# Patient Record
Sex: Female | Born: 1985 | Race: Black or African American | Hispanic: No | Marital: Single | Smoking: Current some day smoker
Health system: Southern US, Community
[De-identification: ages and names within clinical notes are randomized; demographics above are authoritative.]

## PROBLEM LIST (undated history)

## (undated) DIAGNOSIS — Z8719 Personal history of other diseases of the digestive system: Secondary | ICD-10-CM

## (undated) DIAGNOSIS — E669 Obesity, unspecified: Secondary | ICD-10-CM

## (undated) DIAGNOSIS — Z8759 Personal history of other complications of pregnancy, childbirth and the puerperium: Secondary | ICD-10-CM

## (undated) DIAGNOSIS — J45909 Unspecified asthma, uncomplicated: Secondary | ICD-10-CM

---
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
## 2004-02-13 ENCOUNTER — Inpatient Hospital Stay (HOSPITAL_COMMUNITY): Admission: AD | Admit: 2004-02-13 | Discharge: 2004-02-14 | Payer: Self-pay | Admitting: Obstetrics and Gynecology

## 2004-04-06 ENCOUNTER — Ambulatory Visit (HOSPITAL_COMMUNITY): Admission: RE | Admit: 2004-04-06 | Discharge: 2004-04-06 | Payer: Self-pay | Admitting: *Deleted

## 2004-04-13 ENCOUNTER — Ambulatory Visit (HOSPITAL_COMMUNITY): Admission: RE | Admit: 2004-04-13 | Discharge: 2004-04-13 | Payer: Self-pay | Admitting: Obstetrics & Gynecology

## 2004-04-25 ENCOUNTER — Inpatient Hospital Stay (HOSPITAL_COMMUNITY): Admission: AD | Admit: 2004-04-25 | Discharge: 2004-04-25 | Payer: Self-pay | Admitting: *Deleted

## 2004-04-27 ENCOUNTER — Encounter: Admission: RE | Admit: 2004-04-27 | Discharge: 2004-04-27 | Payer: Self-pay | Admitting: Family Medicine

## 2004-04-28 ENCOUNTER — Ambulatory Visit (HOSPITAL_COMMUNITY): Admission: RE | Admit: 2004-04-28 | Discharge: 2004-04-28 | Payer: Self-pay | Admitting: Obstetrics and Gynecology

## 2004-05-18 ENCOUNTER — Encounter: Admission: RE | Admit: 2004-05-18 | Discharge: 2004-05-18 | Payer: Self-pay | Admitting: Family Medicine

## 2004-06-08 ENCOUNTER — Inpatient Hospital Stay (HOSPITAL_COMMUNITY): Admission: AD | Admit: 2004-06-08 | Discharge: 2004-06-08 | Payer: Self-pay | Admitting: Obstetrics & Gynecology

## 2004-06-08 ENCOUNTER — Encounter: Admission: RE | Admit: 2004-06-08 | Discharge: 2004-06-08 | Payer: Self-pay | Admitting: Family Medicine

## 2004-06-09 ENCOUNTER — Inpatient Hospital Stay (HOSPITAL_COMMUNITY): Admission: RE | Admit: 2004-06-09 | Discharge: 2004-06-09 | Payer: Self-pay | Admitting: Obstetrics & Gynecology

## 2004-06-15 ENCOUNTER — Encounter: Admission: RE | Admit: 2004-06-15 | Discharge: 2004-06-15 | Payer: Self-pay | Admitting: Family Medicine

## 2004-06-18 ENCOUNTER — Inpatient Hospital Stay (HOSPITAL_COMMUNITY): Admission: AD | Admit: 2004-06-18 | Discharge: 2004-06-21 | Payer: Self-pay | Admitting: Obstetrics & Gynecology

## 2004-07-06 ENCOUNTER — Encounter: Admission: RE | Admit: 2004-07-06 | Discharge: 2004-07-06 | Payer: Self-pay | Admitting: Family Medicine

## 2004-07-23 ENCOUNTER — Ambulatory Visit: Payer: Self-pay | Admitting: Family Medicine

## 2004-07-23 ENCOUNTER — Inpatient Hospital Stay (HOSPITAL_COMMUNITY): Admission: AD | Admit: 2004-07-23 | Discharge: 2004-07-27 | Payer: Self-pay | Admitting: Family Medicine

## 2004-07-24 ENCOUNTER — Encounter (INDEPENDENT_AMBULATORY_CARE_PROVIDER_SITE_OTHER): Payer: Self-pay | Admitting: *Deleted

## 2004-08-01 ENCOUNTER — Inpatient Hospital Stay (HOSPITAL_COMMUNITY): Admission: AD | Admit: 2004-08-01 | Discharge: 2004-08-01 | Payer: Self-pay | Admitting: *Deleted

## 2005-06-17 ENCOUNTER — Emergency Department (HOSPITAL_COMMUNITY): Admission: EM | Admit: 2005-06-17 | Discharge: 2005-06-17 | Payer: Self-pay | Admitting: Emergency Medicine

## 2005-10-10 ENCOUNTER — Emergency Department (HOSPITAL_COMMUNITY): Admission: EM | Admit: 2005-10-10 | Discharge: 2005-10-10 | Payer: Self-pay | Admitting: Emergency Medicine

## 2007-02-11 ENCOUNTER — Emergency Department (HOSPITAL_COMMUNITY): Admission: EM | Admit: 2007-02-11 | Discharge: 2007-02-11 | Payer: Self-pay | Admitting: Emergency Medicine

## 2007-03-04 ENCOUNTER — Emergency Department (HOSPITAL_COMMUNITY): Admission: EM | Admit: 2007-03-04 | Discharge: 2007-03-04 | Payer: Self-pay | Admitting: Emergency Medicine

## 2007-06-02 ENCOUNTER — Inpatient Hospital Stay (HOSPITAL_COMMUNITY): Admission: AD | Admit: 2007-06-02 | Discharge: 2007-06-02 | Payer: Self-pay | Admitting: Family Medicine

## 2008-09-28 ENCOUNTER — Inpatient Hospital Stay (HOSPITAL_COMMUNITY): Admission: AD | Admit: 2008-09-28 | Discharge: 2008-09-28 | Payer: Self-pay | Admitting: Obstetrics & Gynecology

## 2008-11-16 ENCOUNTER — Ambulatory Visit (HOSPITAL_COMMUNITY): Admission: RE | Admit: 2008-11-16 | Discharge: 2008-11-16 | Payer: Self-pay | Admitting: Family Medicine

## 2008-11-18 ENCOUNTER — Ambulatory Visit (HOSPITAL_COMMUNITY): Admission: RE | Admit: 2008-11-18 | Discharge: 2008-11-18 | Payer: Self-pay | Admitting: Family Medicine

## 2008-11-18 ENCOUNTER — Ambulatory Visit: Payer: Self-pay | Admitting: Family Medicine

## 2008-11-25 ENCOUNTER — Ambulatory Visit (HOSPITAL_COMMUNITY): Admission: RE | Admit: 2008-11-25 | Discharge: 2008-11-25 | Payer: Self-pay | Admitting: Physician Assistant

## 2008-11-25 ENCOUNTER — Ambulatory Visit: Payer: Self-pay | Admitting: Obstetrics & Gynecology

## 2008-12-02 ENCOUNTER — Ambulatory Visit: Payer: Self-pay | Admitting: Obstetrics & Gynecology

## 2008-12-23 ENCOUNTER — Ambulatory Visit (HOSPITAL_COMMUNITY): Admission: RE | Admit: 2008-12-23 | Discharge: 2008-12-23 | Payer: Self-pay | Admitting: Family Medicine

## 2008-12-23 ENCOUNTER — Ambulatory Visit: Payer: Self-pay | Admitting: Family Medicine

## 2008-12-31 ENCOUNTER — Ambulatory Visit (HOSPITAL_COMMUNITY): Admission: RE | Admit: 2008-12-31 | Discharge: 2008-12-31 | Payer: Self-pay | Admitting: Family Medicine

## 2009-01-06 ENCOUNTER — Ambulatory Visit: Payer: Self-pay | Admitting: Family Medicine

## 2009-01-20 ENCOUNTER — Ambulatory Visit (HOSPITAL_COMMUNITY): Admission: RE | Admit: 2009-01-20 | Discharge: 2009-01-20 | Payer: Self-pay | Admitting: Family Medicine

## 2009-01-20 ENCOUNTER — Ambulatory Visit: Payer: Self-pay | Admitting: Family Medicine

## 2009-01-24 ENCOUNTER — Inpatient Hospital Stay (HOSPITAL_COMMUNITY): Admission: AD | Admit: 2009-01-24 | Discharge: 2009-01-24 | Payer: Self-pay | Admitting: Obstetrics & Gynecology

## 2009-02-03 ENCOUNTER — Ambulatory Visit: Payer: Self-pay | Admitting: Family Medicine

## 2009-02-09 ENCOUNTER — Inpatient Hospital Stay (HOSPITAL_COMMUNITY): Admission: AD | Admit: 2009-02-09 | Discharge: 2009-02-28 | Payer: Self-pay | Admitting: Obstetrics & Gynecology

## 2009-02-09 ENCOUNTER — Ambulatory Visit: Payer: Self-pay | Admitting: Family Medicine

## 2009-02-10 ENCOUNTER — Encounter: Payer: Self-pay | Admitting: Family Medicine

## 2009-02-24 ENCOUNTER — Encounter: Payer: Self-pay | Admitting: *Deleted

## 2009-02-27 ENCOUNTER — Encounter: Payer: Self-pay | Admitting: Obstetrics & Gynecology

## 2009-05-13 ENCOUNTER — Emergency Department (HOSPITAL_COMMUNITY): Admission: EM | Admit: 2009-05-13 | Discharge: 2009-05-13 | Payer: Self-pay | Admitting: Emergency Medicine

## 2009-09-14 ENCOUNTER — Inpatient Hospital Stay (HOSPITAL_COMMUNITY): Admission: AD | Admit: 2009-09-14 | Discharge: 2009-09-14 | Payer: Self-pay | Admitting: Obstetrics and Gynecology

## 2009-10-17 IMAGING — US US PELVIS COMPLETE MODIFY
1 series · 14 of 25 positions shown · non-contrast
Comparison: None

CLINICAL DATA: Pelvic pain and abnormal uterine bleeding.

TRANSABDOMINAL AND TRANSVAGINAL ULTRASOUND OF PELVIS
TECHNIQUE: Both transabdominal and transvaginal ultrasound
examinations of the pelvis were performed including evaluation of
the uterus, ovaries, adnexal regions, and pelvic cul-de-sac.

[Series 1: us transvaginal non-ob · 0.27mm/px · 27 acquisitions, 14 frames shown]
[im 1/27]
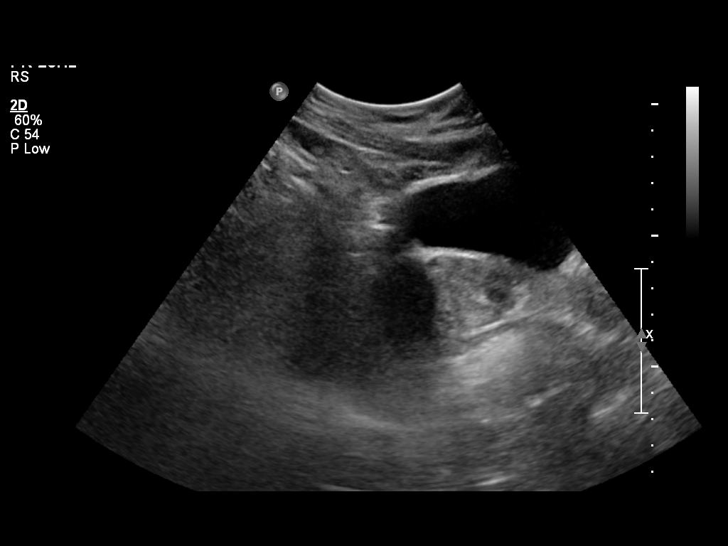
[im 3/27]
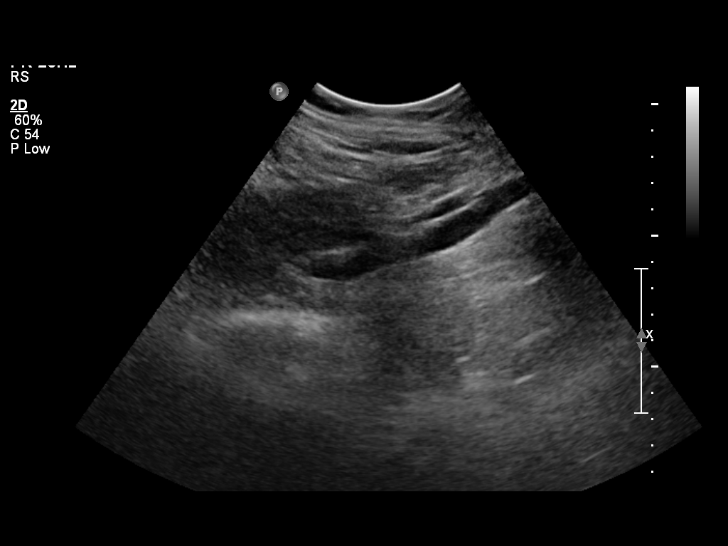
[im 5/27]
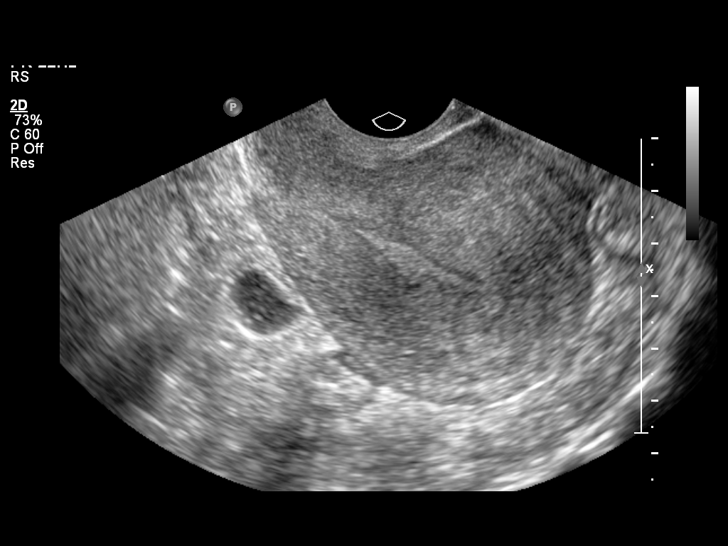
[im 7/27]
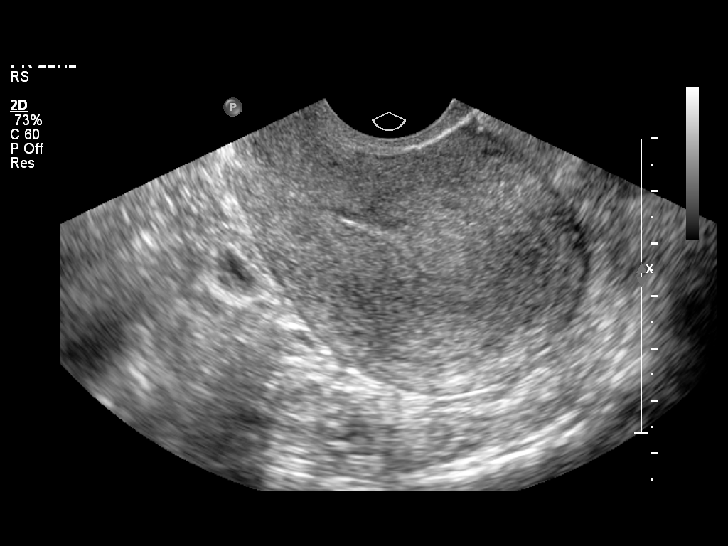
[im 9/27]
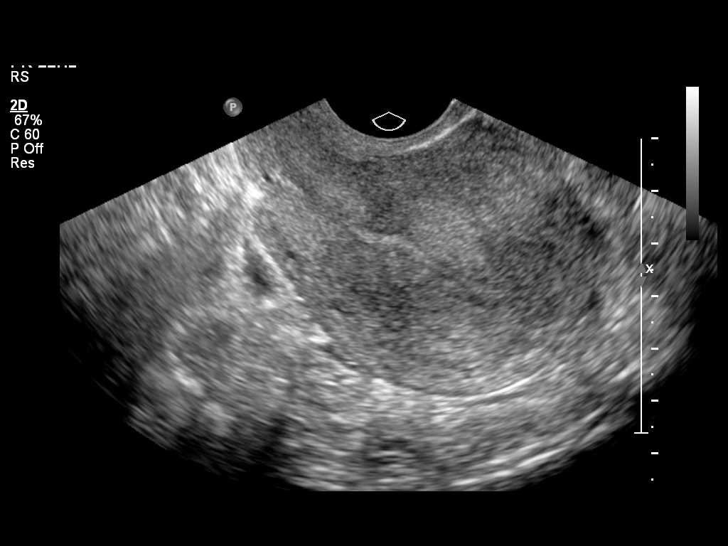
[im 10/27]
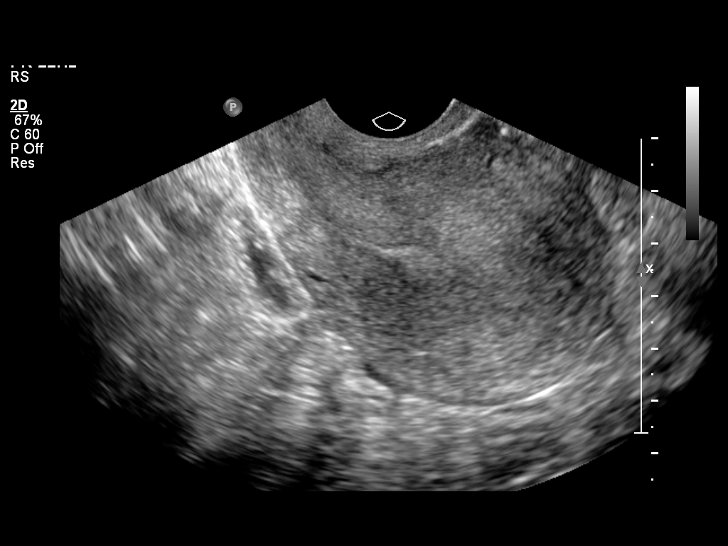
[im 12/27]
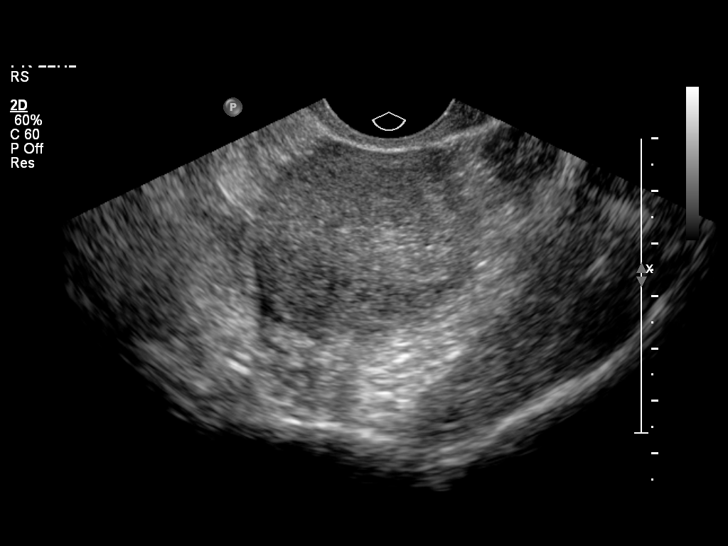
[im 15/27]
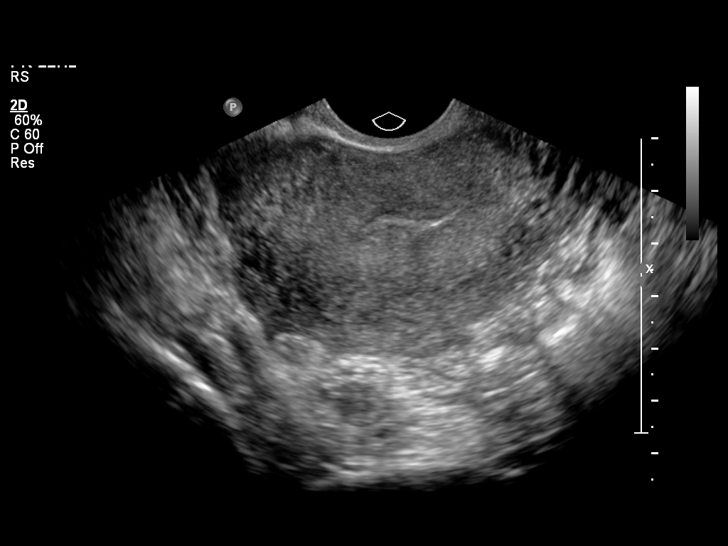
[im 17/27]
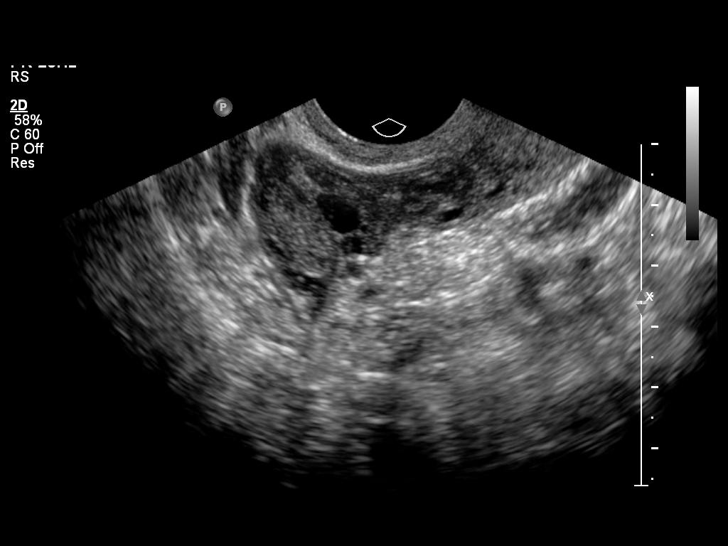
[im 18/27]
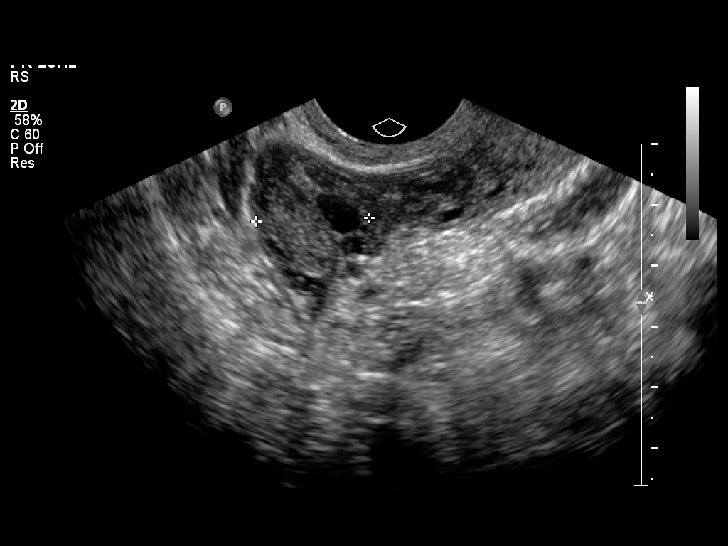
[im 20/27]
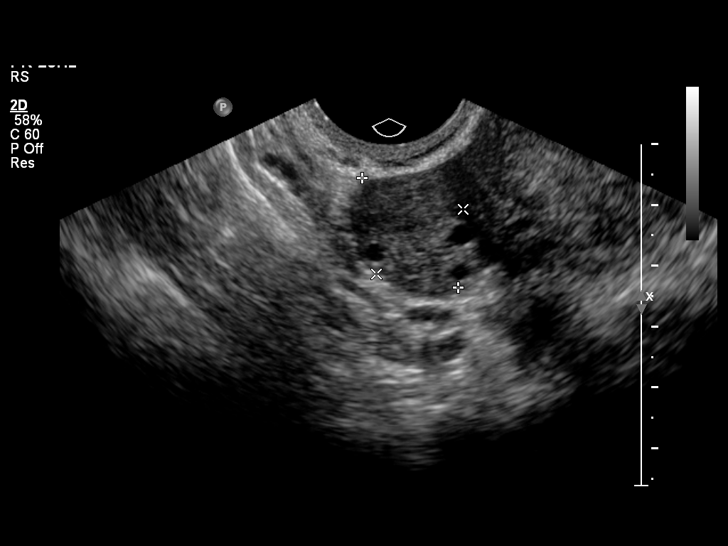
[im 22/27]
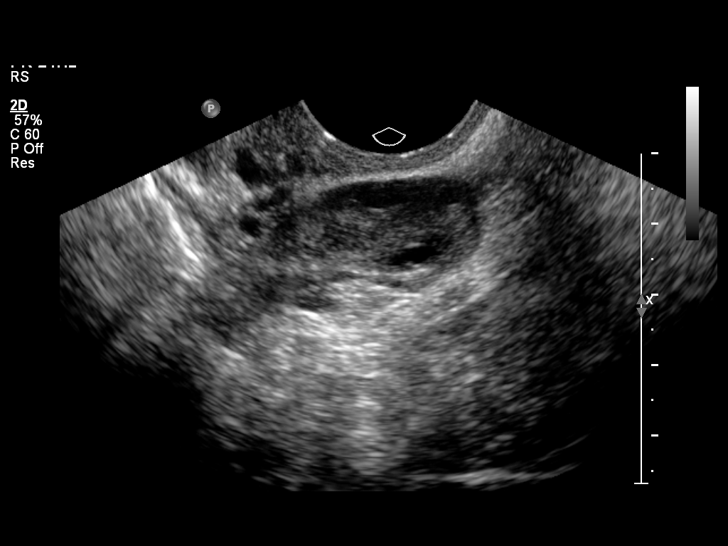
[im 24/27]
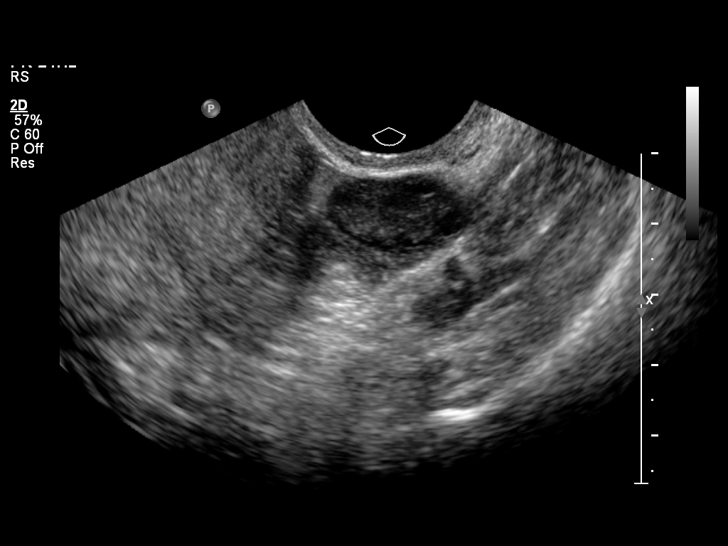
[im 27/27]
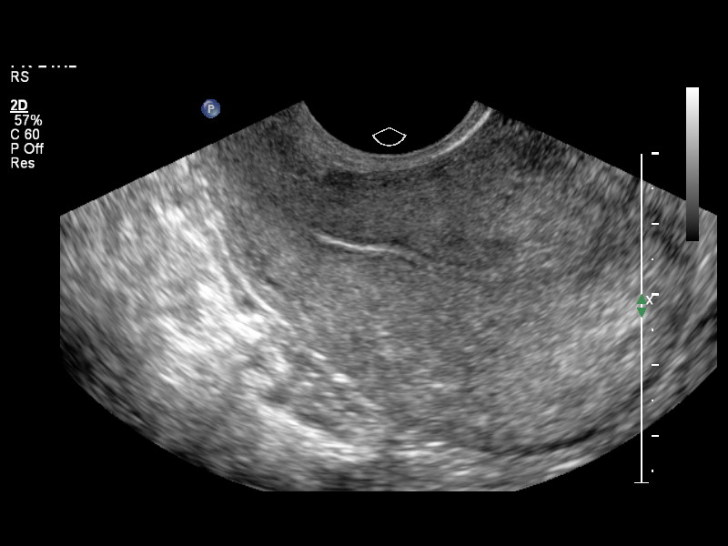

[14 of 25 positions shown; findings below may reference images not displayed]

FINDINGS: Uterus measures 7.3 x 5.2 x 6.2 cm. Uterus is retroverted.  No
fibroids or other uterine masses identified.

Endometrium measures 2 mm in thickness.  Within normal limits in
appearance.

Right Ovary measures 2.4 x 1.8 x 1.9 cm. Normal appearance.

Left Ovary measures 3.2 x 1.6 x 2.0 cm.  Normal appearance.

Other Findings:  No adnexal mass or free fluid identified.
IMPRESSION: Normal study.  No evidence of pelvic mass or other significant
abnormality.

## 2009-12-28 ENCOUNTER — Emergency Department (HOSPITAL_COMMUNITY): Admission: EM | Admit: 2009-12-28 | Discharge: 2009-12-28 | Payer: Self-pay | Admitting: Emergency Medicine

## 2010-05-02 ENCOUNTER — Other Ambulatory Visit: Admission: RE | Admit: 2010-05-02 | Discharge: 2010-05-02 | Payer: Self-pay | Admitting: Radiology

## 2010-09-27 ENCOUNTER — Inpatient Hospital Stay (HOSPITAL_COMMUNITY): Admission: AD | Admit: 2010-09-27 | Discharge: 2010-09-27 | Payer: Self-pay | Admitting: Obstetrics and Gynecology

## 2010-09-27 ENCOUNTER — Ambulatory Visit: Payer: Self-pay | Admitting: Obstetrics and Gynecology

## 2010-11-07 ENCOUNTER — Emergency Department (HOSPITAL_COMMUNITY)
Admission: EM | Admit: 2010-11-07 | Discharge: 2010-11-07 | Payer: Self-pay | Source: Home / Self Care | Admitting: Emergency Medicine

## 2011-01-07 ENCOUNTER — Encounter: Payer: Self-pay | Admitting: Family Medicine

## 2011-02-28 LAB — URINE MICROSCOPIC-ADD ON

## 2011-02-28 LAB — URINALYSIS, ROUTINE W REFLEX MICROSCOPIC
Ketones, ur: NEGATIVE mg/dL
Nitrite: NEGATIVE
Protein, ur: NEGATIVE mg/dL
Specific Gravity, Urine: 1.02 (ref 1.005–1.030)
Urobilinogen, UA: 1 mg/dL (ref 0.0–1.0)
pH: 6.5 (ref 5.0–8.0)

## 2011-02-28 LAB — URINE CULTURE

## 2011-02-28 LAB — GC/CHLAMYDIA PROBE AMP, GENITAL: GC Probe Amp, Genital: NEGATIVE

## 2011-02-28 LAB — WET PREP, GENITAL: Trich, Wet Prep: NONE SEEN

## 2011-03-04 LAB — URINALYSIS, ROUTINE W REFLEX MICROSCOPIC
Glucose, UA: NEGATIVE mg/dL
Hgb urine dipstick: NEGATIVE
Protein, ur: NEGATIVE mg/dL
pH: 7 (ref 5.0–8.0)

## 2011-03-04 LAB — URINE MICROSCOPIC-ADD ON

## 2011-03-05 ENCOUNTER — Emergency Department (HOSPITAL_COMMUNITY)
Admission: EM | Admit: 2011-03-05 | Discharge: 2011-03-06 | Disposition: A | Discharge status: Home / Self Care | Payer: Medicaid Other | Attending: Emergency Medicine | Admitting: Emergency Medicine

## 2011-03-05 DIAGNOSIS — N6009 Solitary cyst of unspecified breast: Secondary | ICD-10-CM | POA: Insufficient documentation

## 2011-03-05 DIAGNOSIS — N644 Mastodynia: Secondary | ICD-10-CM | POA: Insufficient documentation

## 2011-03-05 LAB — GRAM STAIN

## 2011-03-07 ENCOUNTER — Emergency Department (HOSPITAL_COMMUNITY)
Admission: EM | Admit: 2011-03-07 | Discharge: 2011-03-08 | Disposition: A | Discharge status: Home / Self Care | Payer: Medicaid Other | Attending: Emergency Medicine | Admitting: Emergency Medicine

## 2011-03-07 DIAGNOSIS — N61 Mastitis without abscess: Secondary | ICD-10-CM | POA: Insufficient documentation

## 2011-03-07 DIAGNOSIS — N644 Mastodynia: Secondary | ICD-10-CM | POA: Insufficient documentation

## 2011-03-17 ENCOUNTER — Emergency Department (HOSPITAL_COMMUNITY)
Admission: EM | Admit: 2011-03-17 | Discharge: 2011-03-17 | Disposition: A | Discharge status: Home / Self Care | Payer: Medicaid Other | Attending: Emergency Medicine | Admitting: Emergency Medicine

## 2011-03-17 DIAGNOSIS — J029 Acute pharyngitis, unspecified: Secondary | ICD-10-CM | POA: Insufficient documentation

## 2011-03-17 DIAGNOSIS — O99891 Other specified diseases and conditions complicating pregnancy: Secondary | ICD-10-CM | POA: Insufficient documentation

## 2011-03-17 LAB — RAPID STREP SCREEN (MED CTR MEBANE ONLY): Streptococcus, Group A Screen (Direct): NEGATIVE

## 2011-03-18 LAB — STREP A DNA PROBE: Group A Strep Probe: NEGATIVE

## 2011-03-23 LAB — URINALYSIS, ROUTINE W REFLEX MICROSCOPIC
Glucose, UA: NEGATIVE mg/dL
Ketones, ur: NEGATIVE mg/dL
Leukocytes, UA: NEGATIVE
Protein, ur: NEGATIVE mg/dL
pH: 6.5 (ref 5.0–8.0)

## 2011-03-23 LAB — GC/CHLAMYDIA PROBE AMP, GENITAL
Chlamydia, DNA Probe: NEGATIVE
GC Probe Amp, Genital: NEGATIVE

## 2011-03-23 LAB — CBC
HCT: 44.4 % (ref 36.0–46.0)
Hemoglobin: 14.8 g/dL (ref 12.0–15.0)
Platelets: 295 10*3/uL (ref 150–400)
RDW: 14.6 % (ref 11.5–15.5)
WBC: 8.9 10*3/uL (ref 4.0–10.5)

## 2011-03-23 LAB — WET PREP, GENITAL
Trich, Wet Prep: NONE SEEN
Yeast Wet Prep HPF POC: NONE SEEN

## 2011-03-23 LAB — URINE MICROSCOPIC-ADD ON

## 2011-03-25 ENCOUNTER — Inpatient Hospital Stay (HOSPITAL_COMMUNITY)
Admission: AD | Admit: 2011-03-25 | Discharge: 2011-03-28 | DRG: 781 | Disposition: A | Discharge status: Home / Self Care | Payer: Medicaid Other | Source: Ambulatory Visit | Attending: Obstetrics and Gynecology | Admitting: Obstetrics and Gynecology

## 2011-03-25 DIAGNOSIS — O99340 Other mental disorders complicating pregnancy, unspecified trimester: Secondary | ICD-10-CM | POA: Diagnosis present

## 2011-03-25 DIAGNOSIS — O34219 Maternal care for unspecified type scar from previous cesarean delivery: Secondary | ICD-10-CM | POA: Diagnosis present

## 2011-03-25 DIAGNOSIS — O368390 Maternal care for abnormalities of the fetal heart rate or rhythm, unspecified trimester, not applicable or unspecified: Secondary | ICD-10-CM | POA: Diagnosis present

## 2011-03-25 DIAGNOSIS — O4690 Antepartum hemorrhage, unspecified, unspecified trimester: Principal | ICD-10-CM | POA: Diagnosis present

## 2011-03-25 DIAGNOSIS — O4700 False labor before 37 completed weeks of gestation, unspecified trimester: Secondary | ICD-10-CM | POA: Diagnosis present

## 2011-03-25 DIAGNOSIS — F1410 Cocaine abuse, uncomplicated: Secondary | ICD-10-CM | POA: Diagnosis present

## 2011-03-25 LAB — RAPID URINE DRUG SCREEN, HOSP PERFORMED
Barbiturates: NOT DETECTED
Benzodiazepines: NOT DETECTED

## 2011-03-25 LAB — COMPREHENSIVE METABOLIC PANEL
BUN: 2 mg/dL — ABNORMAL LOW (ref 6–23)
CO2: 20 mEq/L (ref 19–32)
Calcium: 9.2 mg/dL (ref 8.4–10.5)
Chloride: 107 mEq/L (ref 96–112)
Creatinine, Ser: 0.49 mg/dL (ref 0.4–1.2)
GFR calc non Af Amer: >60 mL/min (ref >60)
Glucose, Bld: 91 mg/dL (ref 70–99)
Total Bilirubin: 0.2 mg/dL — ABNORMAL LOW (ref 0.3–1.2)

## 2011-03-25 LAB — WET PREP, GENITAL
Clue Cells Wet Prep HPF POC: NONE SEEN
Trich, Wet Prep: NONE SEEN

## 2011-03-25 LAB — TYPE AND SCREEN
ABO/RH(D): B POS
Antibody Screen: NEGATIVE

## 2011-03-25 LAB — URINALYSIS, ROUTINE W REFLEX MICROSCOPIC
Ketones, ur: NEGATIVE mg/dL
Nitrite: NEGATIVE
Protein, ur: NEGATIVE mg/dL

## 2011-03-25 LAB — CBC
Hemoglobin: 12.3 g/dL (ref 12.0–15.0)
MCH: 32.3 pg (ref 26.0–34.0)
MCHC: 34.2 g/dL (ref 30.0–36.0)
Platelets: 282 10*3/uL (ref 150–400)
RBC: 3.81 MIL/uL — ABNORMAL LOW (ref 3.87–5.11)

## 2011-03-25 LAB — URINE MICROSCOPIC-ADD ON

## 2011-03-26 ENCOUNTER — Inpatient Hospital Stay (HOSPITAL_COMMUNITY): Payer: Medicaid Other

## 2011-03-26 LAB — GC/CHLAMYDIA PROBE AMP, GENITAL
Chlamydia, DNA Probe: NEGATIVE
GC Probe Amp, Genital: NEGATIVE

## 2011-03-27 LAB — POCT PREGNANCY, URINE: Preg Test, Ur: NEGATIVE

## 2011-03-27 LAB — URINALYSIS, ROUTINE W REFLEX MICROSCOPIC
Bilirubin Urine: NEGATIVE
Glucose, UA: NEGATIVE mg/dL
Leukocytes, UA: NEGATIVE
Nitrite: NEGATIVE
Protein, ur: 30 mg/dL — AB
Specific Gravity, Urine: 1.02 (ref 1.005–1.030)
Urobilinogen, UA: 0.2 mg/dL (ref 0.0–1.0)
pH: 6 (ref 5.0–8.0)

## 2011-03-27 LAB — DIFFERENTIAL
Basophils Absolute: 0 10*3/uL (ref 0.0–0.1)
Lymphocytes Relative: 7 % — ABNORMAL LOW (ref 12–46)
Neutro Abs: 11 10*3/uL — ABNORMAL HIGH (ref 1.7–7.7)
Neutrophils Relative %: 90 % — ABNORMAL HIGH (ref 43–77)

## 2011-03-27 LAB — COMPREHENSIVE METABOLIC PANEL
BUN: 7 mg/dL (ref 6–23)
CO2: 18 mEq/L — ABNORMAL LOW (ref 19–32)
Chloride: 109 mEq/L (ref 96–112)
Creatinine, Ser: 0.9 mg/dL (ref 0.4–1.2)
GFR calc non Af Amer: >60 mL/min (ref >60)
Total Bilirubin: 1 mg/dL (ref 0.3–1.2)

## 2011-03-27 LAB — LIPASE, BLOOD: Lipase: 22 U/L (ref 11–59)

## 2011-03-27 LAB — CBC
HCT: 46.2 % — ABNORMAL HIGH (ref 36.0–46.0)
MCHC: 33.5 g/dL (ref 30.0–36.0)
MCV: 95.4 fL (ref 78.0–100.0)
Platelets: 297 10*3/uL (ref 150–400)
RBC: 4.84 MIL/uL (ref 3.87–5.11)
WBC: 12.2 10*3/uL — ABNORMAL HIGH (ref 4.0–10.5)

## 2011-03-27 LAB — URINE MICROSCOPIC-ADD ON

## 2011-03-28 LAB — STREP B DNA PROBE

## 2011-03-29 ENCOUNTER — Inpatient Hospital Stay (HOSPITAL_COMMUNITY)
Admission: AD | Admit: 2011-03-29 | Discharge: 2011-03-31 | DRG: 774 | Disposition: A | Discharge status: Home / Self Care | Payer: Medicaid Other | Source: Ambulatory Visit | Attending: Obstetrics and Gynecology | Admitting: Obstetrics and Gynecology

## 2011-03-29 ENCOUNTER — Other Ambulatory Visit: Payer: Self-pay | Admitting: Obstetrics and Gynecology

## 2011-03-29 DIAGNOSIS — O4590 Premature separation of placenta, unspecified, unspecified trimester: Principal | ICD-10-CM | POA: Diagnosis present

## 2011-03-29 DIAGNOSIS — O34219 Maternal care for unspecified type scar from previous cesarean delivery: Secondary | ICD-10-CM | POA: Diagnosis present

## 2011-03-29 LAB — GLUCOSE, CAPILLARY
Glucose-Capillary: 104 mg/dL — ABNORMAL HIGH (ref 70–99)
Glucose-Capillary: 105 mg/dL — ABNORMAL HIGH (ref 70–99)
Glucose-Capillary: 107 mg/dL — ABNORMAL HIGH (ref 70–99)
Glucose-Capillary: 109 mg/dL — ABNORMAL HIGH (ref 70–99)
Glucose-Capillary: 113 mg/dL — ABNORMAL HIGH (ref 70–99)
Glucose-Capillary: 114 mg/dL — ABNORMAL HIGH (ref 70–99)
Glucose-Capillary: 115 mg/dL — ABNORMAL HIGH (ref 70–99)
Glucose-Capillary: 116 mg/dL — ABNORMAL HIGH (ref 70–99)
Glucose-Capillary: 127 mg/dL — ABNORMAL HIGH (ref 70–99)
Glucose-Capillary: 131 mg/dL — ABNORMAL HIGH (ref 70–99)
Glucose-Capillary: 139 mg/dL — ABNORMAL HIGH (ref 70–99)
Glucose-Capillary: 143 mg/dL — ABNORMAL HIGH (ref 70–99)
Glucose-Capillary: 60 mg/dL — ABNORMAL LOW (ref 70–99)
Glucose-Capillary: 77 mg/dL (ref 70–99)
Glucose-Capillary: 81 mg/dL (ref 70–99)
Glucose-Capillary: 82 mg/dL (ref 70–99)
Glucose-Capillary: 86 mg/dL (ref 70–99)
Glucose-Capillary: 86 mg/dL (ref 70–99)
Glucose-Capillary: 93 mg/dL (ref 70–99)
Glucose-Capillary: 94 mg/dL (ref 70–99)
Glucose-Capillary: 97 mg/dL (ref 70–99)

## 2011-03-29 LAB — DIFFERENTIAL
Basophils Absolute: 0 10*3/uL (ref 0.0–0.1)
Eosinophils Absolute: 0.2 10*3/uL (ref 0.0–0.7)
Lymphocytes Relative: 8 % — ABNORMAL LOW (ref 12–46)
Monocytes Relative: 3 % (ref 3–12)
Neutro Abs: 21.2 10*3/uL — ABNORMAL HIGH (ref 1.7–7.7)
Neutrophils Relative %: 88 % — ABNORMAL HIGH (ref 43–77)

## 2011-03-29 LAB — URINALYSIS, ROUTINE W REFLEX MICROSCOPIC
Glucose, UA: NEGATIVE mg/dL
Protein, ur: NEGATIVE mg/dL
Specific Gravity, Urine: 1.015 (ref 1.005–1.030)
Urobilinogen, UA: 0.2 mg/dL (ref 0.0–1.0)

## 2011-03-29 LAB — URINALYSIS, MICROSCOPIC ONLY
Bilirubin Urine: NEGATIVE
Ketones, ur: NEGATIVE mg/dL
Leukocytes, UA: NEGATIVE
Nitrite: NEGATIVE
Protein, ur: NEGATIVE mg/dL
Urobilinogen, UA: 0.2 mg/dL (ref 0.0–1.0)

## 2011-03-29 LAB — CBC
HCT: 36.5 % (ref 36.0–46.0)
Hemoglobin: 13.1 g/dL (ref 12.0–15.0)
MCH: 32.5 pg (ref 26.0–34.0)
MCHC: 33.9 g/dL (ref 30.0–36.0)
MCHC: 34.1 g/dL (ref 30.0–36.0)
MCHC: 34 g/dL (ref 30.0–36.0)
MCV: 95.5 fL (ref 78.0–100.0)
Platelets: 287 10*3/uL (ref 150–400)
RBC: 3.67 MIL/uL — ABNORMAL LOW (ref 3.87–5.11)
RDW: 14.1 % (ref 11.5–15.5)
RDW: 14.3 % (ref 11.5–15.5)
RDW: 13.6 % (ref 11.5–15.5)

## 2011-03-29 LAB — URINALYSIS, DIPSTICK ONLY
Bilirubin Urine: NEGATIVE
Leukocytes, UA: NEGATIVE
Nitrite: NEGATIVE
Specific Gravity, Urine: 1.025 (ref 1.005–1.030)
Urobilinogen, UA: 0.2 mg/dL (ref 0.0–1.0)
pH: 7 (ref 5.0–8.0)

## 2011-03-29 LAB — URINE MICROSCOPIC-ADD ON

## 2011-03-29 LAB — RAPID URINE DRUG SCREEN, HOSP PERFORMED: Tetrahydrocannabinol: NOT DETECTED

## 2011-03-30 LAB — CBC
Hemoglobin: 11.3 g/dL — ABNORMAL LOW (ref 12.0–15.0)
MCH: 32 pg (ref 26.0–34.0)
RBC: 3.53 MIL/uL — ABNORMAL LOW (ref 3.87–5.11)

## 2011-03-30 LAB — RPR: RPR Ser Ql: NONREACTIVE

## 2011-04-02 LAB — POCT URINALYSIS DIP (DEVICE)
Bilirubin Urine: NEGATIVE
Bilirubin Urine: NEGATIVE
Glucose, UA: NEGATIVE mg/dL
Hgb urine dipstick: NEGATIVE
Hgb urine dipstick: NEGATIVE
Ketones, ur: NEGATIVE mg/dL
Specific Gravity, Urine: 1.02 (ref 1.005–1.030)
Specific Gravity, Urine: 1.015 (ref 1.005–1.030)
pH: 7 (ref 5.0–8.0)

## 2011-04-02 NOTE — Discharge Summary (Signed)
NAMEJARETZY, [name]             ACCOUNT NO.:  [id number]  MEDICAL RECORD NO.:  [id number]           PATIENT TYPE:  I  LOCATION:  9305                          [facility]  PHYSICIAN:  Huel Cote, M.D. DATE OF BIRTH:  [birth year]  DATE OF ADMISSION:  03/29/2011 DATE OF DISCHARGE:  03/31/2011                              DISCHARGE SUMMARY   DISCHARGE DIAGNOSES: 1. Preterm pregnancy at 32-3/7 weeks. 2. Status post vaginal bleeding partial abruption prior to this     admission. 3. Active labor. 4. Vaginal birth after cesarean section.  DISCHARGE MEDICATIONS: 1. Motrin 600 mg. 2. Percocet 1-2 tablets p.o. every 4 h. p.r.n.  DISCHARGE FOLLOWUP:  The patient is to follow up in 6 weeks for her full postpartum exam and a probable Mirena placement.  HOSPITAL COURSE:  The patient is a 25 year old G3, P0-2-0-1 who is admitted at 32-3/7 weeks' gestation in active labor.  The patient had been admitted prior to this was a vaginal bleeding partial abruption which had stabilized and had been discharged home.  She presented to the office today with contractions and some cervical change to 3-cm and shortly thereafter began to have an increase in her contractions and cervix progressed to 5-6 cm.  She was given the option of a vaginal birth after C-section versus a repeat C-section and desired to proceed with a VBAC which she had successfully done prior.  The patient did have a positive drug screen this pregnancy for cocaine which she stated was from a borrowed cigarette.  PAST OBSTETRICAL HISTORY:  Significant for severe preeclampsia and delivery at 32 weeks.  This was a cesarean section of 3-pound, 9- ounce infant in 2005 for a nonreassuring fetal heart rate.  In 2010, she had a 26-week delivery after having rupture of membranes at 12 weeks' gestation.  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  Her prenatal care this pregnancy was significant for the prior early deliveries.  She also had a history of bipolar disorder and depression, multiple pelvic infections including gonorrhea, Chlamydia and Trichomonas.  This pregnancy only positive for Trichomonas.  She had a history of cocaine and marijuana abuse and did test positive for cocaine the week prior to admission for delivery.  She had history of smoking.  PAST MEDICAL HISTORY:  Significant for the bipolar and depression and history of a recurring left breast abscess.  PAST SURGICAL HISTORY:  Low transverse C-section and I and D of a breast abscess.  PAST OBSTETRICAL HISTORY:  She had a C-section at 32 weeks as stated and a successful VBAC of a 26-week or thereafter.  PAST GYNECOLOGIC HISTORY:  History of abnormal Pap smear and low-grade SIL changes.  She also had a history of multiple pelvic infections with Trichomonas only noted this pregnancy.  MEDICATIONS:  None.  ALLERGIES:  None.  SOCIAL HISTORY:  Positive for cocaine and marijuana and a smoker.  FAMILY HISTORY:  Bipolar disorder and she herself has this as well.  PRENATAL LABORATORY DATA:  B positive, antibody  negative, RPR nonreactive, rubella immune, hepatitis B surface antigen negative, HIV negative, GC negative, Chlamydia negative.  Hemoglobin electrophoresis normal.  Initial urine drug screen was negative.  However, she did have a positive cocaine screen later.  First trimester screen normal. Glucola 128.  Shortly after admission, the patient received ampicillin for her group B strep prophylaxis given a preterm delivery.  She progressed quickly to complete dilation and delivered a viable female infant, Apgars were 8 and 8, weight was 4 pounds 9 ounces.  Placenta was expressed intact.  There is no bilateral labial lacerations which she was repaired of the left for hemostasis and the first-degree  perineal laceration repaired with 3-0 Vicryl Rapide.  She was then admitted for routine postpartum care.  Baby went to NICU, but was doing well on room air.  Her repeat drug screen on this admission was negative.  Her discharge hemoglobin was 11.3.  On postpartum day #2, she was doing quite well and the pain was controlled with Motrin and Percocet.  She was felt stable for discharge home and was discharged to home with followup planned in the office in 6 weeks and a Mirena placement.  She had normal lochia and her fundus was firm on discharge and the baby was stable at the NICU.     Huel Cote, M.D.     KR/MEDQ  D:  03/31/2011  T:  04/01/2011  Job:  [id number]  Electronically Signed by Huel Cote M.D. on 04/01/2011 10:47:49 AM

## 2011-04-03 LAB — POCT URINALYSIS DIP (DEVICE)
Hgb urine dipstick: NEGATIVE
Ketones, ur: NEGATIVE mg/dL
Protein, ur: 30 mg/dL — AB
Protein, ur: 30 mg/dL — AB
Specific Gravity, Urine: 1.02 (ref 1.005–1.030)
Specific Gravity, Urine: 1.02 (ref 1.005–1.030)
Urobilinogen, UA: 0.2 mg/dL (ref 0.0–1.0)
pH: 7 (ref 5.0–8.0)

## 2011-04-03 LAB — STREP B DNA PROBE

## 2011-04-04 NOTE — Discharge Summary (Signed)
[name], [name]             ACCOUNT NO.:  [id number]  MEDICAL RECORD NO.:  [id number]           PATIENT TYPE:  I  LOCATION:  9151                          [facility]  PHYSICIAN:  Sherron Monday, MD        DATE OF BIRTH:  08-29-1986  DATE OF ADMISSION:  03/25/2011 DATE OF DISCHARGE:  03/28/2011                              DISCHARGE SUMMARY   ADMITTING DIAGNOSES:  Intrauterine pregnancy at 31 plus weeks with vaginal bleeding and cervical dilatation.  DISCHARGE DIAGNOSES:  Intrauterine pregnancy at 31 plus weeks with vaginal bleeding and cervical dilatation.  HISTORY OF PRESENT ILLNESS:  A 25 year old G3, P 0-2-0-1 at 31-6/7 by 8- week ultrasound with EDC of May 21, 2011, presents with complaint of vaginal bleeding, questionable contractions.  Evaluation in the MAU revealed cervix exam to be 3 cm dilated, some bloody discharge and regular contractions.  On prenatal care, she has had nausea and vomiting of pregnancy and nipple abscess, but please see her prenatal records for complete history.  PAST MEDICAL HISTORY:  Significant for LGSIL bipolar, Chlamydia, condylomata, gonorrhea and Trichomonas.  SURGICAL HISTORY:  Significant for low transverse cesarean section.  PAST OBSTETRIC AND GYNECOLOGIC HISTORY:  G1 in 2005, low transverse cesarean section, 32 weeks, 3 pounds 9 ounces, severe preeclampsia, small for gestational age and nonreassuring fetal heart tracing.  G2 was in 2010.  VBAC at 26 weeks after PPROM at 12 weeks.  This infant died at 3 hours of life.  MEDICATIONS:  Zofran.  ALLERGIES:  No known drug allergies.  SOCIAL HISTORY:  Denies alcohol or drug use.  She does smoke and had a cocaine-laced cigarette at a party.  PRENATAL LABORATORY FINDINGS:  She is B positive, antibody screen negative, rubella immune, hepatitis B surface antigen negative.  First trimester screen within normal limits.  AFP within normal limits. Glucola 128.  On admission, exam was  benign with moderate blood in her vault and the cervix 3 cm dilated, 50% effaced at a -2 station.  She was admitted, put on magnesium for CP prophylaxis.  She got betamethasone.  The decision was made if her labor progressed to proceed with cesarean section.  On hospital day #2, she had an occasional late deceleration with rare contraction, positive UDS after laced cigarette.  She had a social work consult, in which they discussed plan of care given the history of the positive UDS, continued to be monitored but she had again late deceleration.  An ultrasound was performed revealing no obvious abruption and good growth.  The patient continued to be monitored with occasional, likely lates after was thought to be contractions.  The patient decided that she would like to consider a VBAC given her increased vaginal dilatation.  She was given Procardia for some contractions overnight.  Protonix for GERD.  She will be discharged to home with routine discharge instructions and numbers to call with any prescriptions as well as a prescription for Protonix.  She was written to be excused from work.  Overall her sugars been very reassuring in the 140s and reactive with rare contractions.  She will be discharged to  home with routine discharge instructions and numbers to call with any questions or problems.  Follow up Monday in the office.     Sherron Monday, MD     JB/MEDQ  D:  03/28/2011  T:  03/29/2011  Job:  [id number]  Electronically Signed by Sherron Monday MD on 04/04/2011 04:32:13 PM

## 2011-05-01 NOTE — H&P (Signed)
NAMEVERNIE, [name]             ACCOUNT NO.:  [id number]   MEDICAL RECORD NO.:  [id number]          PATIENT TYPE:  INP   LOCATION:                                [facility]   PHYSICIAN:  Norton Blizzard, MD    DATE OF BIRTH:  Nov 19, 1986   DATE OF ADMISSION:  02/09/2009  DATE OF DISCHARGE:                              HISTORY & PHYSICAL   REASON FOR ADMISSION:  Preterm premature rupture of membranes.   HISTORY OF PRESENT ILLNESS:  [name] is a 25 year old  gravida 2, para 0-1-0-1 who is at 58 and 0/7th weeks gestational age  based on an ultrasound performed at 5 weeks corresponding with an  estimated due date of June 01, 2009.  Her prenatal course is complicated  by a rupture of membranes, which occurred at a 11 weeks.  Despite being  consistently found to have an hydramnios on sequential ultrasounds, her  pregnancy continued.  Her most recent ultrasound on the January 20, 2009, revealed estimated fetal weight corresponding at the 30th  percentile and the amniotic fluid index of 2.96, anterior placenta above  the cervical os and fetus in his cephalic presentation.  The visualized  anatomy has all been normal.  The patient has seen Maternal Fetal  Medicine on multiple occasions and it has been counseled on extremely  poor prognosis for this pregnancy.  Additionally of note, the ultrasound  does reveal asymmetric growth, but the abdominal circumference is  significantly smaller than the femur length and head circumference.   PAST MEDICAL HISTORY:  Depression and bipolar disorder.  She has a  previous psychiatric hospitalizations for those issues.  She has not  been on medications for several months prior to her becoming pregnant.   PAST SURGICAL HISTORY:  Cesarean section.   PAST OB HISTORY:  Preterm delivery by cesarean section due to  preeclampsia and fetal distress.   PAST GYN HISTORY:  Multiple sexually transmitted diseases as well as an  abnormal Pap  smear.   MEDICATIONS:  Prenatal vitamins.   ALLERGIES:  NKDA.   SOCIAL HISTORY:  She smokes few cigarettes per day.  She denies alcohol  use.   FAMILY HISTORY:  Diabetes.   REVIEW OF SYSTEMS:  Negative as per history of present illness.   PHYSICAL EXAMINATION:  VITAL SIGNS:  Bblood pressure today is 134/82,  heart rate is 80, and respiratory rate is 18.  GENERAL:  Healthy-appearing in no acute distress.  NECK:  Supple without any adenopathy.  LUNGS:  Clear to auscultation.  CARDIOVASCULAR:  Regular rate.  No murmur.  ABDOMEN:  Soft and gravid with a fundal height of 24 cm appropriate for  gestational age.  EXTREMITIES:  No edema.  She has normal reflexes and no clonus.   ASSESSMENT AND PLAN:  1. Preterm premature rupture of membranes at 11 weeks.  2. Anhydramnios.  3. Asymmetric fetal growth.  4. History of previous cesarean section.  The patient will be admitted at [redacted] weeks gestational age and given  betamethasone, given a 1 week course of IV antibiotics as well as  routine  monitoring.  She will need serial growth scan and group B strep  testing.  It is very likely that she will remain hospitalized until she  delivers.      Odie Sera, DO  Electronically Signed     ______________________________  Norton Blizzard, MD    MC/MEDQ  D:  02/03/2009  T:  02/04/2009  Job:  [id number]

## 2011-05-01 NOTE — Letter (Signed)
December 31, 2008    Shelbie Proctor. Shawnie Pons, M.D.  [address]  [place], [place] [address]   RE:   TAVIANA, WESTERGREN  MR#   [id number]  ACC#        [id number]   MFM CONSULTATION REPORT   Dear Dr. Shawnie Pons:   Thank you for referring your patient, [name], for maternal  fetal medicine consultation.  As you are aware, Ms. Port is a 25-year-  old gravida 2 para 0-1-0-1 at 18 weeks and 2 days gestation based on an  early ultrasound done on November 16, 2008.  As you are also aware, Ms.  Breit has likely experienced preterm premature rupture of the  membranes.  A decreased amount of fluid has been noted around her fetus  since approximately 11 weeks 6 days gestation.  She has had several  follow-up ultrasounds since that time which have shown persistently low  or absent amniotic fluid.  Additionally, Ms. Poulton continues to  experience watery vaginal discharge.  She underwent an ultrasound on  December 23, 2008, which revealed appropriate growth of her fetus and a  normal, but limited, fetal anatomic survey.   Ms. Brugger was feeling well today and had no specific complaints.  She  denied vaginal bleeding, uterine cramping or fevers and described  ongoing symptoms of loss of fluid per vagina.   Ms. Marasco has no significant past medical or past surgical history.  Her only medication is prenatal vitamins.  She has no known drug  allergies.  Ms. Kunda past obstetric history includes one prior  pregnancy which she reports to have been complicated by increased blood  pressures around the time of delivery.  She states she was induced at [redacted]  weeks gestation and then subsequently underwent a cesarean section  because of preeclampsia and a nonreassuring fetal heart rate tracing.  Her prenatal records indicate a hospitalization in 2000 for psychiatric  indications as well as a history of therapy with  __________.  She is  currently not taking this medication, has not followed up with her  psychiatric care providers and reported stable moods today.   Ms. Whitehair reports having abnormal Pap smears in the past but denies  any history of LEEP or cone biopsy.  She has had no GYN surgeries.     Ms. Marquina does have a history of Chlamydia and gonorrhea in the past  and prenatal cultures have been performed.   Ms. Calais smokes approximately 5 cigarettes daily.  She denies the use  of alcohol or street drugs.  She also denies any history of genetic  disease or congenital anomalies in either her or the father of the  baby's family.  She believes she has been tested for sickle cell trait  and that she is negative.  A formal report of this was not available for  review today.   On exam today, Ms. Hassan appeared anxious.  Her blood pressure is  121/75, her pulse was 93 beats per minute and her weight was 202 pounds.  Ms. Gaba underwent an ultrasound today which revealed a singleton  fetus in cephalic presentation.  Anhydramnios was confirmed.  The fetal  kidneys and bladder appeared within normal limits.  The remainder of the  fetal anatomy was poorly seen due to the anhydramnios and maternal body  habitus, but no specific anomalies were identified.   The finding of anhydramnios at this gestational age was discussed with  Ms. Cheese at length.  The most likely etiology of  this is preterm  premature rupture of the membranes giving her ongoing symptoms.  The  overall extremely poor prognosis for this pregnancy was emphasized with  Ms. Kocurek including the ongoing risks of maternal infection,  hemorrhage and labor.  The strong association of lack of amniotic fluid  at this gestational age with pulmonary hypoplasia, a conditioning  compatible with life was also reviewed with Ms. Dobratz.  The fact that  she has had oligohydramnios for a prolonged period of time and the  increased risk that this brings her fetus was reviewed.  The options of  continued expectant management  vs. a D&E vs. pregnancy termination were  reviewed.  The option of pregnancy termination was presented due to the  extremely poor prognosis and methods of D&E vs. Cytotec induction were  described.  The benefits of the Cytotec induction of less surgical risk  as well as the ability to see the fetus and have an autopsy performed  were described, as well as the benefits of D&E in which a potentially  long hospitalization could be avoided.  The risks of bleeding,  hemorrhage and retained tissue with both procedures were reviewed as  well as the need for D&C and a small percentage of patients undergoing  Cytotec induction.   The risks with ongoing pregnancy were also reviewed, including serious  maternal infection, including and a possible need for hysterectomy in a  small number of cases.  The possibility of labor before a viable  gestational age was also discussed.  Should Ms. Urias elect expectant  management, would recommend hospital admission at viability  (approximately 24 weeks' gestation) until delivery.  We would also  recommend  close surveillance for the development of intraamniotic  infection and labor.  Ms. Manton expressed understanding and acceptance  of this information and wished to consider her options further.  She  plans to discuss with you at her follow-up visit in 1 week.  The limit  for termination of pregnancy in [place], of 20 weeks' gestation,  was also discussed with Ms. Tapper.  If she remains undelivered, we  would recommend a repeat ultrasound in approximately 2-3 weeks to  evaluate fetal growth and anatomy.  Precautions were given Ms. Ackley  to closely observe for signs of fever, uterine tenderness, uterine  cramping and vaginal bleeding as well as purulent vaginal discharge.  She reported understand and will contact you should any of these  develop.  Thank you for allowing [place] to participate in the care of Ms.  Dezeeuw.  Please free to contact us  at any time regarding this or any  other patient you may have.  Sincerely,   Sincerely,      Heather L. Rachel Bo, MD  Maternal Fetal Medicine  [facility]     HLM/MEDQ  D:  12/31/2008  T:  12/31/2008  Job:  [id number]

## 2011-05-01 NOTE — Discharge Summary (Signed)
[name], [name]             ACCOUNT NO.:  [id number]   MEDICAL RECORD NO.:  [id number]          PATIENT TYPE:  INP   LOCATION:  9318                          [facility]   PHYSICIAN:  Tanya S. Shawnie Pons, M.D.   DATE OF BIRTH:  September 14, 1986   DATE OF ADMISSION:  02/09/2009  DATE OF DISCHARGE:  02/28/2009                               DISCHARGE SUMMARY   REASON FOR ADMISSION:  Pregnancy at 24 weeks with premature rupture of  membranes at [redacted] weeks gestation.   DISCHARGE DIAGNOSIS:  Pregnancy at 26 weeks and 4 days.  Postpartum day  #1, had spontaneous vaginal delivery of a nonviable female infant that  died approximately 3 hours after birth.   HOSPITAL COURSE:  [name] has been here since February 09, 2009, on  bedrest and monitoring.  She had an uneventful stay, was afebrile the  entire hospitalization.  Yesterday on February 27, 2009, she started  spontaneous labor at approximately 5 a.m.  She had a spontaneous vaginal  delivery in the afternoon without complications.  The baby was at that  time viable female infant which was transferred up to the NICU and died  shortly after admitting up to the NICU.   PHYSICAL EXAMINATION:  HEART:  Today, regular to rhythm and rate.  LUNGS:  Clear to auscultation bilaterally.  ABDOMEN:  Fundus is firm.  Lochia small amount.  Abdomen is soft and  nontender.  EXTREMITIES:  There is no edema in the lower extremities.  DTRs are  trace.   She desires Depo-Provera as her contraceptive and she is going to follow  up at [facility] in 6 weeks.   DISCHARGE MEDICATIONS:  1. Motrin 800 one p.o. q.8 h. p.r.n. cramping.  2. Percocet 5/325 one p.o. q.4 h. p.r.n. pain.  3. Ativan 1 mg p.o. q.6 h. p.r.n. anxiety.      Zerita Boers, N.M.      Shelbie Proctor. Shawnie Pons, M.D.  Electronically Signed    DL/MEDQ  D:  [id number]/[id number]  T:  03/01/2009  Job:  [id number]   cc:   Scheryl Darter, MD

## 2011-05-04 NOTE — Discharge Summary (Signed)
[name], [name]                       ACCOUNT NO.:  [id number]   MEDICAL RECORD NO.:  [id number]                   PATIENT TYPE:  INP   LOCATION:  9154                                 [facility]   PHYSICIAN:  Lesly Dukes, M.D.              DATE OF BIRTH:  [birth year]   DATE OF ADMISSION:  06/18/2004  DATE OF DISCHARGE:  06/21/2004                                 DISCHARGE SUMMARY   ADMISSION DIAGNOSIS:  Threatened preterm labor.   DISCHARGE DIAGNOSIS:  Cessation of contractions.   DISCHARGE MEDICATIONS:  1. Terbutaline 2.5 mg p.o. q.4h.  2. Prenatal vitamins one tablet p.o. daily.   ADMISSION HISTORY:  [name] is an 25 year old G1 that presented to the  MAU at 40 and 6 weeks.  The patient complained of abdominal pain.  Physical  exam was within normal limits except for digital cervix exam revealed a  loose cervix at 1 cm, 50% dilated, -2 station, questionable breech.  The  patient was admitted to the antenatal unit for monitoring.   HOSPITAL COURSE:  The patient received continuous monitoring while on the  antenatal unit.  The patient was started on terbutaline 2.5 p.o. q.4h. to  control contractions.  The patient was also on Unasyn for preterm labor.  The patient had previously received two betamethasone shots on June 23 and  June 09, 2004 in the high risk clinic.  The patient's cultures for GBS, GC,  and chlamydia were all negative.  The patient was screened for proteinuria.  A 24-hour collection showed 45 protein.  The patient's white blood count was  23 on admission.  By discharge on June 21, 2004 the patient's WBC had  decreased to 15.9.  The patient also received an ultrasound on June 19, 2004  which revealed a single fetus in vertex position, fetal heart rate of 142  beats per minute, positive fetal movement, positive fetal breathing  identified, amniotic fluid of 11.4 cm.  Cervix was measured at 3.2  transabdominally.  Estimated gestational age of [redacted]  weeks 4 days.   CONDITION ON DISCHARGE:  Stable and without contractions.   Preterm labor precautions were given to the patient.  She voiced  understanding.  The patient is being discharged on terbutaline 2.5 mg by  mouth q.4h. and also prenatal vitamins one tablet by mouth once daily.  The  patient will follow up in high risk clinic on tomorrow for previously-  scheduled appointment.  The patient will return if condition changes.     Bonnita Hollow, M.D.               Lesly Dukes, M.D.   VRE/MEDQ  D:  06/21/2004  T:  06/21/2004  Job:  [id number]   cc:   Jon Gills, M.D.  Fax: [phone]   Lesly Dukes, M.D.

## 2011-05-04 NOTE — Op Note (Signed)
[name], [name]                       ACCOUNT NO.:  [id number]   MEDICAL RECORD NO.:  [id number]                   PATIENT TYPE:  INP   LOCATION:  9157                                 FACILITY:  [facility]   PHYSICIAN:  Tanya S. Shawnie Pons, M.D.                DATE OF BIRTH:  04-30-1986   DATE OF PROCEDURE:  07/24/2004  DATE OF DISCHARGE:                                 OPERATIVE REPORT   PREOPERATIVE DIAGNOSES:  Intrauterine pregnancy at 32-6/7 weeks, severe pre-  eclampsia based on blood pressure, nonreassuring fetal heart rate.   POSTOPERATIVE DIAGNOSES:  Intrauterine pregnancy at 32-6/7 weeks, severe pre-  eclampsia based on blood pressure, nonreassuring fetal heart rate.   OPERATION PERFORMED:  Primary low transverse cesarean section.   SURGEON:  Shelbie Proctor. Shawnie Pons, M.D.   ASSISTANT:  Caren Griffins, C.N.M.   ANESTHESIA:  General endotracheal tube.   ANESTHESIOLOGIST:  Raul Del, M.D.   ESTIMATED BLOOD LOSS:  1000 mL.   FINDINGS:  Viable infant.  Apgars 5 and 8, 1636 g, pH 7.03.   INDICATIONS FOR PROCEDURE:  The patient is an 25 year old G1 who presented  at 32-5/7 weeks with lower abdominal cramping.  She was found to be  contracting frequently and her cervix was at least 1 50%, -1.  At the same  time she had multiple elevated blood pressures.  __________ labs revealed a  uric acid of 5.4, creatinine 0.8.  Other __________ labs were within normal  limits.  She does have positive fetal fibronectin.  Ultrasound revealed low  fluid at 8.1 and grade 3 placenta.   DESCRIPTION OF PROCEDURE:  The patient was admitted for observation , 24-  hour urine.  She previously had betamethasone last week.  The patient after  being admitted in the evening went on to develop more painful contractions,  worsening elevated blood pressures and nonreassuring fetal heart rate  tracing.  The patient was given terbutaline for some fetal heart rate which  had been 110s, came back to its normal  baseline but still having __________  variables with every contraction. It was felt that the baby would not  sustain this pattern through labor.  Decision made to proceed to the OR.   The patient was taken to the operating room where attempt was made to do  spinal times two lasting about three minutes.  When a spinal could note be  placed easily, the patient was then put to sleep.  She was prepped and  draped.  The decision was made to use general anesthesia.  She was then  prepped and draped in the usual sterile fashion.  As anesthesia was being  induced, 10 mL of 0.25% Marcaine were used to inject the incision.  Once the  endotracheal tube was down, a knife was used to make a Pfannenstiel incision  in the lower abdomen.  This was carried down to fascia which was divided in  midline.  This incision extended bluntly.  The fascia was dissected off the  rectus muscle superiorly until the division between the recess could be  found and the peritoneal cavity entered sharply.  This incision was extended  by the surgeon and assistant pulling on either side of the incision.  The  bladder blade was placed inside the incision and a knife used to make a low  transverse incision on the uterus.  The lower uterine segment was very thin  and the amniotic fluid was clear.  Once the uterine cavity was entered in  the midline, this was extended laterally, bluntly.  The infant was in a  vertex position and was delivered easily.  After the uterine cavity was  entered the cord came out.  The infant was delivered easily and the cord was  wrapped around both shoulders.  The cord was clamped times two and cut and  the infant taken to waiting peds.  Cord pH and cord blood were obtained.  The placenta was delivered manually and the uterus cleaned with a dry lap  pad.  The edges of the incision were grasped with ring forceps and the  uterine incision closed with a 0 Vicryl suture in locked running fashion.  A   second imbricating layer was then used for strength of incision.  The  incision was found to be hemostatic except for a few bleeders down on the  bladder flap which were cauterized with the electrocautery.  Attention was  then turned to the fascia which was closed with a 0 Vicryl suture in a  running fashion.  The subcutaneous tissue was irrigated and any bleeders  were cauterized and the skin closed using clips.  All sponge, lap, needle  and instruments were all correct times two.  The patient was then awakened  and taken to recovery room in stable condition.                                               Shelbie Proctor. Shawnie Pons, M.D.    TSP/MEDQ  D:  07/24/2004  T:  07/24/2004  Job:  [id number]

## 2011-05-04 NOTE — Discharge Summary (Signed)
[name], [name]                       ACCOUNT NO.:  [id number]   MEDICAL RECORD NO.:  [id number]                   PATIENT TYPE:  INP   LOCATION:  9307                                 [facility]ACILITY:  [facility]   PHYSICIAN:  [name], M.D.                DATE OF BIRTH:  October 19, 1986   DATE OF ADMISSION:  07/23/2004  DATE OF DISCHARGE:                                 DISCHARGE SUMMARY   PRIMARY CARE PHYSICIAN:  [facility].   ADMITTING DIAGNOSES:  1. Preterm labor.  2. Preeclampsia.  3. Low transverse cesarean section secondary to preeclampsia and     nonreassuring fetal heart rate.   PROCEDURES:  Low transverse C-section on July 24, 2004 by Dr. Shawnie Johnston  secondary to severe PIH and nonreassuring fetal heart rate.   LABORATORY RESULTS:  Admission PIH panel:  White count 14.4, hemoglobin  11.5, hematocrit 33.9, platelet count of 248.  Sodium 134, potassium 3.7,  chloride 103, bicarb 21, glucose 86, BUN 3, creatinine 0.8, T bili 0.4, alk  phos of 181, AST of 27, ALT of 14, total protein 6.3, albumin 2.7, uric acid  5.4, LDH of 222.  Negative fetal fibronectin.  UA was negative for protein.  Wet prep was positive for a few clue cells as well as a few white blood  cells.   Magnesium levels after starting a magnesium drip secondary to preeclampsia  were 4.8, then 5.6, then 6.1.   Repeat PIH panel on August 9 showed white count 10.5, hemoglobin 11.0,  hematocrit of 33.4, platelet count of 221.  Sodium 130, potassium 3.5,  chloride of 104, bicarb 24, glucose 115, BUN of 4, creatinine of 0.9 and  stable, T bili of 0.2, alk phos of 167, AST of 30, ALT of 13, total protein  5.4, uric acid of 5.4 and stable, LDH of 274 and stable.   ADMISSION HISTORY AND PHYSICAL:  The patient is an 25 year old G1 P0 at 13  and five-sevenths weeks by a 17-week 2-day ultrasound with estimated date of  confinement September 12, 2004 who presented to the MAU on July 23, 2004  with uterine  contractions all day long every 12 minutes, denying any rupture  of membranes, no vaginal bleeding, normal fetal activity.  Prenatal course:  Source of care was [facility] and then later at the high risk clinic.  Pregnancy complications:  The patient has had multiple STDs including  Trichomonas, HPV, GC, and chlamydia throughout the pregnancy.  Medications:  The patient was on prenatal vitamins.  Allergies:  No known drug allergies.  Obstetrical history:  G1.  Gynecological history:  History of STDs.  Medical  history:  None.  Surgical history:  None.  Social history:  Admits to  alcohol abuse, marijuana and cocaine use; however, the last use was in  January 2005.  Family history positive for hypertension and diabetes.  Admission physical exam:  The patient was  afebrile on admission.  However,  blood pressure on admission was 141/92; repeat values of 148/89 and a repeat  value of 150/89 concerning for preeclampsia.  The rest of the physical exam  is as follows:  General:  No apparent distress, alert and oriented x3.  Heart:  Regular rate and rhythm; no murmurs, rubs, or gallops.  Lungs:  Clear to auscultation bilaterally.  Abdomen:  Soft, gravid, nontender.  Extremities:  No edema.  Neurologic:  Deep tendon reflexes 2+, no clonus.  Digital cervix exam was loose 1, 50% effaced, -1 station.  Fetal heart rate  was baseline in the 140s with moderate variability and a reactive strip, no  decelerations.  Toco shows irregular contractions every 1.5 to 3 minutes.  Labs on admission:  UA was grossly negative.  Wet prep was negative.  See  above laboratory results as far as the [facility] panel.  Assessment and plan:  The  patient was deemed to be in threatened preterm labor as well as evolving  preeclampsia.  She was admitted for a PIH panel, a 24-hour urine, and blood  pressure monitoring.   HOSPITAL COURSE:  The patient was admitted for observation.  However, she  continued to have extensive  cramping.  No tocolytics were used, given the  elevated blood pressures, at this point in the 150-160 range over 85-110.  Labor was not stopped.  Cervical change was noted.  The cervix on next  examination on the morning of July 24, 2004 was 2 cm dilated, approximately  2 cm long, and -1 station with bulging bag.  The decision was made at this  point not to tocolyze, given her blood pressures.  However, on the next  check at 5:30 a.m. that morning the baby's heart rate dropped to 110-115  with repetitive variable decelerations.  These moderate to severe variables  continued even in the face of terbutaline.  At that point the patient was  taken back for a primary low transverse C-section given nonreassuring fetal  heart rate as well as severe pregnancy-induced hypertension.  A viable female  infant was born with Apgars at one minute of 5 and at five minutes an Apgar  of 8.  The baby was taken to the NICU, given low birth weight as well as  preterm status.  The patient continued to do well overnight.  She was placed  on magnesium for possible preeclampsia.  Magnesium levels are as previously  dictated in the laboratory section.  She was therapeutic on the magnesium,  diuresed well, was asymptomatic, no headaches, no epigastric pain, no  blurred vision.  Repeat PIH panel was obtained as dictated in the laboratory  section which showed no evidence of any evolving preeclampsia.  A 24-hour  urine was also collected for total protein.  However, given the fact that  the patient was asymptomatic and the repeat PIH panel showed no worsening of  laboratory values, the patient's magnesium was stopped.  In the next 2 days  postoperative, blood pressures off magnesium were in the 110s and 70s.  The  following day they were in the 120s to 130s over 80s to 90s.  On the morning  of discharge the highest blood pressure she had had was 138/90.  At that point decision was made to begin the patient on  hydrochlorothiazide 25 mg  one p.o. daily for likely PIH.  The patient remained asymptomatic - no  headaches, no blurred vision, no epigastric tenderness.  The patient's  wishes are  to breast and bottle feed the infant.  She does request a  circumcision.  She also requests the Depo shot prior to discharge.   DISCHARGE INSTRUCTIONS:  The patient is instructed to follow up at the  [facility] in 6 weeks for a postoperative check.  We would  appreciate if [facility] would continue her Depo birth  control.  We would also appreciate it if the [facility] would  follow up her blood pressures and the success of hydrochlorothiazide in  controlling her blood pressures.  The patient is likely PIH and may not  require the hydrochlorothiazide in the future.   DISCHARGE MEDICATIONS:  1. Prenatal vitamin one p.o. daily.  2. Ibuprofen 600 mg one p.o. q.8h. p.r.n. pain.  3. The patient was given an injection of Depo-Provera prior to discharge for     birth control.  4. The patient was given Percocet 5/325 mg one to two p.o. q.4-6h. p.r.n.     for pain.  5. The patient was also given Colace 100 mg one p.o. b.i.d. until she began     having bowel movements.  6. The patient was also given hydrochlorothiazide 25 mg one p.o. daily for     elevated blood pressure, likely PIH.   ADDENDUM:  The patient is instructed to return to maternity admissions on  Monday, July 31, 2004 for staple removal.     Broadus John T. Pamalee Leyden, MD                  Shelbie Proctor Shawnie Johnston, M.D.    Alvia Grove  D:  07/27/2004  T:  07/27/2004  Job:  [id number]   cc:   [facility]

## 2011-09-17 LAB — ABO/RH: ABO/RH(D): B POS

## 2011-09-17 LAB — GC/CHLAMYDIA PROBE AMP, GENITAL: GC Probe Amp, Genital: NEGATIVE

## 2011-09-17 LAB — URINALYSIS, ROUTINE W REFLEX MICROSCOPIC
Hgb urine dipstick: NEGATIVE
Nitrite: NEGATIVE
Specific Gravity, Urine: 1.02
Urobilinogen, UA: 0.2

## 2011-09-17 LAB — CBC
HCT: 44
MCV: 97.4
RBC: 4.51
WBC: 10.2

## 2011-09-17 LAB — HCG, QUANTITATIVE, PREGNANCY: hCG, Beta Chain, Quant, S: 3722 — ABNORMAL HIGH

## 2011-09-17 LAB — WET PREP, GENITAL: Trich, Wet Prep: NONE SEEN

## 2011-09-21 LAB — POCT URINALYSIS DIP (DEVICE)
Bilirubin Urine: NEGATIVE
Bilirubin Urine: NEGATIVE
Glucose, UA: NEGATIVE mg/dL
Hgb urine dipstick: NEGATIVE
Hgb urine dipstick: NEGATIVE
Hgb urine dipstick: NEGATIVE
Ketones, ur: NEGATIVE mg/dL
Ketones, ur: NEGATIVE mg/dL
Protein, ur: 100 mg/dL — AB
Protein, ur: NEGATIVE mg/dL
Specific Gravity, Urine: 1.01 (ref 1.005–1.030)
Specific Gravity, Urine: 1.02 (ref 1.005–1.030)
Specific Gravity, Urine: <=1.005 (ref 1.005–1.030)
Urobilinogen, UA: 0.2 mg/dL (ref 0.0–1.0)
pH: 6 (ref 5.0–8.0)
pH: 7 (ref 5.0–8.0)

## 2011-10-03 LAB — URINALYSIS, ROUTINE W REFLEX MICROSCOPIC
Glucose, UA: NEGATIVE
Hgb urine dipstick: NEGATIVE
Ketones, ur: NEGATIVE
Protein, ur: NEGATIVE
pH: 6

## 2011-10-03 LAB — POCT PREGNANCY, URINE: Preg Test, Ur: NEGATIVE

## 2012-08-06 ENCOUNTER — Emergency Department (HOSPITAL_COMMUNITY)
Admission: EM | Admit: 2012-08-06 | Discharge: 2012-08-06 | Disposition: A | Discharge status: Home / Self Care | Payer: Medicaid Other | Attending: Emergency Medicine | Admitting: Emergency Medicine

## 2012-08-06 ENCOUNTER — Encounter (HOSPITAL_COMMUNITY): Payer: Self-pay | Admitting: Emergency Medicine

## 2012-08-06 DIAGNOSIS — F17200 Nicotine dependence, unspecified, uncomplicated: Secondary | ICD-10-CM | POA: Insufficient documentation

## 2012-08-06 DIAGNOSIS — N644 Mastodynia: Secondary | ICD-10-CM | POA: Insufficient documentation

## 2012-08-06 MED ORDER — HYDROCODONE-ACETAMINOPHEN 5-325 MG PO TABS: 2.0000 | ORAL_TABLET | ORAL | Status: AC | PRN

## 2012-08-06 MED ORDER — CEPHALEXIN 250 MG PO CAPS
500.0000 mg | ORAL_CAPSULE | Freq: Once | ORAL | Status: AC
  Administered 2012-08-06: 500 mg via ORAL
  Filled 2012-08-06: qty 2

## 2012-08-06 MED ORDER — CEPHALEXIN 500 MG PO CAPS: 500.0000 mg | ORAL_CAPSULE | Freq: Four times a day (QID) | ORAL | Status: AC

## 2012-08-06 MED ORDER — HYDROCODONE-ACETAMINOPHEN 5-325 MG PO TABS
2.0000 | ORAL_TABLET | Freq: Once | ORAL | Status: AC
  Administered 2012-08-06: 2 via ORAL
  Filled 2012-08-06: qty 2

## 2012-08-06 NOTE — ED Notes (Signed)
Pt c/o abscess to left breast; pt sts hx of same

## 2012-08-06 NOTE — ED Provider Notes (Signed)
History   This chart was scribed for Doug Sou, MD by Charolett Bumpers . The patient was seen in room TR07C/TR07C. Patient's care was started at 1943.    CSN: [id number]  Arrival date & time 08/06/12  1632   First MD Initiated Contact with Patient 08/06/12 1943      Chief Complaint  Patient presents with  . Abscess    (Consider location/radiation/quality/duration/timing/severity/associated sxs/prior treatment) HPI [name] is a 26 y.o. female who presents to the Emergency Department complaining of constant, moderate worsening left nipple abscess with associated pain over the past 3 days. Pt reports a h/o similar problems last year while she was pregnant in which she had it drained. Pt states that she previously had a biopsy at the breast center with normal results. Pt denies any other injuries or complaints of pain at this time. Pt denies any pertinent medical and surgical hx. Pt denies alcohol use. Pt reports tobacco use.    History reviewed. No pertinent past medical history.  History reviewed. No pertinent past surgical history.  History reviewed. No pertinent family history.  History  Substance Use Topics  . Smoking status: Current Everyday Smoker  . Smokeless tobacco: Not on file  . Alcohol Use: No    OB History    Grav Para Term Preterm Abortions TAB SAB Ect Mult Living                  Review of Systems  Constitutional: Negative.   HENT: Negative.   Respiratory: Negative.   Cardiovascular: Negative.   Gastrointestinal: Negative.   Musculoskeletal: Negative.   Skin: Negative.        Pain and redness to left nipple.   Neurological: Negative.   Hematological: Negative.   Psychiatric/Behavioral: Negative.   All other systems reviewed and are negative.    Allergies  Review of patient's allergies indicates no known allergies.  Home Medications  No current outpatient prescriptions on file.  BP 138/78  Pulse 92  Temp 98.4 F (36.9 C)  (Oral)  Resp 18  SpO2 100%  Physical Exam  Nursing note and vitals reviewed. Constitutional: She appears well-developed and well-nourished.  HENT:  Head: Normocephalic and atraumatic.  Eyes: Conjunctivae are normal. Pupils are equal, round, and reactive to light.  Neck: Neck supple. No tracheal deviation present. No thyromegaly present.  Cardiovascular: Normal rate.   Pulmonary/Chest: Effort normal and breath sounds normal.       Left breast nipple nipple appears normal, mildly red and tender around areola. No definite fluctuance. No discharge. no axillary nodes  Abdominal: Soft. Bowel sounds are normal. She exhibits no distension. There is no tenderness.  Musculoskeletal: Normal range of motion. She exhibits no edema and no tenderness.  Neurological: She is alert. Coordination normal.  Skin: Skin is warm and dry. No rash noted.  Psychiatric: She has a normal mood and affect.    ED Course  Procedures (including critical care time)  DIAGNOSTIC STUDIES: Oxygen Saturation is 100% on room air, normal by my interpretation.    COORDINATION OF CARE:   1949-Discussed planned course of treatment with the patient including starting the pt on abx, who is agreeable at this time.    Labs Reviewed - No data to display No results found.   No diagnosis found.    MDM  Case discussed with Dr. Chilton Si Plan prescription Keflex, Norco. Breast center to contact patient tomorrow for further diagnostic workup. Breast ultrasound ordered by me and to be checked  on by Dr. Chilton Si Diagnosis left breast pain differential diagnosis includes mastitis versus abscess    I personally performed the services described in this documentation, which was scribed in my presence. The recorded information has been reviewed and considered.      Doug Sou, MD 08/06/12 2009

## 2012-08-08 ENCOUNTER — Ambulatory Visit
Admission: RE | Admit: 2012-08-08 | Discharge: 2012-08-08 | Disposition: A | Discharge status: Home / Self Care | Payer: Medicaid Other | Source: Ambulatory Visit | Attending: Emergency Medicine | Admitting: Emergency Medicine

## 2012-08-08 ENCOUNTER — Other Ambulatory Visit: Payer: Self-pay | Admitting: Emergency Medicine

## 2012-08-08 DIAGNOSIS — N644 Mastodynia: Secondary | ICD-10-CM

## 2012-08-08 DIAGNOSIS — N611 Abscess of the breast and nipple: Secondary | ICD-10-CM

## 2012-11-15 ENCOUNTER — Encounter (HOSPITAL_COMMUNITY): Payer: Self-pay | Admitting: *Deleted

## 2012-11-15 ENCOUNTER — Emergency Department (HOSPITAL_COMMUNITY)
Admission: EM | Admit: 2012-11-15 | Discharge: 2012-11-15 | Disposition: A | Discharge status: Home / Self Care | Payer: Medicaid Other | Attending: Emergency Medicine | Admitting: Emergency Medicine

## 2012-11-15 DIAGNOSIS — R05 Cough: Secondary | ICD-10-CM | POA: Insufficient documentation

## 2012-11-15 DIAGNOSIS — H109 Unspecified conjunctivitis: Secondary | ICD-10-CM | POA: Insufficient documentation

## 2012-11-15 DIAGNOSIS — E669 Obesity, unspecified: Secondary | ICD-10-CM | POA: Insufficient documentation

## 2012-11-15 DIAGNOSIS — J3489 Other specified disorders of nose and nasal sinuses: Secondary | ICD-10-CM | POA: Insufficient documentation

## 2012-11-15 DIAGNOSIS — R059 Cough, unspecified: Secondary | ICD-10-CM | POA: Insufficient documentation

## 2012-11-15 DIAGNOSIS — F17200 Nicotine dependence, unspecified, uncomplicated: Secondary | ICD-10-CM | POA: Insufficient documentation

## 2012-11-15 DIAGNOSIS — J029 Acute pharyngitis, unspecified: Secondary | ICD-10-CM

## 2012-11-15 DIAGNOSIS — R1310 Dysphagia, unspecified: Secondary | ICD-10-CM | POA: Insufficient documentation

## 2012-11-15 DIAGNOSIS — IMO0001 Reserved for inherently not codable concepts without codable children: Secondary | ICD-10-CM | POA: Insufficient documentation

## 2012-11-15 HISTORY — DX: Obesity, unspecified: E66.9

## 2012-11-15 LAB — RAPID STREP SCREEN (MED CTR MEBANE ONLY): Streptococcus, Group A Screen (Direct): NEGATIVE

## 2012-11-15 MED ORDER — POLYMYXIN B-TRIMETHOPRIM 10000-0.1 UNIT/ML-% OP SOLN
2.0000 [drp] | OPHTHALMIC | Status: DC
  Administered 2012-11-15: 2 [drp] via OPHTHALMIC
  Filled 2012-11-15: qty 10

## 2012-11-15 NOTE — ED Provider Notes (Signed)
History   This chart was scribed for Ethelda Chick, MD scribed by Magnus Sinning. The patient was seen in room TR11C/TR11C at 13:48   CSN: [id number]  Arrival date & time 11/15/12  1251    Chief Complaint  Patient presents with  . Sore Throat    (Consider location/radiation/quality/duration/timing/severity/associated sxs/prior treatment) HPI Comments: [name] is a 26 y.o. female who presents to the Emergency Department complaining of constant moderate sore throat with associated trouble swallowing due to sore throat pain. The patient states that she tried an OTC medication for treatment of ST with minimal relief. Patient also has complaints of yellow-orange colored eye discharge upon waking up, along with difficulty opening her eyes. She notes that both of her kids have recently had pink-eye.  Patient is a 26 y.o. female presenting with pharyngitis. The history is provided by the patient. No language interpreter was used.  Sore Throat This is a new problem. The current episode started more than 2 days ago. The problem occurs constantly. The problem has not changed since onset.Pertinent negatives include no shortness of breath. Nothing relieves the symptoms. The treatment provided mild relief.    Past Medical History  Diagnosis Date  . Obesity     History reviewed. No pertinent past surgical history.  History reviewed. No pertinent family history.  History  Substance Use Topics  . Smoking status: Current Every Day Smoker  . Smokeless tobacco: Not on file  . Alcohol Use: No   Review of Systems  HENT: Positive for congestion and trouble swallowing.   Respiratory: Positive for cough. Negative for shortness of breath.   Musculoskeletal: Positive for myalgias.  All other systems reviewed and are negative.    Allergies  Review of patient's allergies indicates no known allergies.  Home Medications   Current Outpatient Rx  Name  Route  Sig  Dispense  Refill  .  CHLORPHEN-PSEUDOEPHED-APAP 2-30-500 MG PO TABS   Oral   Take 1 tablet by mouth every 4 (four) hours as needed. For cold symptoms           BP 152/87  Pulse 90  Temp 98.1 F (36.7 C) (Oral)  Resp 16  SpO2 98%  LMP 10/17/2012  Physical Exam  Nursing note and vitals reviewed. Constitutional: She is oriented to person, place, and time. She appears well-developed and well-nourished. No distress.  HENT:  Head: Normocephalic and atraumatic.       Mild erythema of oropharynx. Palate is symmetric and uvula is midline.   Eyes: Conjunctivae normal and EOM are normal.       Mild bilateral conjunctival injection  EOM are full without pain. No surrounding erythema of her eyelids  Neck: Neck supple. No tracheal deviation present.  Cardiovascular: Normal rate.   Pulmonary/Chest: Effort normal. No respiratory distress.  Abdominal: She exhibits no distension.  Musculoskeletal: Normal range of motion.  Neurological: She is alert and oriented to person, place, and time. No sensory deficit.  Skin: Skin is dry.  Psychiatric: She has a normal mood and affect. Her behavior is normal.    ED Course  Procedures (including critical care time) DIAGNOSTIC STUDIES: Oxygen Saturation is 98% on room air, normal by my interpretation.    COORDINATION OF CARE:   Labs Reviewed  RAPID STREP SCREEN   No results found.   1. Viral pharyngitis   2. Conjunctivitis       MDM  Pt presenting with sore throat and body aches over the past week.  Also  concerned about pinkeye- her children have recently had this as well.  Rapid strep negative.  Eyes with mild bilateral conjunctivitis.  Suspect more likely viral conjunctivitis.  Given polytrim drops, also advised warm compresses three times daily as well.  Discharged with strict return precautions.  Pt agreeable with plan.   I personally performed the services described in this documentation, which was scribed in my presence. The recorded information has  been reviewed and is accurate.          Ethelda Chick, MD 11/15/12 [phone]

## 2012-11-15 NOTE — ED Notes (Signed)
Pt reports congestion and sore throat x 1 week. Airway intact.

## 2012-11-15 NOTE — ED Notes (Signed)
Patient discharged with instructions using tach back method. She verbalized and understanding.

## 2012-11-19 ENCOUNTER — Encounter (HOSPITAL_COMMUNITY): Payer: Self-pay | Admitting: Emergency Medicine

## 2012-11-19 ENCOUNTER — Emergency Department (HOSPITAL_COMMUNITY)
Admission: EM | Admit: 2012-11-19 | Discharge: 2012-11-19 | Disposition: A | Discharge status: Home / Self Care | Payer: Medicaid Other | Attending: Emergency Medicine | Admitting: Emergency Medicine

## 2012-11-19 DIAGNOSIS — H53149 Visual discomfort, unspecified: Secondary | ICD-10-CM | POA: Insufficient documentation

## 2012-11-19 DIAGNOSIS — J029 Acute pharyngitis, unspecified: Secondary | ICD-10-CM

## 2012-11-19 DIAGNOSIS — H109 Unspecified conjunctivitis: Secondary | ICD-10-CM

## 2012-11-19 DIAGNOSIS — E669 Obesity, unspecified: Secondary | ICD-10-CM | POA: Insufficient documentation

## 2012-11-19 DIAGNOSIS — H5789 Other specified disorders of eye and adnexa: Secondary | ICD-10-CM | POA: Insufficient documentation

## 2012-11-19 DIAGNOSIS — J3489 Other specified disorders of nose and nasal sinuses: Secondary | ICD-10-CM | POA: Insufficient documentation

## 2012-11-19 DIAGNOSIS — F17200 Nicotine dependence, unspecified, uncomplicated: Secondary | ICD-10-CM | POA: Insufficient documentation

## 2012-11-19 LAB — RAPID STREP SCREEN (MED CTR MEBANE ONLY): Streptococcus, Group A Screen (Direct): NEGATIVE

## 2012-11-19 MED ORDER — CETIRIZINE-PSEUDOEPHEDRINE ER 5-120 MG PO TB12: 1.0000 | ORAL_TABLET | Freq: Every day | ORAL | Status: DC

## 2012-11-19 MED ORDER — FAMOTIDINE 20 MG PO TABS: 20.0000 mg | ORAL_TABLET | Freq: Two times a day (BID) | ORAL | Status: DC

## 2012-11-19 MED ORDER — GUAIFENESIN ER 600 MG PO TB12: 1200.0000 mg | ORAL_TABLET | Freq: Two times a day (BID) | ORAL | Status: DC

## 2012-11-19 MED ORDER — OMEPRAZOLE 20 MG PO CPDR: 20.0000 mg | DELAYED_RELEASE_CAPSULE | Freq: Every day | ORAL | Status: DC

## 2012-11-19 MED ORDER — AMOXICILLIN 500 MG PO CAPS: 500.0000 mg | ORAL_CAPSULE | Freq: Three times a day (TID) | ORAL | Status: DC

## 2012-11-19 NOTE — ED Provider Notes (Signed)
History     CSN: [id number]  Arrival date & time 11/19/12  0132   First MD Initiated Contact with Patient 11/19/12 0148      Chief Complaint  Patient presents with  . Sore Throat   HPI  History provided by the patient. Patient is a 26 year old female with no significant PMH who presents with complaints of persistent sore throat for the past 2 weeks. Symptoms have been waxing and waning patient states pain seems worse over the past 2 days. Patient also complaining of left eye redness and irritation with drainage. Patient was seen for similar symptoms recently and given antibiotic eyedrops to use. Patient states she has been using these. She denies any other associated symptoms. Symptoms have also been associated with some nasal congestion and sinus pressures. She denies any cough symptoms. Denies any fever, chills or sweats. No episodes of nausea vomiting.   Past Medical History  Diagnosis Date  . Obesity     History reviewed. No pertinent past surgical history.  History reviewed. No pertinent family history.  History  Substance Use Topics  . Smoking status: Current Every Day Smoker    Types: Cigarettes  . Smokeless tobacco: Not on file  . Alcohol Use: No    OB History    Grav Para Term Preterm Abortions TAB SAB Ect Mult Living                  Review of Systems  Constitutional: Negative for fever, chills and diaphoresis.  HENT: Positive for congestion and sore throat.   Eyes: Positive for photophobia, discharge, redness and itching. Negative for pain and visual disturbance.  Respiratory: Negative for cough.   Cardiovascular: Negative for chest pain.  Gastrointestinal: Negative for nausea, vomiting, abdominal pain, diarrhea and constipation.  All other systems reviewed and are negative.    Allergies  Review of patient's allergies indicates no known allergies.  Home Medications  No current outpatient prescriptions on file.  BP 137/97  Pulse 86  Temp 98.7 F  (37.1 C) (Oral)  Resp 16  Ht 5\' 4"  (1.626 m)  Wt 230 lb (104.327 kg)  BMI 39.48 kg/m2  SpO2 99%  LMP 10/19/2012  Physical Exam  Nursing note and vitals reviewed. Constitutional: She is oriented to person, place, and time. She appears well-developed and well-nourished. No distress.  HENT:  Head: Normocephalic.       Is mild diffuse erythema of pharynx. Tonsils normal in size without exudate. Uvula midline.  Eyes: EOM are normal. Pupils are equal, round, and reactive to light.       Left conjunctiva is injected and erythematous with mild edema. There is also some purulent drainage.  Neck: Normal range of motion. Neck supple.       No meningeal signs  Cardiovascular: Normal rate and regular rhythm.   Pulmonary/Chest: Effort normal and breath sounds normal. No respiratory distress. She has no wheezes. She has no rales.  Abdominal: Soft. There is no tenderness. There is no rebound.  Lymphadenopathy:    She has no cervical adenopathy.  Neurological: She is alert and oriented to person, place, and time.  Skin: Skin is warm and dry. No rash noted.  Psychiatric: She has a normal mood and affect. Her behavior is normal.    ED Course  Procedures   Results for orders placed during the hospital encounter of 11/19/12  RAPID STREP SCREEN      Component Value Range   Streptococcus, Group A Screen (Direct) NEGATIVE  NEGATIVE  1. Sore throat   2. Conjunctivitis       MDM  Patient seen and evaluated. Patient well-appearing in no acute distress. Patient does not appear severely ill or toxic.        Phill Mutter Willis, [place] 11/20/12 [phone]

## 2012-11-19 NOTE — ED Notes (Signed)
Patient with sore throat for last two weeks, getting worse since she was seen two days ago.   Left eye is also sensative to light, tearing.

## 2012-11-20 NOTE — ED Provider Notes (Signed)
Medical screening examination/treatment/procedure(s) were performed by non-physician practitioner and as supervising physician I was immediately available for consultation/collaboration.    Vida Roller, MD 11/20/12 2013

## 2013-06-06 ENCOUNTER — Emergency Department (HOSPITAL_COMMUNITY)
Admission: EM | Admit: 2013-06-06 | Discharge: 2013-06-06 | Disposition: A | Discharge status: Home / Self Care | Payer: Medicaid Other | Source: Home / Self Care | Attending: Emergency Medicine | Admitting: Emergency Medicine

## 2013-06-06 ENCOUNTER — Encounter (HOSPITAL_COMMUNITY): Payer: Self-pay | Admitting: Emergency Medicine

## 2013-06-06 DIAGNOSIS — L0291 Cutaneous abscess, unspecified: Secondary | ICD-10-CM

## 2013-06-06 MED ORDER — OXYCODONE-ACETAMINOPHEN 5-325 MG PO TABS: ORAL_TABLET | ORAL | Status: DC

## 2013-06-06 MED ORDER — MUPIROCIN 2 % EX OINT: TOPICAL_OINTMENT | Freq: Three times a day (TID) | CUTANEOUS | Status: DC

## 2013-06-06 MED ORDER — SULFAMETHOXAZOLE-TMP DS 800-160 MG PO TABS: 2.0000 | ORAL_TABLET | Freq: Two times a day (BID) | ORAL | Status: DC

## 2013-06-06 NOTE — ED Provider Notes (Signed)
Chief Complaint:   Chief Complaint  Patient presents with  . Abscess    History of Present Illness:    [name] is a 27 year old female who has had a three-day history of a painful boil in her right groin area. This is draining a small amount of pus. She's had a history of boils before on her breast but none in the groin area. She denies any fever or chills. This is tender to touch.  Review of Systems:  Other than noted above, the patient denies any of the following symptoms: Systemic:  No fever, chills or sweats. Skin:  No rash or itching.  PMFSH:  Past medical history, family history, social history, meds, and allergies were reviewed.  No history of diabetes or prior history of MRSA.   Physical Exam:   Vital signs:  LMP 05/13/2013 Skin:  There was a 1.5 cm, raised, tender abscess in the right groin area. There was no purulent drainage.  Skin exam was otherwise normal.  No rash. Ext:  Distal pulses were full, patient has full ROM of all joints.  Procedure:  Verbal informed consent was obtained.  The patient was informed of the risks and benefits of the procedure and understands and accepts.  Identity of the patient was verified verbally and by wristband.   The abscess area described above was prepped with Betadine and alcohol and anesthetized with 5 mL of 2% Xylocaine with epinephrine.  Using a #11 scalpel blade, a singe straight incision was made into the area of fluctulence, yielding a no prurulent drainage.  Routine cultures were obtained.  Blunt dissection was used to break up loculations and the resulting wound cavity was packed with 1/4 inch Iodoform gauze.  A sterile pressure dressing was applied.  Assessment:  The encounter diagnosis was Abscess.  I wasn't able to get much so this, but didn't pack it and start her on antibiotics. I also suggested mupirocin ointment to the nostrils twice daily for a month, and Clorox baths twice weekly for prevention.  Plan:   1.  The  following meds were prescribed:   New Prescriptions   MUPIROCIN OINTMENT (BACTROBAN) 2 %    Apply topically 3 (three) times daily.   OXYCODONE-ACETAMINOPHEN (PERCOCET) 5-325 MG PER TABLET    1 to 2 tablets every 6 hours as needed for pain.   SULFAMETHOXAZOLE-TRIMETHOPRIM (BACTRIM DS) 800-160 MG PER TABLET    Take 2 tablets by mouth 2 (two) times daily.   2.  The patient was instructed in symptomatic care and handouts were given. 3.  The patient was instructed to leave the dressing in place and return again in 48 hours for packing removal.  Given red flag symptoms such as fever that would indicate earlier return.   Reuben Likes, MD 06/06/13 [phone]

## 2013-06-06 NOTE — ED Notes (Signed)
Pt c/o abscess posterior to right leg/thigh area onset Thursday... Gradually getting bigger, painful... Denies: fevers, drainage... She is alert and oriented w/no signs of acute distress.

## 2013-06-08 ENCOUNTER — Encounter (HOSPITAL_COMMUNITY): Payer: Self-pay | Admitting: Emergency Medicine

## 2013-06-08 ENCOUNTER — Emergency Department (INDEPENDENT_AMBULATORY_CARE_PROVIDER_SITE_OTHER)
Admission: EM | Admit: 2013-06-08 | Discharge: 2013-06-08 | Disposition: A | Discharge status: Home / Self Care | Payer: Medicaid Other | Source: Home / Self Care | Attending: Emergency Medicine | Admitting: Emergency Medicine

## 2013-06-08 DIAGNOSIS — L0291 Cutaneous abscess, unspecified: Secondary | ICD-10-CM

## 2013-06-08 DIAGNOSIS — Z4802 Encounter for removal of sutures: Secondary | ICD-10-CM

## 2013-06-08 MED ORDER — OXYCODONE-ACETAMINOPHEN 5-325 MG PO TABS: ORAL_TABLET | ORAL | Status: DC

## 2013-06-08 NOTE — ED Notes (Signed)
At bedside when packing removed by dr Lorenz Coaster

## 2013-06-08 NOTE — ED Notes (Signed)
Right inner thigh wound is painful.  Patient had wound i/d and packed.  Patient reports no improvement.

## 2013-06-08 NOTE — ED Notes (Signed)
Delay in  Entering triage secondary to department transfer and assisting with procedure

## 2013-06-08 NOTE — ED Provider Notes (Signed)
Chief Complaint:   Chief Complaint  Patient presents with  . Wound Check    History of Present Illness:    [name] is a 27 year old female who returns today for packing removal from an abscess in her right groin. This was incised and drained 4 days ago. Cultures growing out staph. She's been on Septra. She states it still hurts. She denies any fever or chills.  Review of Systems:  Other than noted above, the patient denies any of the following symptoms: Systemic:  No fever, chills or sweats. Skin:  No rash or itching.  PMFSH:  Past medical history, family history, social history, meds, and allergies were reviewed.  No history of diabetes or prior history of abscesses or MRSA.   Physical Exam:   Vital signs:  BP 124/60  Pulse 83  Temp(Src) 98 F (36.7 C) (Oral)  Resp 16  SpO2 100%  LMP 05/13/2013 Skin:  The dressing was removed. She has some packing in place. The area the abscess is still little indurated and tender to palpation. The packing was removed and the wound cavity appears clear. Attempted to irrigate this with a little saline but because of discomfort was unable to do so.  Skin exam was otherwise normal.  No rash. Ext:  Distal pulses were full, patient has full ROM of all joints.  Procedure:  Verbal informed consent was obtained.  The patient was informed of the risks and benefits of the procedure and understands and accepts.  Identity of the patient was verified verbally and by wristband. The packing was removed, antibiotic ointment was applied and a sterile dressing.  Assessment:  The encounter diagnosis was Abscess.  Symptomatically she's not much better, but this looks like it's healing well. She was urged to finish up the antibiotics.  Plan:   1.  The following meds were prescribed:   Discharge Medication List as of 06/08/2013  7:40 PM    START taking these medications   Details  !! oxyCODONE-acetaminophen (PERCOCET) 5-325 MG per tablet 1 to 2 tablets every  6 hours as needed for pain., Print     !! - Potential duplicate medications found. Please discuss with provider.     2.  The patient was instructed in symptomatic care and handouts were given. 3.  The patient was instructed to leave the dressing in place and return again as needed.  Given red flag symptoms such as fever or increasing pain that would indicate earlier return.   Reuben Likes, MD 06/08/13 2025

## 2013-06-09 LAB — CULTURE, ROUTINE-ABSCESS

## 2013-06-10 NOTE — ED Notes (Signed)
Abscess culture R groin: Few Staph. Aureus. Pt. adequately treated with Bactrim DS. Vassie Moselle 06/10/2013

## 2013-06-29 ENCOUNTER — Encounter (HOSPITAL_COMMUNITY): Payer: Self-pay | Admitting: Emergency Medicine

## 2013-06-29 ENCOUNTER — Emergency Department (HOSPITAL_COMMUNITY)
Admission: EM | Admit: 2013-06-29 | Discharge: 2013-06-29 | Disposition: A | Discharge status: Home / Self Care | Payer: Medicaid Other | Attending: Emergency Medicine | Admitting: Emergency Medicine

## 2013-06-29 DIAGNOSIS — E669 Obesity, unspecified: Secondary | ICD-10-CM | POA: Insufficient documentation

## 2013-06-29 DIAGNOSIS — F17200 Nicotine dependence, unspecified, uncomplicated: Secondary | ICD-10-CM | POA: Insufficient documentation

## 2013-06-29 DIAGNOSIS — Z8614 Personal history of Methicillin resistant Staphylococcus aureus infection: Secondary | ICD-10-CM | POA: Insufficient documentation

## 2013-06-29 DIAGNOSIS — N764 Abscess of vulva: Secondary | ICD-10-CM | POA: Insufficient documentation

## 2013-06-29 MED ORDER — SULFAMETHOXAZOLE-TRIMETHOPRIM 800-160 MG PO TABS: 1.0000 | ORAL_TABLET | Freq: Two times a day (BID) | ORAL | Status: DC

## 2013-06-29 MED ORDER — HYDROCODONE-ACETAMINOPHEN 5-325 MG PO TABS: 2.0000 | ORAL_TABLET | ORAL | Status: DC | PRN

## 2013-06-29 MED ORDER — IBUPROFEN 800 MG PO TABS: 800.0000 mg | ORAL_TABLET | Freq: Three times a day (TID) | ORAL | Status: DC

## 2013-06-29 NOTE — ED Provider Notes (Signed)
History    CSN: [id number] Arrival date & time 06/29/13  0138  First MD Initiated Contact with Patient 06/29/13 0201     Chief Complaint  Patient presents with  . Abscess   (Consider location/radiation/quality/duration/timing/severity/associated sxs/prior Treatment) HPI Hx per PT - h/o MRSA.  Had R leg abscess 3 weeks ago.  That is resolved. Now for the last 2 days L groin. No F/C. Area is painful.  Has not been draining. No N/V/D, no diff urinating. Symptoms MOD in severity.   Past Medical History  Diagnosis Date  . Obesity    History reviewed. No pertinent past surgical history. No family history on file. History  Substance Use Topics  . Smoking status: Current Every Day Smoker -- 1.00 packs/day    Types: Cigarettes  . Smokeless tobacco: Not on file  . Alcohol Use: No   OB History   Grav Para Term Preterm Abortions TAB SAB Ect Mult Living                 Review of Systems  Constitutional: Negative for fever and chills.  HENT: Negative for neck pain and neck stiffness.   Eyes: Negative for pain.  Respiratory: Negative for shortness of breath.   Cardiovascular: Negative for chest pain.  Gastrointestinal: Negative for abdominal pain.  Genitourinary: Negative for dysuria.  Musculoskeletal: Negative for back pain.  Skin: Positive for wound. Negative for rash.  Neurological: Negative for headaches.  All other systems reviewed and are negative.    Allergies  Review of patient's allergies indicates no known allergies.  Home Medications  No current outpatient prescriptions on file. BP 134/109  Temp(Src) 98.1 F (36.7 C) (Oral)  Resp 18  SpO2 100%  LMP 06/12/2013 Physical Exam  Constitutional: She is oriented to person, place, and time. She appears well-developed and well-nourished.  HENT:  Head: Normocephalic and atraumatic.  Eyes: EOM are normal. Pupils are equal, round, and reactive to light.  Neck: Neck supple.  Cardiovascular: Regular rhythm and intact  distal pulses.   Pulmonary/Chest: Effort normal. No respiratory distress.  Genitourinary:  Left labial abscess; moderate sized area of tenderness with fluctuance and pointing. No active drainage.  Musculoskeletal: Normal range of motion. She exhibits no edema.  Neurological: She is alert and oriented to person, place, and time.  Skin: Skin is warm and dry.    ED Course  INCISION AND DRAINAGE Date/Time: 06/29/2013 3:31 AM Performed by: Sunnie Nielsen Authorized by: Sunnie Nielsen Consent: Verbal consent obtained. Risks and benefits: risks, benefits and alternatives were discussed Consent given by: patient Patient understanding: patient states understanding of the procedure being performed Patient consent: the patient's understanding of the procedure matches consent given Procedure consent: procedure consent matches procedure scheduled Required items: required blood products, implants, devices, and special equipment available Patient identity confirmed: verbally with patient Time out: Immediately prior to procedure a "time out" was called to verify the correct patient, procedure, equipment, support staff and site/side marked as required. Type: abscess Location: Left labial. Anesthesia: local infiltration Local anesthetic: lidocaine 1% with epinephrine Anesthetic total: 2 ml Risk factor: underlying major vessel Scalpel size: 11 Needle gauge: 22 Incision type: elliptical Complexity: complex Drainage: purulent Drainage amount: moderate Wound treatment: wound left open Patient tolerance: Patient tolerated the procedure well with no immediate complications. Comments: Chaperone present   (including critical care time)  Wound care provided after I&D with bandage placed. Plan discharge home with antibiotics and pain medications. Abscess instructions and precautions verbalized as understood. Plan recheck  48 hours in the emergency department or with OB/GYN.  MDM  Left labial abscess with  I&D   Vital signs and nurses notes reviewed and considered  Sunnie Nielsen, MD 06/29/13 [phone]

## 2013-06-29 NOTE — ED Notes (Signed)
PT. REPORTS ABSCESS AT LEFT GROIN ONSET 2 DAYS AGO , DENIES DRAINAGE . NO FEVER .

## 2013-07-30 ENCOUNTER — Encounter (INDEPENDENT_AMBULATORY_CARE_PROVIDER_SITE_OTHER): Payer: Self-pay | Admitting: General Surgery

## 2013-07-30 ENCOUNTER — Emergency Department (INDEPENDENT_AMBULATORY_CARE_PROVIDER_SITE_OTHER)
Admission: EM | Admit: 2013-07-30 | Discharge: 2013-07-30 | Disposition: A | Discharge status: Home / Self Care | Payer: Medicaid Other | Source: Home / Self Care | Attending: Family Medicine | Admitting: Family Medicine

## 2013-07-30 ENCOUNTER — Encounter (HOSPITAL_COMMUNITY): Payer: Self-pay | Admitting: Emergency Medicine

## 2013-07-30 ENCOUNTER — Encounter (HOSPITAL_COMMUNITY): Payer: Self-pay | Admitting: General Practice

## 2013-07-30 ENCOUNTER — Ambulatory Visit (INDEPENDENT_AMBULATORY_CARE_PROVIDER_SITE_OTHER): Payer: Medicaid Other | Admitting: General Surgery

## 2013-07-30 ENCOUNTER — Inpatient Hospital Stay (HOSPITAL_COMMUNITY)
Admission: AD | Admit: 2013-07-30 | Discharge: 2013-08-01 | DRG: 601 | Disposition: A | Discharge status: Home / Self Care | Payer: Medicaid Other | Source: Ambulatory Visit | Attending: Surgery | Admitting: Surgery

## 2013-07-30 VITALS — BP 116/64 | HR 76 | Temp 98.9°F | Resp 15 | Ht 62.0 in | Wt 204.2 lb

## 2013-07-30 DIAGNOSIS — N61 Mastitis without abscess: Secondary | ICD-10-CM

## 2013-07-30 DIAGNOSIS — F17200 Nicotine dependence, unspecified, uncomplicated: Secondary | ICD-10-CM | POA: Diagnosis present

## 2013-07-30 DIAGNOSIS — N611 Abscess of the breast and nipple: Secondary | ICD-10-CM

## 2013-07-30 LAB — CBC
Hemoglobin: 13.7 g/dL (ref 12.0–15.0)
MCH: 31.7 pg (ref 26.0–34.0)
MCV: 92.8 fL (ref 78.0–100.0)
RBC: 4.32 MIL/uL (ref 3.87–5.11)

## 2013-07-30 LAB — BASIC METABOLIC PANEL
CO2: 23 mEq/L (ref 19–32)
Glucose, Bld: 80 mg/dL (ref 70–99)
Potassium: 3.7 mEq/L (ref 3.5–5.1)
Sodium: 136 mEq/L (ref 135–145)

## 2013-07-30 MED ORDER — DOXYCYCLINE HYCLATE 100 MG PO CAPS: 100.0000 mg | ORAL_CAPSULE | Freq: Two times a day (BID) | ORAL | Status: DC

## 2013-07-30 MED ORDER — CEFTRIAXONE SODIUM 1 G IJ SOLR
INTRAMUSCULAR | Status: AC
  Filled 2013-07-30: qty 10

## 2013-07-30 MED ORDER — LIDOCAINE HCL (PF) 1 % IJ SOLN
INTRAMUSCULAR | Status: AC
  Filled 2013-07-30: qty 5

## 2013-07-30 MED ORDER — HYDROCODONE-ACETAMINOPHEN 5-325 MG PO TABS
ORAL_TABLET | ORAL | Status: AC
  Filled 2013-07-30: qty 1

## 2013-07-30 MED ORDER — ACETAMINOPHEN 325 MG PO TABS
650.0000 mg | ORAL_TABLET | Freq: Four times a day (QID) | ORAL | Status: DC | PRN
  Administered 2013-07-30: 650 mg via ORAL
  Filled 2013-07-30: qty 2

## 2013-07-30 MED ORDER — IBUPROFEN 800 MG PO TABS
ORAL_TABLET | ORAL | Status: AC
  Filled 2013-07-30: qty 1

## 2013-07-30 MED ORDER — SODIUM CHLORIDE 0.9 % IV SOLN
3.0000 g | Freq: Four times a day (QID) | INTRAVENOUS | Status: DC
  Administered 2013-07-30 – 2013-08-01 (×6): 3 g via INTRAVENOUS
  Filled 2013-07-30 (×10): qty 3

## 2013-07-30 MED ORDER — CHLORHEXIDINE GLUCONATE 4 % EX LIQD: 60.0000 mL | Freq: Every day | CUTANEOUS | Status: DC | PRN

## 2013-07-30 MED ORDER — ONDANSETRON HCL 4 MG/2ML IJ SOLN: 4.0000 mg | Freq: Four times a day (QID) | INTRAMUSCULAR | Status: DC | PRN

## 2013-07-30 MED ORDER — SODIUM CHLORIDE 0.9 % IV SOLN
INTRAVENOUS | Status: DC
  Administered 2013-07-30: 23:00:00 via INTRAVENOUS
  Administered 2013-07-31: 1000 mL via INTRAVENOUS

## 2013-07-30 MED ORDER — HYDROCODONE-ACETAMINOPHEN 5-325 MG PO TABS: 2.0000 | ORAL_TABLET | Freq: Three times a day (TID) | ORAL | Status: DC | PRN

## 2013-07-30 MED ORDER — CEFTRIAXONE SODIUM 1 G IJ SOLR
1.0000 g | Freq: Once | INTRAMUSCULAR | Status: AC
  Administered 2013-07-30: 1 g via INTRAMUSCULAR

## 2013-07-30 MED ORDER — HYDROCODONE-ACETAMINOPHEN 5-325 MG PO TABS
1.0000 | ORAL_TABLET | Freq: Once | ORAL | Status: AC
  Administered 2013-07-30: 1 via ORAL

## 2013-07-30 MED ORDER — ACETAMINOPHEN 650 MG RE SUPP: 650.0000 mg | Freq: Four times a day (QID) | RECTAL | Status: DC | PRN

## 2013-07-30 MED ORDER — IBUPROFEN 800 MG PO TABS
800.0000 mg | ORAL_TABLET | Freq: Once | ORAL | Status: AC
  Administered 2013-07-30: 800 mg via ORAL

## 2013-07-30 MED ORDER — MORPHINE SULFATE 2 MG/ML IJ SOLN
2.0000 mg | INTRAMUSCULAR | Status: DC | PRN
  Administered 2013-07-30 – 2013-08-01 (×7): 2 mg via INTRAVENOUS
  Filled 2013-07-30 (×8): qty 1

## 2013-07-30 MED ORDER — IBUPROFEN 600 MG PO TABS: 600.0000 mg | ORAL_TABLET | Freq: Three times a day (TID) | ORAL | Status: DC

## 2013-07-30 NOTE — Progress Notes (Signed)
Patient ID: [name], female   DOB: 1986-06-28, 28 y.o.   MRN: [id number]  Chief Complaint  Patient presents with  . New Evaluation    absc lft br    HPI [name] is a 27 y.o. female.  Referred by Dr Ladon Applebaum HPI This is a 27 year old female who presents with a three-day history of a tender left breast mass. This is becoming become increasingly tender and painful over that time period she has no drainage or any nipple discharge. She sought medical attention with urgent care today and was referred to our office. She has a prior episode in 2013 of the left breast that breast abscess washing was pregnant it was treated conservatively but eventually sounds like it just actually ruptured through the site where was attempted to be drained. She's not had any problems since then. She has a family sure breast cancer and some cousins. She's had otherwise no problems with either breast in the past. She denies fevers.  Past Medical History  Diagnosis Date  . Obesity     History reviewed. No pertinent past surgical history.  History reviewed. No pertinent family history.  Social History History  Substance Use Topics  . Smoking status: Current Every Day Smoker -- 1.00 packs/day    Types: Cigarettes  . Smokeless tobacco: Not on file  . Alcohol Use: No    No Known Allergies  Current Outpatient Prescriptions  Medication Sig Dispense Refill  . chlorhexidine (HIBICLENS) 4 % external liquid Apply 60 mL (4 application total) topically daily as needed.  120 mL  0  . ibuprofen (ADVIL,MOTRIN) 600 MG tablet Take 1 tablet (600 mg total) by mouth 3 (three) times daily. Take with food  30 tablet  0  . doxycycline (VIBRAMYCIN) 100 MG capsule Take 1 capsule (100 mg total) by mouth 2 (two) times daily.  20 capsule  0  . HYDROcodone-acetaminophen (NORCO/VICODIN) 5-325 MG per tablet Take 2 tablets by mouth every 8 (eight) hours as needed for pain.  10 tablet  0   No current facility-administered  medications for this visit.    Review of Systems Review of Systems  Constitutional: Negative for fever, chills and unexpected weight change.  HENT: Negative for hearing loss, congestion, sore throat, trouble swallowing and voice change.   Eyes: Negative for visual disturbance.  Respiratory: Negative for cough and wheezing.   Cardiovascular: Negative for chest pain, palpitations and leg swelling.  Gastrointestinal: Negative for nausea, vomiting, abdominal pain, diarrhea, constipation, blood in stool, abdominal distention and anal bleeding.  Genitourinary: Negative for hematuria, vaginal bleeding and difficulty urinating.  Musculoskeletal: Negative for arthralgias.  Skin: Negative for rash and wound.  Neurological: Negative for seizures, syncope and headaches.  Hematological: Negative for adenopathy. Does not bruise/bleed easily.  Psychiatric/Behavioral: Negative for confusion.    Blood pressure 116/64, pulse 76, temperature 98.9 F (37.2 C), temperature source Temporal, resp. rate 15, height 5\' 2"  (1.575 m), weight 204 lb 3.2 oz (92.625 kg), last menstrual period 07/05/2013.  Physical Exam Physical Exam  Vitals reviewed. Constitutional: She appears well-developed and well-nourished.  Cardiovascular: Normal rate, regular rhythm and normal heart sounds.   Pulmonary/Chest: Effort normal and breath sounds normal. She has no wheezes. She has no rales. Left breast exhibits mass (5 cm subareolar abscess with surrounding erythema, tender).  Lymphadenopathy:    She has no cervical adenopathy.    She has no axillary adenopathy.       Right: No supraclavicular adenopathy present.  Left: No supraclavicular adenopathy present.    Data Reviewed Urgent care notes reviewed  Assessment    Left breast abscess recurrent     Plan    This is a recurrent left breast abscess. I don't think a conservative therapies and he get this to heal at this point although that would be ideal. Be very  difficult to drain in the office as she does not want to do that either. I think she needs to be admitted and have this done under anesthesia. I spoke to my partner at the hospital and we'll plan on putting in the hospital tonight, place her on IV antibiotics, and draining this in the morning.        Joban Colledge 07/30/2013, 4:54 PM

## 2013-07-30 NOTE — ED Provider Notes (Signed)
CSN: [id number]     Arrival date & time 07/30/13  1038 History     First MD Initiated Contact with Patient 07/30/13 1114     Chief Complaint  Patient presents with  . Abscess   (Consider location/radiation/quality/duration/timing/severity/associated sxs/prior Treatment) HPI Comments: 27 year old female with history of obesity, nondiabetic. Also with history of recurrent skin abscess. Here complaining of left breast redness and tenderness for 3 days. Denies fever or chills. Taking ibuprofen with minimal improvement. Denies self drainage. No personal h/o breast cancer. No mother or sisters h/o breast cancer.    Past Medical History  Diagnosis Date  . Obesity    History reviewed. No pertinent past surgical history. No family history on file. History  Substance Use Topics  . Smoking status: Current Every Day Smoker -- 1.00 packs/day    Types: Cigarettes  . Smokeless tobacco: Not on file  . Alcohol Use: No   OB History   Grav Para Term Preterm Abortions TAB SAB Ect Mult Living                 Review of Systems  Constitutional: Negative for fever, chills and appetite change.  Gastrointestinal: Negative for nausea.  Skin: Positive for rash. Negative for wound.       Left breast redness and pain as per HPI.  Neurological: Negative for headaches.  All other systems reviewed and are negative.    Allergies  Review of patient's allergies indicates no known allergies.  Home Medications   Current Outpatient Rx  Name  Route  Sig  Dispense  Refill  . chlorhexidine (HIBICLENS) 4 % external liquid   Topical   Apply 60 mL (4 application total) topically daily as needed.   120 mL   0   . doxycycline (VIBRAMYCIN) 100 MG capsule   Oral   Take 1 capsule (100 mg total) by mouth 2 (two) times daily.   20 capsule   0   . HYDROcodone-acetaminophen (NORCO/VICODIN) 5-325 MG per tablet   Oral   Take 2 tablets by mouth every 8 (eight) hours as needed for pain.   10 tablet   0   .  ibuprofen (ADVIL,MOTRIN) 600 MG tablet   Oral   Take 1 tablet (600 mg total) by mouth 3 (three) times daily. Take with food   30 tablet   0    BP 133/90  Pulse 80  Temp(Src) 98.3 F (36.8 C) (Oral)  Resp 16  SpO2 98%  LMP 07/05/2013 Physical Exam  Nursing note and vitals reviewed. Constitutional: She is oriented to person, place, and time. She appears well-developed and well-nourished. No distress.  HENT:  Head: Normocephalic and atraumatic.  Cardiovascular: Normal heart sounds.   Pulmonary/Chest: Breath sounds normal.  Abdominal: Soft. There is no tenderness.  Neurological: She is alert and oriented to person, place, and time.  Skin: She is not diaphoretic.  Left breast: Large breast looks symmetric compared with right. There is very tender erythema involving areola and extending concentrically about 3cm outside areola. Increased temperature. Nipple appears retracted. There is a subareolar induration of about 5cm. No nipple discharge. There is a dry superficial pustule with skin peeling in the top located at the areola at 9 O'clock. No spontaneous drainage.  No focal fluctuations.  No axillary tender lymphadenopaties.     ED Course   Procedures (including critical care time)  Labs Reviewed - No data to display No results found. 1. Cellulitis of female breast     MDM  Impress cellulitis of the left breast involving Areola and extending concentrically few centimeters beyond. I do not feel a focal fluctuation, there is concerning nipple retraction and possible abscess formation below induration. Will like surgery consult. Patient was treated here with Rocephin and 1 g IM x1 and hydrocodone 325/5 mg oral x1 and ibuprofen 800 mg oral x1. Prescribed doxycycline, ibuprofen and hydrocodone/acetaminophen 325/5 mg #10 tablets. Also recommended Hibiclens body wash. Scheduled [facility] at [facility] this afternoon at 4pm. Patient aware and agreeable. If for any reason  she does not follow up with surgeon she was instructed to return here in 24-48 hours for follow.   Sharin Grave, MD 07/30/13 1229

## 2013-07-30 NOTE — ED Notes (Signed)
Cousin and friend came to p/u pt.

## 2013-07-30 NOTE — ED Notes (Signed)
Pt c/o abscess on side of left breast onset 3 days... Seen on 6/23 for similar sxs Getting bigger and tender to the touch Denies fevers, drainage... Taking ibup w/no relief Alert w/no signs of acute distress.

## 2013-07-30 NOTE — ED Notes (Signed)
Pt called her cousin to p/u

## 2013-07-31 ENCOUNTER — Encounter (HOSPITAL_COMMUNITY): Admission: AD | Disposition: A | Discharge status: Home / Self Care | Payer: Self-pay | Source: Ambulatory Visit

## 2013-07-31 ENCOUNTER — Observation Stay (HOSPITAL_COMMUNITY): Payer: Medicaid Other | Admitting: Anesthesiology

## 2013-07-31 ENCOUNTER — Encounter (HOSPITAL_COMMUNITY): Payer: Self-pay

## 2013-07-31 ENCOUNTER — Encounter (HOSPITAL_COMMUNITY): Payer: Self-pay | Admitting: Anesthesiology

## 2013-07-31 ENCOUNTER — Inpatient Hospital Stay: Admit: 2013-07-31 | Payer: Self-pay | Admitting: General Surgery

## 2013-07-31 HISTORY — PX: BREAST BIOPSY: SHX20

## 2013-07-31 SURGERY — BREAST BIOPSY
Anesthesia: General | Site: Breast | Laterality: Left | Wound class: Dirty or Infected

## 2013-07-31 MED ORDER — OXYCODONE HCL 5 MG PO TABS: 5.0000 mg | ORAL_TABLET | Freq: Once | ORAL | Status: DC | PRN

## 2013-07-31 MED ORDER — HYDROMORPHONE HCL PF 1 MG/ML IJ SOLN
0.2500 mg | INTRAMUSCULAR | Status: DC | PRN
Start: 2013-07-31 — End: 2013-07-31
  Administered 2013-07-31 (×4): 0.5 mg via INTRAVENOUS

## 2013-07-31 MED ORDER — HYDROCODONE-ACETAMINOPHEN 5-325 MG PO TABS
1.0000 | ORAL_TABLET | ORAL | Status: DC | PRN
  Administered 2013-07-31 – 2013-08-01 (×3): 1 via ORAL
  Filled 2013-07-31 (×3): qty 1

## 2013-07-31 MED ORDER — FENTANYL CITRATE 0.05 MG/ML IJ SOLN
INTRAMUSCULAR | Status: DC | PRN
  Administered 2013-07-31 (×2): 50 ug via INTRAVENOUS

## 2013-07-31 MED ORDER — BUPIVACAINE-EPINEPHRINE 0.25% -1:200000 IJ SOLN
INTRAMUSCULAR | Status: DC | PRN
  Administered 2013-07-31: 30 mL

## 2013-07-31 MED ORDER — PROMETHAZINE HCL 25 MG/ML IJ SOLN: 6.2500 mg | INTRAMUSCULAR | Status: DC | PRN

## 2013-07-31 MED ORDER — MIDAZOLAM HCL 5 MG/5ML IJ SOLN
INTRAMUSCULAR | Status: DC | PRN
  Administered 2013-07-31: 2 mg via INTRAVENOUS

## 2013-07-31 MED ORDER — DEXAMETHASONE SODIUM PHOSPHATE 10 MG/ML IJ SOLN
INTRAMUSCULAR | Status: DC | PRN
  Administered 2013-07-31: 10 mg via INTRAVENOUS

## 2013-07-31 MED ORDER — SODIUM CHLORIDE 0.9 % IV SOLN
INTRAVENOUS | Status: AC
  Filled 2013-07-31: qty 3

## 2013-07-31 MED ORDER — HYDROMORPHONE HCL PF 1 MG/ML IJ SOLN
INTRAMUSCULAR | Status: AC
  Filled 2013-07-31: qty 1

## 2013-07-31 MED ORDER — OXYCODONE HCL 5 MG/5ML PO SOLN
5.0000 mg | Freq: Once | ORAL | Status: DC | PRN
  Filled 2013-07-31: qty 5

## 2013-07-31 MED ORDER — PROPOFOL 10 MG/ML IV BOLUS
INTRAVENOUS | Status: DC | PRN
  Administered 2013-07-31: 150 mg via INTRAVENOUS

## 2013-07-31 MED ORDER — ONDANSETRON HCL 4 MG/2ML IJ SOLN
INTRAMUSCULAR | Status: DC | PRN
  Administered 2013-07-31: 4 mg via INTRAVENOUS

## 2013-07-31 MED ORDER — MEPERIDINE HCL 50 MG/ML IJ SOLN: 6.2500 mg | INTRAMUSCULAR | Status: DC | PRN

## 2013-07-31 SURGICAL SUPPLY — 40 items
BLADE HEX COATED 2.75 (ELECTRODE) IMPLANT
BLADE SURG 15 STRL LF DISP TIS (BLADE) ×1 IMPLANT
BLADE SURG 15 STRL SS (BLADE) ×1
CANISTER SUCTION 2500CC (MISCELLANEOUS) IMPLANT
CHLORAPREP W/TINT 26ML (MISCELLANEOUS) ×2 IMPLANT
CLOTH BEACON ORANGE TIMEOUT ST (SAFETY) ×2 IMPLANT
COVER SURGICAL LIGHT HANDLE (MISCELLANEOUS) ×2 IMPLANT
DECANTER SPIKE VIAL GLASS SM (MISCELLANEOUS) IMPLANT
DERMABOND ADVANCED (GAUZE/BANDAGES/DRESSINGS)
DERMABOND ADVANCED .7 DNX12 (GAUZE/BANDAGES/DRESSINGS) IMPLANT
DRAPE LAPAROSCOPIC ABDOMINAL (DRAPES) IMPLANT
DRAPE PED LAPAROTOMY (DRAPES) ×2 IMPLANT
ELECT REM PT RETURN 9FT ADLT (ELECTROSURGICAL) ×2
ELECTRODE REM PT RTRN 9FT ADLT (ELECTROSURGICAL) ×1 IMPLANT
GAUZE PACKING 1 X5 YD ST (GAUZE/BANDAGES/DRESSINGS) ×2 IMPLANT
GAUZE SPONGE 4X4 16PLY XRAY LF (GAUZE/BANDAGES/DRESSINGS) IMPLANT
GLOVE BIO SURGEON STRL SZ7 (GLOVE) ×2 IMPLANT
GLOVE BIOGEL PI IND STRL 7.0 (GLOVE) IMPLANT
GLOVE BIOGEL PI INDICATOR 7.0 (GLOVE)
GLOVE SURG SS PI 7.5 STRL IVOR (GLOVE) ×4 IMPLANT
GOWN PREVENTION PLUS LG XLONG (DISPOSABLE) ×4 IMPLANT
GOWN STRL NON-REIN LRG LVL3 (GOWN DISPOSABLE) IMPLANT
GOWN STRL REIN XL XLG (GOWN DISPOSABLE) ×4 IMPLANT
KIT BASIN OR (CUSTOM PROCEDURE TRAY) ×2 IMPLANT
MARKER SKIN DUAL TIP RULER LAB (MISCELLANEOUS) IMPLANT
NEEDLE HYPO 22GX1.5 SAFETY (NEEDLE) ×2 IMPLANT
NEEDLE HYPO 25X1 1.5 SAFETY (NEEDLE) ×2 IMPLANT
PACK BASIC VI WITH GOWN DISP (CUSTOM PROCEDURE TRAY) ×2 IMPLANT
PENCIL BUTTON HOLSTER BLD 10FT (ELECTRODE) IMPLANT
SPONGE GAUZE 4X4 12PLY (GAUZE/BANDAGES/DRESSINGS) IMPLANT
STRIP CLOSURE SKIN 1/2X4 (GAUZE/BANDAGES/DRESSINGS) IMPLANT
SUT MNCRL AB 4-0 PS2 18 (SUTURE) IMPLANT
SUT VIC AB 2-0 SH 27 (SUTURE)
SUT VIC AB 2-0 SH 27X BRD (SUTURE) IMPLANT
SUT VIC AB 3-0 SH 27 (SUTURE)
SUT VIC AB 3-0 SH 27XBRD (SUTURE) IMPLANT
SYR BULB IRRIGATION 50ML (SYRINGE) IMPLANT
SYR CONTROL 10ML LL (SYRINGE) ×2 IMPLANT
TOWEL OR 17X26 10 PK STRL BLUE (TOWEL DISPOSABLE) ×2 IMPLANT
YANKAUER SUCT BULB TIP 10FT TU (MISCELLANEOUS) IMPLANT

## 2013-07-31 NOTE — Progress Notes (Signed)
INITIAL NUTRITION ASSESSMENT  Pt meets criteria for severe MALNUTRITION in the context of social/environmenatl circumstances as evidenced by <50% estimated energy intake for at least the past month with 14.3% weight loss in the past 2 months per pt report.   DOCUMENTATION CODES Per approved criteria  -Severe  malnutrition in the context of social or environmental circumstances -Obesity Unspecified   INTERVENTION: - Healthy meal plan and snacks discussed. Encouraged pt to eat more than 1 meal/day at home. - Will continue to monitor   NUTRITION DIAGNOSIS: Unintended weight loss related to stress, inadequate oral intake as evidenced by pt report.    Goal: Pt to consume >90% of meals.   Monitor:  Weights, labs, intake  Reason for Assessment: Nutrition risk   27 y.o. female  Admitting Dx: Left breast abscess recurrent   ASSESSMENT: S/p incision and drainage of left breast abscess today. Met with pt who reports eating only 1 meal/day "for a long time" due to having a busy stressful life and work schedule. Reports losing 34 pounds unintentionally in the past 2 months from not eating well. Pt c/o headaches and irritability when she skips meals. Pt eating well on regular diet currently.   Height: Ht Readings from Last 1 Encounters:  07/30/13 5\' 2"  (1.575 m)    Weight: Wt Readings from Last 1 Encounters:  07/30/13 204 lb (92.534 kg)    Ideal Body Weight: 110 lb  % Ideal Body Weight: 185%  Wt Readings from Last 10 Encounters:  07/30/13 204 lb (92.534 kg)  07/30/13 204 lb (92.534 kg)  07/30/13 204 lb 3.2 oz (92.625 kg)  11/19/12 230 lb (104.327 kg)    Usual Body Weight: 238 lb per pt  % Usual Body Weight: 86%  BMI:  Body mass index is 37.3 kg/(m^2). Class II obesity  Estimated Nutritional Needs: Kcal: 1250-1500 Protein: 60-70g Fluid: 1.2-1.5L/day  Skin: Left breast incision  Diet Order: General  EDUCATION NEEDS: -Education needs addressed - discussed ways to  promote healthy balanced diet    Intake/Output Summary (Last 24 hours) at 07/31/13 1446 Last data filed at 07/31/13 1400  Gross per 24 hour  Intake 1651.25 ml  Output    350 ml  Net 1301.25 ml    Last BM: 8/13  Labs:   Recent Labs Lab 07/30/13 2140  NA 136  K 3.7  CL 103  CO2 23  BUN 7  CREATININE 0.94  CALCIUM 8.9  GLUCOSE 80    CBG (last 3)  No results found for this basename: GLUCAP,  in the last 72 hours  Scheduled Meds: . ampicillin-sulbactam (UNASYN) IV  3 g Intravenous Q6H  . HYDROmorphone      . HYDROmorphone        Continuous Infusions: . sodium chloride 75 mL/hr at 07/31/13 1400    Past Medical History  Diagnosis Date  . Obesity     History reviewed. No pertinent past surgical history.   Levon Hedger MS, RD, LDN [phone] Pager [phone] After Hours Pager

## 2013-07-31 NOTE — Progress Notes (Signed)
  Subjective: Feels about the same  Objective: Vital signs in last 24 hours: Temp:  [97.8 F (36.6 C)-98.9 F (37.2 C)] 97.9 F (36.6 C) (08/15 0542) Pulse Rate:  [67-80] 67 (08/15 0542) Resp:  [15-18] 17 (08/15 0542) BP: (104-133)/(64-90) 104/72 mmHg (08/15 0542) SpO2:  [98 %-100 %] 99 % (08/15 0542) Weight:  [204 lb (92.534 kg)-204 lb 3.2 oz (92.625 kg)] 204 lb (92.534 kg) (08/14 2300) Last BM Date: 07/29/13  Intake/Output from previous day: 08/14 0701 - 08/15 0700 In: 720 [I.V.:520; IV Piggyback:200] Out: 350 [Urine:350] Intake/Output this shift:    General appearance: alert, cooperative and no distress Breasts: left breast tender and erythematous, +fluctuance at 10oclock  Lab Results:   Recent Labs  07/30/13 2140  WBC 14.6*  HGB 13.7  HCT 40.1  PLT 321   BMET  Recent Labs  07/30/13 2140  NA 136  K 3.7  CL 103  CO2 23  GLUCOSE 80  BUN 7  CREATININE 0.94  CALCIUM 8.9   PT/INR No results found for this basename: LABPROT, INR,  in the last 72 hours ABG No results found for this basename: PHART, PCO2, PO2, HCO3,  in the last 72 hours  Studies/Results: No results found.  Anti-infectives: Anti-infectives   Start     Dose/Rate Route Frequency Ordered Stop   07/30/13 2200  Ampicillin-Sulbactam (UNASYN) 3 g in sodium chloride 0.9 % 100 mL IVPB     3 g 100 mL/hr over 60 Minutes Intravenous Every 6 hours 07/30/13 2058        Assessment/Plan: s/p * No surgery found * I have seen and evaluated her.  She does have a fluctuant left breast abscess which needs I/D.  I discussed with her the risks of the procedure including infection, bleeding, pain, scarring and poor cosmesis, recurrence and fistula and she expressed understanding and desires to proceed with I/D of left breast abscess.  I also explained that smoking is a huge risk factor for this and for recurrence   LOS: 1 day    Lodema Pilot DAVID 07/31/2013

## 2013-07-31 NOTE — Op Note (Signed)
NAMEASTARIA, NANEZ NO.:  [id number]  MEDICAL RECORD NO.:  [id number]  LOCATION:  1538                         FACILITY:  [facility]  PHYSICIAN:  Lodema Pilot, MD       DATE OF BIRTH:  1986/08/02  DATE OF PROCEDURE:  07/31/2013 DATE OF DISCHARGE:                              OPERATIVE REPORT   PROCEDURE:  Incision and drainage of left breast abscess.  PREOPERATIVE DIAGNOSIS:  Left breast abscess.  POSTOPERATIVE DIAGNOSIS:  Left breast abscess.  SURGEON:  Lodema Pilot, MD  ASSISTANT:  None.  ANESTHESIA:  General LMA anesthesia.  FLUID:  300 mL crystalloid.  ESTIMATED BLOOD LOSS:  Minimal.  DRAINS:  None.  SPECIMENS:  Wound cultures sent to microbiology.  COMPLICATIONS:  None apparent.  FINDINGS:  Left breast abscess in the retrograde areolar position and wound packing.  INDICATIONS FOR PROCEDURE:  Ms. Lenig is a 27 year old female with recurrent left breast abscess.  She is a smoker and has tender erythematous fluctuant area in the left breast concerning for left breast abscess.  OPERATIVE DETAILS:  Ms. Bhat was seen and evaluated in the surgical ward and risks and benefits of procedure were discussed in lay terms. Informed consent was obtained.  Surgical site was marked prior to anesthetic administration with chaperone present.  She was already on therapeutic antibiotics and taken to the operating room, placed on table in supine position and general LMA anesthesia was obtained.  Her left breast and chest were prepped and draped in standard surgical fashion and procedure time-out was performed with all operative team members to confirm proper patient, procedure.  I aspirated the area of concern with 18-gauge needle with return of purulent material to guide my incisions and at the 10 to 11 o'clock position at the edge of the left nipple areolar complex.  I made an circumareolar incision and had the immediate return of purulent material.  I  explored the cavity and enlarged the incision to accommodate for wound packing and adequate drainage.  I broke up any loculations and irrigated the cavity of all purulent material and then obtained hemostasis with Bovie electrocautery.  The care was taken to minimize cautery under the nipple-areolar complex for concerns of possible nipple loss.  The wound was then packed with 1 inch Nu Gauze and hemostasis was adequate.  A sterile dressing was applied.  All sponge, needle, and instrument counts were correct at end of the case.  The patient tolerated procedure well without apparent complications.          ______________________________ Lodema Pilot, MD     BL/MEDQ  D:  07/31/2013  T:  07/31/2013  Job:  [id number]

## 2013-07-31 NOTE — Brief Op Note (Signed)
07/30/2013 - 07/31/2013  9:34 AM  PATIENT:  [name]  27 y.o. female  PRE-OPERATIVE DIAGNOSIS:  left breast abscess  POST-OPERATIVE DIAGNOSIS:  left breast abscess  PROCEDURE:  Procedure(s) with comments: IRRIGATION AND DEBRIDMENT LEFT BREAST ABSCESS (Left) - IRRIGATION AND DEBRIDEMENT LEFT BREAST ABSCESS  SURGEON:  Surgeon(s) and Role:    * Lodema Pilot, DO - Primary  PHYSICIAN ASSISTANT:   ASSISTANTS: none   ANESTHESIA:   general  EBL:  Total I/O In: 450 [I.V.:450] Out: -   BLOOD ADMINISTERED:none  DRAINS: wound pack   LOCAL MEDICATIONS USED:  MARCAINE    and LIDOCAINE   SPECIMEN:  Source of Specimen:  cultures  DISPOSITION OF SPECIMEN:  micro  COUNTS:  YES  TOURNIQUET:  * No tourniquets in log *  DICTATION: .Other Dictation: Dictation Number [id number]  PLAN OF CARE: Admit for overnight observation  PATIENT DISPOSITION:  PACU - hemodynamically stable.   Delay start of Pharmacological VTE agent (>24hrs) due to surgical blood loss or risk of bleeding: no

## 2013-07-31 NOTE — Transfer of Care (Signed)
Immediate Anesthesia Transfer of Care Note  Patient: [name]  Procedure(s) Performed: Procedure(s) with comments: IRRIGATION AND DEBRIDMENT LEFT BREAST ABSCESS (Left) - IRRIGATION AND DEBRIDEMENT LEFT BREAST ABSCESS  Patient Location: PACU  Anesthesia Type:General  Level of Consciousness: awake, alert , oriented and patient cooperative  Airway & Oxygen Therapy: Patient Spontanous Breathing and Patient connected to face mask oxygen  Post-op Assessment: Report given to PACU RN and Post -op Vital signs reviewed and stable  Post vital signs: Reviewed and stable  Complications: No apparent anesthesia complications

## 2013-07-31 NOTE — Anesthesia Preprocedure Evaluation (Addendum)
Anesthesia Evaluation  Patient identified by MRN, date of birth, ID band Patient awake    Reviewed: Allergy & Precautions, H&P , NPO status , Patient's Chart, lab work & pertinent test results  Airway Mallampati: II TM Distance: >3 FB Neck ROM: Full    Dental  (+) Dental Advisory Given and Teeth Intact   Pulmonary Current Smoker,  breath sounds clear to auscultation        Cardiovascular negative cardio ROS  Rhythm:Regular Rate:Normal     Neuro/Psych negative neurological ROS  negative psych ROS   GI/Hepatic negative GI ROS, Neg liver ROS,   Endo/Other  negative endocrine ROS  Renal/GU negative Renal ROS     Musculoskeletal negative musculoskeletal ROS (+)   Abdominal (+) + obese,   Peds  Hematology negative hematology ROS (+)   Anesthesia Other Findings   Reproductive/Obstetrics negative OB ROS                         Anesthesia Physical Anesthesia Plan  ASA: II  Anesthesia Plan: General   Post-op Pain Management:    Induction: Intravenous  Airway Management Planned: LMA  Additional Equipment:   Intra-op Plan:   Post-operative Plan: Extubation in OR  Informed Consent: I have reviewed the patients History and Physical, chart, labs and discussed the procedure including the risks, benefits and alternatives for the proposed anesthesia with the patient or authorized representative who has indicated his/her understanding and acceptance.   Dental advisory given  Plan Discussed with: CRNA  Anesthesia Plan Comments:         Anesthesia Quick Evaluation

## 2013-07-31 NOTE — Care Management Note (Signed)
    Page 1 of 1   07/31/2013     1:41:29 PM   CARE MANAGEMENT NOTE 07/31/2013  Patient:  [name], [name]   Account Number:  [id number]  Date Initiated:  07/31/2013  Documentation initiated by:  Lorenda Ishihara  Subjective/Objective Assessment:   27 yo female admitted with breast abscess, to OR for I&D. PTA lived at home with family.     Action/Plan:   Home when stable   Anticipated DC Date:  08/03/2013   Anticipated DC Plan:  HOME/SELF CARE      DC Planning Services  CM consult      Choice offered to / List presented to:             Status of service:  Completed, signed off Medicare Important Message given?   (If response is "NO", the following Medicare IM given date fields will be blank) Date Medicare IM given:   Date Additional Medicare IM given:    Discharge Disposition:  HOME/SELF CARE  Per UR Regulation:  Reviewed for med. necessity/level of care/duration of stay  If discussed at Long Length of Stay Meetings, dates discussed:    Comments:

## 2013-07-31 NOTE — Anesthesia Postprocedure Evaluation (Signed)
Anesthesia Post Note  Patient: [name]  Procedure(s) Performed: Procedure(s) (LRB): IRRIGATION AND DEBRIDMENT LEFT BREAST ABSCESS (Left)  Anesthesia type: General  Patient location: PACU  Post pain: Pain level controlled  Post assessment: Post-op Vital signs reviewed  Last Vitals: BP 121/76  Pulse 56  Temp(Src) 36.7 C (Oral)  Resp 16  Ht 5\' 2"  (1.575 m)  Wt 204 lb (92.534 kg)  BMI 37.3 kg/m2  SpO2 97%  LMP 07/05/2013  Post vital signs: Reviewed  Level of consciousness: sedated  Complications: No apparent anesthesia complications

## 2013-07-31 NOTE — Preoperative (Signed)
Beta Blockers   Reason not to administer Beta Blockers:Not Applicable 

## 2013-08-01 MED ORDER — HYDROMORPHONE HCL PF 1 MG/ML IJ SOLN
1.0000 mg | Freq: Once | INTRAMUSCULAR | Status: AC
  Administered 2013-08-01: 1 mg via INTRAVENOUS

## 2013-08-01 MED ORDER — HYDROMORPHONE HCL PF 1 MG/ML IJ SOLN
INTRAMUSCULAR | Status: AC
  Filled 2013-08-01: qty 1

## 2013-08-01 MED ORDER — OXYCODONE-ACETAMINOPHEN 5-325 MG PO TABS
1.0000 | ORAL_TABLET | ORAL | Status: DC | PRN
  Administered 2013-08-01: 2 via ORAL
  Filled 2013-08-01: qty 1
  Filled 2013-08-01: qty 2

## 2013-08-01 MED ORDER — OXYCODONE-ACETAMINOPHEN 5-325 MG PO TABS: 1.0000 | ORAL_TABLET | ORAL | Status: DC | PRN

## 2013-08-01 NOTE — Progress Notes (Signed)
An hour after I had given pt 2 percocet she stated that her pain level was better but she was still at a level 7.  Informed MD and he ordered a one time order of one more percocet.  At that time I started to remove packing from pt's would on her breast and she started crying saying that is I did not give her more pain med I was not going to be able to change her dressing.  I notified MD and received order for one dose of Dilaudid IV.  I then was able to change dressing, but pt was still c/o being in extreme pain.  Notified Md on call, no new orders written.

## 2013-08-01 NOTE — Progress Notes (Signed)
08/01/2013 0945 NCM spoke to pt and offered choice for [facility]. Pt agreeable to [facility] for [facility]. [facility] contact info added to dc instructions. Spoke to [employer] rep and soc will be 08/02/2013 for [facility] RN to do dressing changes. Isidoro Donning RN CCM Case Mgmt phone [phone]

## 2013-08-01 NOTE — Discharge Summary (Signed)
Physician Discharge Summary [facility]  Patient ID: [name] MRN: [id number] DOB/AGE: 1986/03/13 27 y.o.  Admit date: 07/30/2013 Discharge date: 08/01/2013  Admission Diagnoses:  Left breast abscess  Discharge Diagnoses:  Active Problems:   * No active hospital problems. *   Discharged Condition: good  Hospital Course: patient admitted with left breast abscess.  To OR 07/31/2013 for incision and debridement with open packing.  Post op course uncomplicated.  Pain well controlled.  Dressing changes initiated.  Anticipate discharge home on POD#1 with HHN.  Consults: None  Significant Diagnostic Studies: none  Treatments: surgery: I&D of left breast abscess  Discharge Exam: Blood pressure 114/73, pulse 56, temperature 97.9 F (36.6 C), temperature source Oral, resp. rate 16, height 5\' 2"  (1.575 m), weight 204 lb (92.534 kg), last menstrual period 07/05/2013, SpO2 96.00%. HEENT - clear Neck - soft Chest - clear bilaterally Cor - RRR Left Breast - dressing with small brownish drainage, moderate tenderness  Disposition: Home with family  Discharge Orders   Future Orders Complete By Expires   Change dressing (specify)  As directed    Comments:     Dressing change: 2 times per day using NS moistened gauze packing wet to dry.   Diet - low sodium heart healthy  As directed    Discharge instructions  As directed    Comments:     [facility] -- DISCHARGE INSTRUCTIONS  REMINDER:   Carry a list of your medications and allergies with you at all times  Call your pharmacy at least 1 week in advance to refill prescriptions  Do not mix any prescribed pain medicine with alcohol  Do not drive any motor vehicles while taking pain medication  Take medications with food unless otherwise directed  Follow-up appointments (date to return to physician): Please call [phone] to confirm your follow up appointment with your surgeon.  Call  your Surgeon if you have:  Temperature greater than 101.0  Persistent nausea and vomiting  Severe uncontrolled pain  Redness, tenderness, or signs of infection (pain, swelling, redness, odor or    green/yellow discharge around the site)  Difficulty breathing, headache or visual disturbances  Hives  Persistent dizziness or light-headedness  Any other questions or concerns you may have after discharge  In an emergency, call 911 or go to an Emergency Department at a nearby hospital.   Diet: Begin with liquids, and if they are tolerated, resume your usual diet.  Avoid spicy, greasy or heavy foods.  If you have nausea or vomiting, go back to liquids.  If you cannot keep liquids down, call your doctor.  Avoid alcohol consumption while on prescription pain medications. Good nutrition promotes healing. Increase fiber and fluids.   ADDITIONAL INSTRUCTIONS: Left breast wound care with wet to dry dressing change twice daily.  Daily shower.  HHN requested to assist.  Velora Heckler, MD, [facility] Office: [phone]   Increase activity slowly  As directed        Medication List         chlorhexidine 4 % external liquid  Commonly known as:  HIBICLENS  Apply 60 mL (4 application total) topically daily as needed.     doxycycline 100 MG capsule  Commonly known as:  VIBRAMYCIN  Take 1 capsule (100 mg total) by mouth 2 (two) times daily.     HYDROcodone-acetaminophen 5-325 MG per tablet  Commonly known as:  NORCO/VICODIN  Take 2 tablets by mouth  every 8 (eight) hours as needed for pain.     ibuprofen 600 MG tablet  Commonly known as:  ADVIL,MOTRIN  Take 1 tablet (600 mg total) by mouth 3 (three) times daily. Take with food     oxyCODONE-acetaminophen 5-325 MG per tablet  Commonly known as:  ROXICET  Take 1 tablet by mouth every 4 (four) hours as needed for pain.         Velora Heckler, MD, [facility] Office:  [phone]   Signed: Velora Heckler 08/01/2013, 8:20 AM

## 2013-08-03 ENCOUNTER — Telehealth (INDEPENDENT_AMBULATORY_CARE_PROVIDER_SITE_OTHER): Payer: Self-pay

## 2013-08-03 ENCOUNTER — Encounter (HOSPITAL_COMMUNITY): Payer: Self-pay | Admitting: General Surgery

## 2013-08-03 LAB — CULTURE, ROUTINE-ABSCESS: Gram Stain: NONE SEEN

## 2013-08-03 MED FILL — Bupivacaine Inj 0.25% w/ Epinephrine 1:200000 (PF): INTRAMUSCULAR | Qty: 30 | Status: AC

## 2013-08-03 NOTE — Telephone Encounter (Signed)
Called patient to give post op appointment.  Patient was unavailable at the time.  Message to be given to patient to contact our office.  RE:  Please make patient aware that she has been scheduled for a post op appt on 08/12/13 @ 9:45 am w/Dr. Biagio Quint.

## 2013-08-05 LAB — ANAEROBIC CULTURE

## 2013-08-12 ENCOUNTER — Encounter (INDEPENDENT_AMBULATORY_CARE_PROVIDER_SITE_OTHER): Payer: Medicaid Other | Admitting: General Surgery

## 2013-08-26 ENCOUNTER — Encounter (INDEPENDENT_AMBULATORY_CARE_PROVIDER_SITE_OTHER): Payer: Self-pay | Admitting: General Surgery

## 2013-10-08 ENCOUNTER — Emergency Department (HOSPITAL_COMMUNITY)
Admission: EM | Admit: 2013-10-08 | Discharge: 2013-10-09 | Disposition: A | Discharge status: Home / Self Care | Payer: Medicaid Other | Attending: Emergency Medicine | Admitting: Emergency Medicine

## 2013-10-08 ENCOUNTER — Encounter (HOSPITAL_COMMUNITY): Payer: Self-pay | Admitting: Emergency Medicine

## 2013-10-08 DIAGNOSIS — E669 Obesity, unspecified: Secondary | ICD-10-CM | POA: Insufficient documentation

## 2013-10-08 DIAGNOSIS — Y939 Activity, unspecified: Secondary | ICD-10-CM | POA: Insufficient documentation

## 2013-10-08 DIAGNOSIS — Y929 Unspecified place or not applicable: Secondary | ICD-10-CM | POA: Insufficient documentation

## 2013-10-08 DIAGNOSIS — F17200 Nicotine dependence, unspecified, uncomplicated: Secondary | ICD-10-CM | POA: Insufficient documentation

## 2013-10-08 DIAGNOSIS — X58XXXA Exposure to other specified factors, initial encounter: Secondary | ICD-10-CM | POA: Insufficient documentation

## 2013-10-08 DIAGNOSIS — T148XXA Other injury of unspecified body region, initial encounter: Secondary | ICD-10-CM

## 2013-10-08 DIAGNOSIS — S239XXA Sprain of unspecified parts of thorax, initial encounter: Secondary | ICD-10-CM | POA: Insufficient documentation

## 2013-10-08 MED ORDER — KETOROLAC TROMETHAMINE 60 MG/2ML IM SOLN
60.0000 mg | Freq: Once | INTRAMUSCULAR | Status: DC
  Filled 2013-10-08: qty 2

## 2013-10-08 MED ORDER — HYDROCODONE-ACETAMINOPHEN 5-325 MG PO TABS
2.0000 | ORAL_TABLET | Freq: Once | ORAL | Status: AC
  Administered 2013-10-08: 2 via ORAL
  Filled 2013-10-08: qty 2

## 2013-10-08 NOTE — ED Notes (Signed)
Pt states intermitant back pain in her right upper back. Pt states that the sports bra actually helps the pain. Pt denies known injury, or cause for pain.

## 2013-10-09 ENCOUNTER — Emergency Department (HOSPITAL_COMMUNITY): Payer: Medicaid Other

## 2013-10-09 MED ORDER — IBUPROFEN 800 MG PO TABS: 800.0000 mg | ORAL_TABLET | Freq: Three times a day (TID) | ORAL | Status: DC

## 2013-10-09 MED ORDER — HYDROCODONE-ACETAMINOPHEN 5-325 MG PO TABS: 1.0000 | ORAL_TABLET | ORAL | Status: DC | PRN

## 2013-10-09 NOTE — ED Provider Notes (Signed)
Medical screening examination/treatment/procedure(s) were performed by non-physician practitioner and as supervising physician I was immediately available for consultation/collaboration.     Olivia Mackie, MD 10/09/13 [phone]

## 2013-10-09 NOTE — ED Notes (Signed)
Pt discharged.Vital signs stable and GCS 15.Discharge instruction given. 

## 2013-10-09 NOTE — ED Provider Notes (Signed)
CSN: [id number]     Arrival date & time 10/08/13  2313 History   First MD Initiated Contact with Patient 10/08/13 2329     Chief Complaint  Patient presents with  . Back Pain   (Consider location/radiation/quality/duration/timing/severity/associated sxs/prior Treatment) HPI History provided by pt.   Pt presents w/ L mid back pain since 10am today.  Started while getting into her car.  Radiates through to her chest and is aggravated by ROM and minimally by deep inspiration.  No associated fever, cough, SOB, LE edema.  Low risk for PE; has a mirena IUD.  Denies trauma.   Past Medical History  Diagnosis Date  . Obesity    Past Surgical History  Procedure Laterality Date  . Breast biopsy Left 07/31/2013    Procedure: IRRIGATION AND DEBRIDMENT LEFT BREAST ABSCESS;  Surgeon: Lodema Pilot, DO;  Location: [facility];  Service: General;  Laterality: Left;  IRRIGATION AND DEBRIDEMENT LEFT BREAST ABSCESS   History reviewed. No pertinent family history. History  Substance Use Topics  . Smoking status: Current Every Day Smoker -- 1.00 packs/day    Types: Cigarettes  . Smokeless tobacco: Not on file  . Alcohol Use: No   OB History   Grav Para Term Preterm Abortions TAB SAB Ect Mult Living                 Review of Systems  All other systems reviewed and are negative.    Allergies  Review of patient's allergies indicates no known allergies.  Home Medications  No current outpatient prescriptions on file. BP 158/101  Pulse 91  Temp(Src) 97.5 F (36.4 C) (Oral)  Resp 20  Wt 202 lb 4 oz (91.74 kg)  BMI 36.98 kg/m2  SpO2 94%  LMP 10/04/2013 Physical Exam  Nursing note and vitals reviewed. Constitutional: She is oriented to person, place, and time. She appears well-developed and well-nourished. No distress.  HENT:  Head: Normocephalic and atraumatic.  Eyes:  Normal appearance  Neck: Normal range of motion.  Cardiovascular: Normal rate, regular rhythm and intact distal pulses.    Pulmonary/Chest: Effort normal and breath sounds normal. No respiratory distress. She exhibits no tenderness.  No pleuritic pain reported  Abdominal: Soft. Bowel sounds are normal. She exhibits no distension. There is no tenderness. There is no guarding.  Musculoskeletal: Normal range of motion.  No spinal tenderness.  Mild tenderness L thoracic paraspinals.  No peripheral edema or calf tenderness  Neurological: She is alert and oriented to person, place, and time.  Skin: Skin is warm and dry. No rash noted.  Psychiatric: She has a normal mood and affect. Her behavior is normal.    ED Course  Procedures (including critical care time) Labs Review Labs Reviewed - No data to display Imaging Review Dg Chest 2 View  10/09/2013   CLINICAL DATA:  Back pain  EXAM: CHEST  2 VIEW  COMPARISON:  None.  FINDINGS: The heart size and mediastinal contours are within normal limits. Both lungs are clear. The visualized skeletal structures are unremarkable.  IMPRESSION: No active cardiopulmonary disease.   Electronically Signed   By: Marlan Palau M.D.   On: 10/09/2013 00:10    EKG Interpretation   None       MDM   1. Muscle strain    27yo F presents w/ non-traumatic L mid-back pain that radiates through to L chest.  Reproducible w/ movement as well as palpation at L thoracic paraspinal muscles on exam.  Low risk for and  no exam findings consistent w/ PE.  CXR pending to r/o pneumothorax and pna, but I suspect that pain is muscular.  IM toradol and 2 vicodin ordered for pain.  12:15 AM   CXR neg.  Results discussed w/ pt.  D/c'd home w/ 800mg  ibuprofen and 8 vicodin.   Recommended heating pad and avoidance of aggravating activities.  She will f/u with PCP in 1 week if no improvement.  Return precautions discussed. 12:52 AM   Otilio Miu, PA-C 10/09/13 [phone]

## 2014-10-10 ENCOUNTER — Encounter (HOSPITAL_COMMUNITY): Payer: Self-pay | Admitting: Emergency Medicine

## 2014-10-10 DIAGNOSIS — Z720 Tobacco use: Secondary | ICD-10-CM | POA: Insufficient documentation

## 2014-10-10 DIAGNOSIS — Z9889 Other specified postprocedural states: Secondary | ICD-10-CM | POA: Insufficient documentation

## 2014-10-10 DIAGNOSIS — N61 Inflammatory disorders of breast: Secondary | ICD-10-CM | POA: Insufficient documentation

## 2014-10-10 DIAGNOSIS — E669 Obesity, unspecified: Secondary | ICD-10-CM | POA: Insufficient documentation

## 2014-10-10 NOTE — ED Notes (Signed)
The pt is c/o an abscess to her  Lt breast around the nipple.  She has been trying to get it to drain by squeezing it

## 2014-10-11 ENCOUNTER — Emergency Department (HOSPITAL_COMMUNITY)
Admission: EM | Admit: 2014-10-11 | Discharge: 2014-10-11 | Disposition: A | Discharge status: Home / Self Care | Payer: Medicaid Other | Attending: Emergency Medicine | Admitting: Emergency Medicine

## 2014-10-11 DIAGNOSIS — L0390 Cellulitis, unspecified: Secondary | ICD-10-CM

## 2014-10-11 DIAGNOSIS — L0291 Cutaneous abscess, unspecified: Secondary | ICD-10-CM

## 2014-10-11 MED ORDER — LIDOCAINE-EPINEPHRINE 1 %-1:100000 IJ SOLN: 10.0000 mL | Freq: Once | INTRAMUSCULAR | Status: DC

## 2014-10-11 MED ORDER — CEPHALEXIN 500 MG PO CAPS: 500.0000 mg | ORAL_CAPSULE | Freq: Two times a day (BID) | ORAL | Status: DC

## 2014-10-11 MED ORDER — HYDROCODONE-ACETAMINOPHEN 5-325 MG PO TABS
2.0000 | ORAL_TABLET | ORAL | Status: AC
  Administered 2014-10-11: 2 via ORAL
  Filled 2014-10-11: qty 2

## 2014-10-11 MED ORDER — DOXYCYCLINE HYCLATE 100 MG PO CAPS: 100.0000 mg | ORAL_CAPSULE | Freq: Two times a day (BID) | ORAL | Status: DC

## 2014-10-11 MED ORDER — TRAMADOL HCL 50 MG PO TABS
50.0000 mg | ORAL_TABLET | Freq: Once | ORAL | Status: AC
  Administered 2014-10-11: 50 mg via ORAL
  Filled 2014-10-11: qty 1

## 2014-10-11 NOTE — Discharge Instructions (Signed)
Abscess Ms. Marca AnconaGilmore, you were seen today for an abscess. This was drained by needle. Take antibiotics as prescribed. It is very important for you to follow-up with surgery for a wound check. Call for an appointment within the next 3 days. If any symptoms worsen come back to the emergency department for repeat evaluation. Thank you. An abscess (boil or furuncle) is an infected area on or under the skin. This area is filled with yellowish-white fluid (pus) and other material (debris). HOME CARE   Only take medicines as told by your doctor.  If you were given antibiotic medicine, take it as directed. Finish the medicine even if you start to feel better.  If gauze is used, follow your doctor's directions for changing the gauze.  To avoid spreading the infection:  Keep your abscess covered with a bandage.  Wash your hands well.  Do not share personal care items, towels, or whirlpools with others.  Avoid skin contact with others.  Keep your skin and clothes clean around the abscess.  Keep all doctor visits as told. GET HELP RIGHT AWAY IF:   You have more pain, puffiness (swelling), or redness in the wound site.  You have more fluid or blood coming from the wound site.  You have muscle aches, chills, or you feel sick.  You have a fever. MAKE SURE YOU:   Understand these instructions.  Will watch your condition.  Will get help right away if you are not doing well or get worse. Document Released: 05/21/2008 Document Revised: 06/03/2012 Document Reviewed: 02/15/2012 [facility] Patient Information 2015 [place], [place]. This information is not intended to replace advice given to you by your health care provider. Make sure you discuss any questions you have with your health care provider.

## 2014-10-11 NOTE — ED Provider Notes (Signed)
CSN: [id number]     Arrival date & time 10/10/14  2305 History   First MD Initiated Contact with Patient 10/11/14 0211     Chief Complaint  Patient presents with  . Abscess     (Consider location/radiation/quality/duration/timing/severity/associated sxs/prior Treatment) HPI [name] is a 28 y.o. female with no skin past medical history coming in with abscess on her left breast. Patient states she has a history of this last year in December. This had to be I and D.  She states the abscess has been causing her pain for the past 3 days. She has tried to squeeze to drain it without any effect. She has not taken anything for pain during the interval. She denies any fevers or systemic symptoms.  She has no chest pain or shortness of breath, patient has no further complaints.  10 Systems reviewed and are negative for acute change except as noted in the HPI.     Past Medical History  Diagnosis Date  . Obesity    Past Surgical History  Procedure Laterality Date  . Breast biopsy Left 07/31/2013    Procedure: IRRIGATION AND DEBRIDMENT LEFT BREAST ABSCESS;  Surgeon: Lodema PilotBrian Layton, DO;  Location: [facility];  Service: General;  Laterality: Left;  IRRIGATION AND DEBRIDEMENT LEFT BREAST ABSCESS   No family history on file. History  Substance Use Topics  . Smoking status: Current Every Day Smoker -- 1.00 packs/day    Types: Cigarettes  . Smokeless tobacco: Not on file  . Alcohol Use: No   OB History   Grav Para Term Preterm Abortions TAB SAB Ect Mult Living                 Review of Systems    Allergies  Review of patient's allergies indicates no known allergies.  Home Medications   Prior to Admission medications   Not on File   BP 107/68  Pulse 63  Temp(Src) 98 F (36.7 C) (Oral)  Resp 18  Ht 5\' 3"  (1.6 m)  Wt 206 lb (93.441 kg)  BMI 36.50 kg/m2  SpO2 100%  LMP 09/26/2014 Physical Exam  Nursing note and vitals reviewed. Constitutional: She is oriented to person,  place, and time. She appears well-developed and well-nourished. No distress.  HENT:  Head: Normocephalic and atraumatic.  Nose: Nose normal.  Mouth/Throat: Oropharynx is clear and moist. No oropharyngeal exudate.  Eyes: Conjunctivae and EOM are normal. Pupils are equal, round, and reactive to light. No scleral icterus.  Neck: Normal range of motion. Neck supple. No JVD present. No tracheal deviation present. No thyromegaly present.  Cardiovascular: Normal rate, regular rhythm and normal heart sounds.  Exam reveals no gallop and no friction rub.   No murmur heard. Pulmonary/Chest: Effort normal and breath sounds normal. No respiratory distress. She has no wheezes. She exhibits no tenderness.  Left breast with 2 x 2 cm area of warmth and erythema. There is a 1 x 1 cm area of fluctuance. It is tender to palpation. It does cross over the area but does not go near the nipple.  Abdominal: Soft. Bowel sounds are normal. She exhibits no distension and no mass. There is no tenderness. There is no rebound and no guarding.  Musculoskeletal: Normal range of motion. She exhibits no edema and no tenderness.  Lymphadenopathy:    She has no cervical adenopathy.  Neurological: She is alert and oriented to person, place, and time. No cranial nerve deficit. She exhibits normal muscle tone.  Skin: Skin  is warm and dry. No rash noted. She is not diaphoretic. No erythema. No pallor.    ED Course  Procedures (including critical care time) Labs Review Labs Reviewed - No data to display  Imaging Review No results found.   EKG Interpretation None      MDM   Final diagnoses:  None    Patient plans emergency department for an abscess on her left breast. Bedside ultrasound reveals an abscess half a centimeter deep. Patient does not want an IND, will perform needle aspiration and place the patient on antibiotics. Patient will follow up with general surgery for wound check. Her vital signs remained within  normal limits and she is safe for discharge.  Apiration of blood/fluid Performed by: Brianna CrumbleNI,[name] Consent obtained. Required items: required blood products, implants, devices, and special equipment available Patient identity confirmed: verbally with patient Time out: Immediately prior to procedure a "time out" was called to verify the correct patient, procedure, equipment, support staff and site/side marked as required. Preparation: Patient was prepped and draped in the usual sterile fashion. Patient tolerance: Patient tolerated the procedure well with no immediate complications.  Local anesthesia: 1%lidocaine with epi Location of aspiration: L breast for abscess, 2cc of pus returned     Brianna CrumbleAdeleke Latoshia Monrroy, MD 10/11/14 [id number]

## 2015-03-18 ENCOUNTER — Emergency Department (HOSPITAL_COMMUNITY)
Admission: EM | Admit: 2015-03-18 | Discharge: 2015-03-18 | Disposition: A | Discharge status: Home / Self Care | Payer: Medicaid Other | Source: Home / Self Care | Attending: Emergency Medicine | Admitting: Emergency Medicine

## 2015-03-18 ENCOUNTER — Encounter (HOSPITAL_COMMUNITY): Payer: Self-pay | Admitting: Emergency Medicine

## 2015-03-18 DIAGNOSIS — M546 Pain in thoracic spine: Secondary | ICD-10-CM

## 2015-03-18 DIAGNOSIS — K047 Periapical abscess without sinus: Secondary | ICD-10-CM | POA: Diagnosis not present

## 2015-03-18 LAB — POCT URINALYSIS DIP (DEVICE)
Bilirubin Urine: NEGATIVE
GLUCOSE, UA: NEGATIVE mg/dL
Ketones, ur: NEGATIVE mg/dL
Nitrite: NEGATIVE
PH: 7 (ref 5.0–8.0)
Protein, ur: NEGATIVE mg/dL
SPECIFIC GRAVITY, URINE: 1.015 (ref 1.005–1.030)
UROBILINOGEN UA: 0.2 mg/dL (ref 0.0–1.0)

## 2015-03-18 LAB — POCT PREGNANCY, URINE: Preg Test, Ur: NEGATIVE

## 2015-03-18 MED ORDER — PENICILLIN V POTASSIUM 500 MG PO TABS: 500.0000 mg | ORAL_TABLET | Freq: Four times a day (QID) | ORAL | Status: AC

## 2015-03-18 MED ORDER — IBUPROFEN 800 MG PO TABS: 800.0000 mg | ORAL_TABLET | Freq: Three times a day (TID) | ORAL | Status: DC

## 2015-03-18 MED ORDER — HYDROCODONE-ACETAMINOPHEN 5-325 MG PO TABS: 1.0000 | ORAL_TABLET | Freq: Once | ORAL | Status: DC

## 2015-03-18 MED ORDER — HYDROCODONE-ACETAMINOPHEN 5-325 MG PO TABS: 1.0000 | ORAL_TABLET | ORAL | Status: DC | PRN

## 2015-03-18 MED ORDER — CYCLOBENZAPRINE HCL 10 MG PO TABS: 10.0000 mg | ORAL_TABLET | Freq: Every evening | ORAL | Status: DC | PRN

## 2015-03-18 MED ORDER — HYDROCODONE-ACETAMINOPHEN 5-325 MG PO TABS
ORAL_TABLET | ORAL | Status: AC
  Filled 2015-03-18: qty 1

## 2015-03-18 NOTE — ED Provider Notes (Signed)
CSN: [id number]     Arrival date & time 03/18/15  1035 History   First MD Initiated Contact with Patient 03/18/15 1147     Chief Complaint  Patient presents with  . Dental Pain  . Back Pain   (Consider location/radiation/quality/duration/timing/severity/associated sxs/prior Treatment) HPI  She is a 29 year old woman here for evaluation of dental pain and back pain.  She states she has had pain in her right lower tooth for the last week. She reports swelling and redness. No fevers.  The back pain is located in her right upper back. She states she does a lot of stuff during the day including cleaning and doesn't remember a specific injury, but when she laid down last night she had pain in her right upper back. It gets better with movement. No radiation. No numbness tingling or weakness in her arm. She has not taken any medication for this.  Past Medical History  Diagnosis Date  . Obesity    Past Surgical History  Procedure Laterality Date  . Breast biopsy Left 07/31/2013    Procedure: IRRIGATION AND DEBRIDMENT LEFT BREAST ABSCESS;  Surgeon: Lodema Pilot, DO;  Location: [facility];  Service: General;  Laterality: Left;  IRRIGATION AND DEBRIDEMENT LEFT BREAST ABSCESS   No family history on file. History  Substance Use Topics  . Smoking status: Current Every Day Smoker -- 1.00 packs/day    Types: Cigarettes  . Smokeless tobacco: Not on file  . Alcohol Use: No   OB History    No data available     Review of Systems  As in history of present illness  Allergies  Review of patient's allergies indicates no known allergies.  Home Medications   Prior to Admission medications   Medication Sig Start Date End Date Taking? Authorizing Provider  cephALEXin (KEFLEX) 500 MG capsule Take 1 capsule (500 mg total) by mouth 2 (two) times daily. 10/11/14   Tomasita Crumble, MD  cyclobenzaprine (FLEXERIL) 10 MG tablet Take 1 tablet (10 mg total) by mouth at bedtime as needed for muscle spasms. 03/18/15    Charm Rings, MD  doxycycline (VIBRAMYCIN) 100 MG capsule Take 1 capsule (100 mg total) by mouth 2 (two) times daily. One po bid x 7 days 10/11/14   Tomasita Crumble, MD  HYDROcodone-acetaminophen (NORCO/VICODIN) 5-325 MG per tablet Take 1 tablet by mouth every 4 (four) hours as needed for moderate pain. 03/18/15   Charm Rings, MD  ibuprofen (ADVIL,MOTRIN) 800 MG tablet Take 1 tablet (800 mg total) by mouth 3 (three) times daily. 03/18/15   Charm Rings, MD  penicillin v potassium (VEETID) 500 MG tablet Take 1 tablet (500 mg total) by mouth 4 (four) times daily. 03/18/15 03/25/15  Charm Rings, MD   BP 128/90 mmHg  Pulse 80  Temp(Src) 98 F (36.7 C) (Oral)  Resp 18 Physical Exam  Constitutional: She is oriented to person, place, and time. She appears well-developed and well-nourished. She appears distressed (looks uncomfortable).  HENT:  Mouth/Throat: Oropharynx is clear and moist.    Pulmonary/Chest: Effort normal.  Musculoskeletal:       Back:  Outlined area as the area of her pain and is tender to palpation.  Neurological: She is alert and oriented to person, place, and time.    ED Course  Procedures (including critical care time) Labs Review Labs Reviewed  POCT URINALYSIS DIP (DEVICE) - Abnormal; Notable for the following:    Hgb urine dipstick SMALL (*)    Leukocytes, UA  SMALL (*)    All other components within normal limits  URINE CULTURE  POCT PREGNANCY, URINE    Imaging Review No results found.   MDM   1. Dental infection   2. Right-sided thoracic back pain    Will treat dental infection with penicillin. Ibuprofen and Norco provided for pain. Recommended ice for the next 3 days, then ice and heat for her back. Flexeril at bedtime for her back. Follow-up with her dentist.   Charm RingsErin J Nadiah Corbit, MD 03/18/15 1236

## 2015-03-18 NOTE — Discharge Instructions (Signed)
You have a dental infection. You will need to follow up with your dentist for this. Take penicillin 1 pill 4 times a day for 10 days. Take ibuprofen 800 mg 3 times a day. Use the hydrocodone every 4-6 hours as needed for severe pain.  The back pain is from a pulled muscle. The ibuprofen and hydrocodone will help with this. Take a Flexeril at bedtime as needed. This is a muscle relaxer. Apply ice as often as you can for the next 3 days. On Monday you can alternate ice with heat.

## 2015-03-18 NOTE — ED Notes (Signed)
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

## 2015-03-18 NOTE — ED Notes (Signed)
Pt reports father is unable to pick her up; cancelled [facility]; notified Dr. Piedad ClimesHonig.

## 2015-03-19 LAB — URINE CULTURE
Colony Count: NO GROWTH
Culture: NO GROWTH

## 2015-05-09 ENCOUNTER — Emergency Department (HOSPITAL_COMMUNITY)
Admission: EM | Admit: 2015-05-09 | Discharge: 2015-05-10 | Disposition: A | Discharge status: Home / Self Care | Payer: Medicaid Other | Attending: Emergency Medicine | Admitting: Emergency Medicine

## 2015-05-09 ENCOUNTER — Encounter (HOSPITAL_COMMUNITY): Payer: Self-pay | Admitting: Emergency Medicine

## 2015-05-09 DIAGNOSIS — Z720 Tobacco use: Secondary | ICD-10-CM | POA: Diagnosis not present

## 2015-05-09 DIAGNOSIS — S5001XA Contusion of right elbow, initial encounter: Secondary | ICD-10-CM | POA: Insufficient documentation

## 2015-05-09 DIAGNOSIS — M5416 Radiculopathy, lumbar region: Secondary | ICD-10-CM

## 2015-05-09 DIAGNOSIS — Y9389 Activity, other specified: Secondary | ICD-10-CM | POA: Insufficient documentation

## 2015-05-09 DIAGNOSIS — Z792 Long term (current) use of antibiotics: Secondary | ICD-10-CM | POA: Diagnosis not present

## 2015-05-09 DIAGNOSIS — Y998 Other external cause status: Secondary | ICD-10-CM | POA: Diagnosis not present

## 2015-05-09 DIAGNOSIS — Z791 Long term (current) use of non-steroidal anti-inflammatories (NSAID): Secondary | ICD-10-CM | POA: Diagnosis not present

## 2015-05-09 DIAGNOSIS — Y929 Unspecified place or not applicable: Secondary | ICD-10-CM | POA: Insufficient documentation

## 2015-05-09 DIAGNOSIS — E669 Obesity, unspecified: Secondary | ICD-10-CM | POA: Insufficient documentation

## 2015-05-09 DIAGNOSIS — S3992XA Unspecified injury of lower back, initial encounter: Secondary | ICD-10-CM | POA: Insufficient documentation

## 2015-05-09 DIAGNOSIS — W228XXA Striking against or struck by other objects, initial encounter: Secondary | ICD-10-CM | POA: Insufficient documentation

## 2015-05-09 MED ORDER — METHOCARBAMOL 500 MG PO TABS
500.0000 mg | ORAL_TABLET | Freq: Once | ORAL | Status: AC
  Administered 2015-05-10: 500 mg via ORAL
  Filled 2015-05-09: qty 1

## 2015-05-09 MED ORDER — HYDROCODONE-ACETAMINOPHEN 5-325 MG PO TABS
1.0000 | ORAL_TABLET | Freq: Once | ORAL | Status: AC
Start: 2015-05-09 — End: 2015-05-10
  Administered 2015-05-10: 1 via ORAL
  Filled 2015-05-09: qty 1

## 2015-05-09 MED ORDER — IBUPROFEN 400 MG PO TABS
800.0000 mg | ORAL_TABLET | Freq: Once | ORAL | Status: AC
  Administered 2015-05-10: 800 mg via ORAL
  Filled 2015-05-09: qty 2

## 2015-05-09 NOTE — ED Provider Notes (Signed)
CSN: [id number]     Arrival date & time 05/09/15  2256 History  This chart was scribed for Wynetta Emery, PA, working with Marisa Severin, MD by Lyndel Safe, ED Scribe. This paitent was seen in room TR05C/TR05C and the patient's care was started at 11:38 PM.   Chief Complaint  Patient presents with  . Back Pain   The history is provided by the patient. No language interpreter was used.   HPI Comments: [name] is a 29 y.o. female who presents to the Emergency Department complaining of 10/10 lower back pain that radiates to her hips with associated difficulty walking due to pain that occurred 4 days ago on Friday. She reports she went to the urgent care where she was given an unknown injection that relieved her pain for 24 hours.  She reports she has been doing a lot of heavy lifting at work and believes this is associated with the lower back pain. She has not taken any OTC medication for her pain pta.  She has appointment with a PCP this Wednesday 5/25.   She denies numbness or weakness of extremities, fevers, dysuria, or incontinence. She also denies a history of CA or IV drug abuse.   She also notes a knot to her right elbow onset earlier today when she bumped her elbow against a hard object.     Past Medical History  Diagnosis Date  . Obesity    Past Surgical History  Procedure Laterality Date  . Breast biopsy Left 07/31/2013    Procedure: IRRIGATION AND DEBRIDMENT LEFT BREAST ABSCESS;  Surgeon: Lodema Pilot, DO;  Location: [facility];  Service: General;  Laterality: Left;  IRRIGATION AND DEBRIDEMENT LEFT BREAST ABSCESS  . Cesarean section     No family history on file. History  Substance Use Topics  . Smoking status: Current Every Day Smoker -- 1.00 packs/day    Types: Cigarettes  . Smokeless tobacco: Not on file  . Alcohol Use: No   OB History    No data available     Review of Systems A complete 10 system review of systems was obtained and is otherwise negative  except at noted in the HPI and PMH.   Allergies  Review of patient's allergies indicates no known allergies.  Home Medications   Prior to Admission medications   Medication Sig Start Date End Date Taking? Authorizing Provider  cephALEXin (KEFLEX) 500 MG capsule Take 1 capsule (500 mg total) by mouth 2 (two) times daily. 10/11/14   Tomasita Crumble, MD  cyclobenzaprine (FLEXERIL) 10 MG tablet Take 1 tablet (10 mg total) by mouth at bedtime as needed for muscle spasms. 03/18/15   Charm Rings, MD  doxycycline (VIBRAMYCIN) 100 MG capsule Take 1 capsule (100 mg total) by mouth 2 (two) times daily. One po bid x 7 days 10/11/14   Tomasita Crumble, MD  HYDROcodone-acetaminophen (NORCO/VICODIN) 5-325 MG per tablet Take 1 tablet by mouth every 4 (four) hours as needed for moderate pain. 03/18/15   Charm Rings, MD  HYDROcodone-acetaminophen (NORCO/VICODIN) 5-325 MG per tablet Take 1-2 tablets by mouth every 6 hours as needed for pain and/or cough. 05/10/15   Kamauri Kathol, PA-C  ibuprofen (ADVIL,MOTRIN) 800 MG tablet Take 1 tablet (800 mg total) by mouth 3 (three) times daily. 03/18/15   Charm Rings, MD  methocarbamol (ROBAXIN) 500 MG tablet Take 1 tablet (500 mg total) by mouth 2 (two) times daily. 05/10/15   Carrera Kiesel, PA-C   BP 128/86  mmHg  Pulse 90  Temp(Src) 98.1 F (36.7 C) (Oral)  Resp 16  Wt 203 lb (92.08 kg)  SpO2 100%  LMP 05/05/2015  Physical Exam  Constitutional: She appears well-developed and well-nourished.  HENT:  Head: Normocephalic and atraumatic.  Eyes: Conjunctivae are normal. Right eye exhibits no discharge. Left eye exhibits no discharge.  Neck: Normal range of motion.  Cardiovascular: Normal rate, regular rhythm and intact distal pulses.   Pulmonary/Chest: Effort normal. No respiratory distress.  Abdominal: Soft. There is no tenderness.  Musculoskeletal: She exhibits tenderness.       Arms: Left UE:  Shoulder with no deformity. FROM to shoulder and elbow.  No TTP of  rotator cuff musculature. 1 x 2 cm contusion to dorsal proximal forearm, no bony tenderness to palpation. Drop arm negative. Neurovascularly intact.    Neurological: She is alert. Coordination normal.  No point tenderness to percussion of lumbar spinal processes.  +right lumbar paraspinal musculature spasm and tenderness to palpation.   Strength is 5 out of 5 to bilateral lower extremities at hip and knee; extensor hallucis longus 5 out of 5. Ankle strength 5 out of 5, no clonus, neurovascularly intact. No saddle anaesthesia. Patellar reflexes are 2+ bilaterally.      Skin: Skin is warm and dry. No rash noted. She is not diaphoretic. No erythema.  Psychiatric: She has a normal mood and affect.  Nursing note and vitals reviewed.   ED Course  Procedures  DIAGNOSTIC STUDIES: Oxygen Saturation is 100% on RA, normal by my interpretation.    COORDINATION OF CARE: 11:41 PM Discussed treatment plan to order and prescribe pain medication and muscle relaxer for pain management. Also advised pt to take OTC NSAIDs for pain relief. Will get diagnostic imaging of her elbow to rule out a fracture. Advised pt to ice elbow. Pt acknowledges and agrees to treatment plan.   Labs Review Labs Reviewed - No data to display  Imaging Review No results found.   EKG Interpretation None      MDM   Final diagnoses:  Lumbar radiculopathy  Elbow contusion, right, initial encounter    Filed Vitals:   05/09/15 2306 05/10/15 0016  BP: 128/86 120/83  Pulse: 90 72  Temp: 98.1 F (36.7 C) 98.6 F (37 C)  TempSrc: Oral Oral  Resp: 16 14  Weight: 203 lb (92.08 kg)   SpO2: 100% 100%    Medications  ibuprofen (ADVIL,MOTRIN) tablet 800 mg (800 mg Oral Given 05/10/15 0000)  methocarbamol (ROBAXIN) tablet 500 mg (500 mg Oral Given 05/10/15 0000)  HYDROcodone-acetaminophen (NORCO/VICODIN) 5-325 MG per tablet 1 tablet (1 tablet Oral Given 05/10/15 0000)    [name] is a pleasant 29 y.o. female p/w  right lumbar radiculopathy, neuro exam nonfocal, also with elbow contusion. Neurovascularly intact. FROM. Pt declines XR.   Evaluation does not show pathology that would require ongoing emergent intervention or inpatient treatment. Pt is hemodynamically stable and mentating appropriately. Discussed findings and plan with patient/guardian, who agrees with care plan. All questions answered. Return precautions discussed and outpatient follow up given.   Discharge Medication List as of 05/10/2015 12:11 AM    START taking these medications   Details  !! HYDROcodone-acetaminophen (NORCO/VICODIN) 5-325 MG per tablet Take 1-2 tablets by mouth every 6 hours as needed for pain and/or cough., Print    methocarbamol (ROBAXIN) 500 MG tablet Take 1 tablet (500 mg total) by mouth 2 (two) times daily., Starting 05/10/2015, Until Discontinued, Print     !! -  Potential duplicate medications found. Please discuss with provider.       I personally performed the services described in this documentation, which was scribed in my presence.  The recorded information has been reviewed and is accurate.    Wynetta Emery, PA-C 05/10/15 1610  Marisa Severin, MD 05/10/15 [phone]

## 2015-05-09 NOTE — ED Notes (Signed)
Pt. reports low back pain radiating to both hips and upper thighs onset last Friday , pt. stated heavy lifting at work , pt. also reported mild right elbow pain .

## 2015-05-10 MED ORDER — HYDROCODONE-ACETAMINOPHEN 5-325 MG PO TABS: ORAL_TABLET | ORAL | Status: DC

## 2015-05-10 MED ORDER — METHOCARBAMOL 500 MG PO TABS: 500.0000 mg | ORAL_TABLET | Freq: Two times a day (BID) | ORAL | Status: DC

## 2015-05-10 NOTE — Discharge Instructions (Signed)
Please take ibuprofen 400mg (this is normally 2 over the counter pills) every 6 hours (take with food to minimze stomach irritation).  ° °Take robaxin and/or Vicodin for breakthrough pain, do not drink alcohol, drive, care for children or perfom other critical tasks while taking robaxin and/or Vicodin . ° °Please follow with your primary care doctor in the next 2 days for a check-up. They must obtain records for further management.  ° °Do not hesitate to return to the Emergency Department for any new, worsening or concerning symptoms.  ° °

## 2015-05-10 NOTE — ED Notes (Signed)
Patient advising she does not want xray of her elbow that she had previously request. PA Pisciotta made aware.

## 2015-09-07 ENCOUNTER — Emergency Department (HOSPITAL_COMMUNITY)
Admission: EM | Admit: 2015-09-07 | Discharge: 2015-09-07 | Disposition: A | Discharge status: Home / Self Care | Payer: Medicaid Other | Attending: Emergency Medicine | Admitting: Emergency Medicine

## 2015-09-07 ENCOUNTER — Encounter (HOSPITAL_COMMUNITY): Payer: Self-pay | Admitting: *Deleted

## 2015-09-07 DIAGNOSIS — S86912A Strain of unspecified muscle(s) and tendon(s) at lower leg level, left leg, initial encounter: Secondary | ICD-10-CM | POA: Diagnosis not present

## 2015-09-07 DIAGNOSIS — Z79899 Other long term (current) drug therapy: Secondary | ICD-10-CM | POA: Insufficient documentation

## 2015-09-07 DIAGNOSIS — Z720 Tobacco use: Secondary | ICD-10-CM | POA: Insufficient documentation

## 2015-09-07 DIAGNOSIS — E669 Obesity, unspecified: Secondary | ICD-10-CM | POA: Diagnosis not present

## 2015-09-07 DIAGNOSIS — Y9289 Other specified places as the place of occurrence of the external cause: Secondary | ICD-10-CM | POA: Insufficient documentation

## 2015-09-07 DIAGNOSIS — S8992XA Unspecified injury of left lower leg, initial encounter: Secondary | ICD-10-CM | POA: Diagnosis present

## 2015-09-07 DIAGNOSIS — X58XXXA Exposure to other specified factors, initial encounter: Secondary | ICD-10-CM | POA: Insufficient documentation

## 2015-09-07 DIAGNOSIS — Z792 Long term (current) use of antibiotics: Secondary | ICD-10-CM | POA: Insufficient documentation

## 2015-09-07 DIAGNOSIS — Y9341 Activity, dancing: Secondary | ICD-10-CM | POA: Insufficient documentation

## 2015-09-07 DIAGNOSIS — Y998 Other external cause status: Secondary | ICD-10-CM | POA: Diagnosis not present

## 2015-09-07 MED ORDER — IBUPROFEN 400 MG PO TABS: 400.0000 mg | ORAL_TABLET | Freq: Four times a day (QID) | ORAL | Status: DC | PRN

## 2015-09-07 MED ORDER — KETOROLAC TROMETHAMINE 30 MG/ML IJ SOLN
30.0000 mg | Freq: Once | INTRAMUSCULAR | Status: AC
  Administered 2015-09-07: 30 mg via INTRAMUSCULAR
  Filled 2015-09-07: qty 1

## 2015-09-07 NOTE — ED Notes (Signed)
Pt stated that she laid down on Saturday night and was unable to sleep or get comfortable, The pain is unbearable and she cant make it go away.  Pt stated that she has had this kind of pain before in her right hip.  Pt cant relieve the pain and came her for help.

## 2015-09-07 NOTE — Discharge Instructions (Signed)
Please use ibuprofen, Tylenol as needed for pain. Please follow-up with your primary care provider in one week if symptoms continue to persist.

## 2015-09-07 NOTE — ED Provider Notes (Signed)
CSN: [id number]     Arrival date & time 09/07/15  1114 History  This chart was scribed for non-physician practitioner, Eyvonne Mechanic, PA-C working with Melene Plan, DO by Gwenyth Ober, ED scribe. This patient was seen in room TR07C/TR07C and the patient's care was started at 11:36 AM   Chief Complaint  Patient presents with  . Leg Pain   The history is provided by the patient. No language interpreter was used.   HPI Comments: [name] is a 29 y.o. female who presents to the Emergency Department complaining of constant, moderate posterior left leg pain that started 5 days ago. She has tried Aspirin, ice and heat with no relief. Her pain becomes worse with walking and sitting. Pt reports onset of symptoms started while she was dancing with a rapid hip flexion and knee extension. She has a history of leg muscle spasms which were treated with IM Toradol. Pt denies numbness.   Past Medical History  Diagnosis Date  . Obesity    Past Surgical History  Procedure Laterality Date  . Breast biopsy Left 07/31/2013    Procedure: IRRIGATION AND DEBRIDMENT LEFT BREAST ABSCESS;  Surgeon: Lodema Pilot, DO;  Location: [facility];  Service: General;  Laterality: Left;  IRRIGATION AND DEBRIDEMENT LEFT BREAST ABSCESS  . Cesarean section     No family history on file. Social History  Substance Use Topics  . Smoking status: Current Every Day Smoker -- 1.00 packs/day    Types: Cigarettes  . Smokeless tobacco: None  . Alcohol Use: No   OB History    No data available     Review of Systems  All other systems reviewed and are negative.     Allergies  Review of patient's allergies indicates no known allergies.  Home Medications   Prior to Admission medications   Medication Sig Start Date End Date Taking? Authorizing Provider  cephALEXin (KEFLEX) 500 MG capsule Take 1 capsule (500 mg total) by mouth 2 (two) times daily. 10/11/14   Tomasita Crumble, MD  cyclobenzaprine (FLEXERIL) 10 MG tablet Take  1 tablet (10 mg total) by mouth at bedtime as needed for muscle spasms. 03/18/15   Charm Rings, MD  doxycycline (VIBRAMYCIN) 100 MG capsule Take 1 capsule (100 mg total) by mouth 2 (two) times daily. One po bid x 7 days 10/11/14   Tomasita Crumble, MD  HYDROcodone-acetaminophen (NORCO/VICODIN) 5-325 MG per tablet Take 1 tablet by mouth every 4 (four) hours as needed for moderate pain. 03/18/15   Charm Rings, MD  HYDROcodone-acetaminophen (NORCO/VICODIN) 5-325 MG per tablet Take 1-2 tablets by mouth every 6 hours as needed for pain and/or cough. 05/10/15   Nicole Pisciotta, PA-C  ibuprofen (ADVIL,MOTRIN) 400 MG tablet Take 1 tablet (400 mg total) by mouth every 6 (six) hours as needed. 09/07/15   Eyvonne Mechanic, PA-C  methocarbamol (ROBAXIN) 500 MG tablet Take 1 tablet (500 mg total) by mouth 2 (two) times daily. 05/10/15   Nicole Pisciotta, PA-C   BP 133/70 mmHg  Pulse 96  Temp(Src) 97.9 F (36.6 C) (Oral)  Resp 14  Ht  (1.575 m)  Wt 201 lb (91.173 kg)  BMI 36.75 kg/m2  SpO2 100% Physical Exam  Constitutional: She appears well-developed and well-nourished. No distress.  HENT:  Head: Normocephalic and atraumatic.  Eyes: Conjunctivae and EOM are normal.  Neck: Neck supple. No tracheal deviation present.  Cardiovascular: Normal rate.   Pulmonary/Chest: Effort normal. No respiratory distress.  Musculoskeletal: She exhibits tenderness. She  exhibits no edema.  TTP of the posterior aspect of the left leg No obvious swelling  Skin: Skin is warm and dry.  Psychiatric: She has a normal mood and affect. Her behavior is normal.  Nursing note and vitals reviewed.   ED Course  Procedures   DIAGNOSTIC STUDIES: Oxygen Saturation is 100% on RA, normal by my interpretation.    COORDINATION OF CARE: 11:40 AM Discussed treatment plan with pt which includes IM Toradol and OTC Ibuprofen/Tylenol as needed. Pt agreed to plan.   MDM   Final diagnoses:  Muscle strain, lower leg, left, initial encounter    Labs:    Imaging:   Consults:   Therapeutics: IM Toradol  Discharge Meds:   Assessment/Plan: Suspect pain due to muscle strain. Advised pt to use OTC Ibuprofen or Tylenol, as needed, for pain. Pt to do gentle exercise until improvement of symptoms.   I personally performed the services described in this documentation, which was scribed in my presence. The recorded information has been reviewed and is accurate.   Eyvonne Mechanic, PA-C 09/08/15 1434  Melene Plan, DO 09/08/15 2351

## 2015-10-24 ENCOUNTER — Emergency Department (HOSPITAL_COMMUNITY)
Admission: EM | Admit: 2015-10-24 | Discharge: 2015-10-24 | Disposition: A | Discharge status: Home / Self Care | Payer: Medicaid Other | Attending: Emergency Medicine | Admitting: Emergency Medicine

## 2015-10-24 ENCOUNTER — Encounter (HOSPITAL_COMMUNITY): Payer: Self-pay | Admitting: Emergency Medicine

## 2015-10-24 DIAGNOSIS — Z720 Tobacco use: Secondary | ICD-10-CM | POA: Insufficient documentation

## 2015-10-24 DIAGNOSIS — J069 Acute upper respiratory infection, unspecified: Secondary | ICD-10-CM | POA: Diagnosis not present

## 2015-10-24 DIAGNOSIS — E669 Obesity, unspecified: Secondary | ICD-10-CM | POA: Diagnosis not present

## 2015-10-24 DIAGNOSIS — L02414 Cutaneous abscess of left upper limb: Secondary | ICD-10-CM

## 2015-10-24 MED ORDER — LIDOCAINE HCL (PF) 1 % IJ SOLN: 30.0000 mL | Freq: Once | INTRAMUSCULAR | Status: DC

## 2015-10-24 MED ORDER — BENZONATATE 100 MG PO CAPS: 100.0000 mg | ORAL_CAPSULE | Freq: Three times a day (TID) | ORAL | Status: DC | PRN

## 2015-10-24 MED ORDER — BACITRACIN-NEOMYCIN-POLYMYXIN 400-5-5000 EX OINT
TOPICAL_OINTMENT | Freq: Once | CUTANEOUS | Status: AC
  Administered 2015-10-24: 1 via TOPICAL
  Filled 2015-10-24: qty 1

## 2015-10-24 MED ORDER — IBUPROFEN 400 MG PO TABS: 400.0000 mg | ORAL_TABLET | Freq: Four times a day (QID) | ORAL | Status: DC | PRN

## 2015-10-24 MED ORDER — DOXYCYCLINE HYCLATE 100 MG PO CAPS: 100.0000 mg | ORAL_CAPSULE | Freq: Two times a day (BID) | ORAL | Status: DC

## 2015-10-24 MED ORDER — LIDOCAINE HCL 1 % IJ SOLN
INTRAMUSCULAR | Status: AC
  Administered 2015-10-24: 3 mL
  Filled 2015-10-24: qty 20

## 2015-10-24 NOTE — ED Notes (Signed)
Per pt, states she was bit by something on left wrist-also complaining of cold symptoms-congestion

## 2015-10-24 NOTE — ED Provider Notes (Signed)
CSN: [id number]     Arrival date & time 10/24/15  [id number] History   First MD Initiated Contact with Patient 10/24/15 [phone]     Chief Complaint  Patient presents with  . abscess/cold symptoms     Patient is a 29 y.o. female presenting with abscess. The history is provided by the patient.  Abscess  the patient presents to the emergency room with complaints of a tender swollen area on her left wrist. Symptoms started a few days ago. She thinks she may have bitten by a bug or something. She noticed a small lump that is tender and swollen. She tried to squeeze some pus out of it the other day but did not have much success. She denies any trouble with fevers or chills. 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  She also complains of some cold type symptoms the last several days to week. Her child was recently ill with similar symptoms. She has been coughing and has had nasal congestion. She's had some mild throat irritation. She denies any trouble with fevers or chills or shortness of breath.  Past Medical History  Diagnosis Date  . Obesity    Past Surgical History  Procedure Laterality Date  . Breast biopsy Left 07/31/2013    Procedure: IRRIGATION AND DEBRIDMENT LEFT BREAST ABSCESS;  Surgeon: Lodema PilotBrian Layton, DO;  Location: [facility];  Service: General;  Laterality: Left;  IRRIGATION AND DEBRIDEMENT LEFT BREAST ABSCESS  . Cesarean section     No family history on file. Social History  Substance Use Topics  . Smoking status: Current Every Day Smoker -- 1.00 packs/day    Types: Cigarettes  . Smokeless tobacco: None  . Alcohol Use: No   OB History    No data available     Review of Systems  All other systems reviewed and are negative.     Allergies  Review of patient's allergies indicates no known allergies.  Home Medications   Prior to Admission medications   Medication Sig Start Date End Date Taking? Authorizing Provider  diphenhydrAMINE (SOMINEX) 25 MG tablet Take  25-50 mg by mouth at bedtime as needed for allergies or sleep.   Yes Historical Provider, MD  DM-Phenylephrine-Acetaminophen (ALKA-SELTZER PLS SINUS & COUGH PO) Take 1 each by mouth every 6 (six) hours as needed (cold symptoms).   Yes Historical Provider, MD  Pseudoeph-Doxylamine-DM-APAP (NYQUIL PO) Take 30 mLs by mouth at bedtime as needed (sleep, cold symptoms).   Yes Historical Provider, MD  ibuprofen (ADVIL,MOTRIN) 400 MG tablet Take 1 tablet (400 mg total) by mouth every 6 (six) hours as needed. Patient not taking: Reported on 10/24/2015 09/07/15   Brianna MechanicJeffrey Hedges, PA-C   BP 136/83 mmHg  Pulse 86  Temp(Src) 98.7 F (37.1 C) (Oral)  Resp 16  SpO2 100%  LMP 10/21/2015 Physical Exam  Constitutional: She appears well-developed and well-nourished. No distress.  HENT:  Head: Normocephalic and atraumatic.  Right Ear: External ear normal.  Left Ear: External ear normal.  Eyes: Conjunctivae are normal. Right eye exhibits no discharge. Left eye exhibits no discharge. No scleral icterus.  Neck: Neck supple. No tracheal deviation present.  Cardiovascular: Normal rate, regular rhythm and intact distal pulses.   Pulmonary/Chest: Effort normal and breath sounds normal. No stridor. No respiratory distress. She has no wheezes. She has no rales.  Abdominal: Soft. Bowel sounds are normal. She exhibits no distension. There is no tenderness. There is no rebound and no guarding.  Musculoskeletal: She exhibits tenderness.  She exhibits no edema.  Small less than 1 cm sized raised tender nodule on the dorsal aspect of the left wrist approximately 3 cm from the extensor crease  Neurological: She is alert. She has normal strength. No cranial nerve deficit (no facial droop, extraocular movements intact, no slurred speech) or sensory deficit. She exhibits normal muscle tone. She displays no seizure activity. Coordination normal.  Skin: Skin is warm and dry. No rash noted.  Psychiatric: She has a normal mood and  affect.  Nursing note and vitals reviewed.   ED Course  Procedures  INCISION AND DRAINAGE Performed by: [email or web address],UEA Consent: Verbal consent obtained. Risks and benefits: risks, benefits and alternatives were discussed Type: abscess  Body area: wrist  Anesthesia: local infiltration  Incision was made with a scalpel.  Local anesthetic: lidocaine 1% w/o epinephrine  Anesthetic total: 0.5 ml  Complexity: complex, bedside US utilized to localize  Blunt dissection to break up loculations  Drainage: purulent  Drainage amount: scant  Packing material: 1/4 in iodoform gauze  Patient tolerance: Patient tolerated the procedure well with no immediate complications.      MDM   Final diagnoses:  Abscess of arm, left  URI, acute    Patient has a small cutaneous abscess. The area was incised and a small amount of pus was released.  Coughing cold symptoms are consistent with a viral URI.  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 Rx doxycycline. Follow up primary care doctor urgent care    Brianna Dibbles, MD 10/24/15 [phone]

## 2015-10-24 NOTE — ED Notes (Signed)
Patient is alert and oriented x3.  She was given DC instructions and follow up visit instructions.  Patient gave verbal understanding. She was DC ambulatory under her own power to home.  V/S stable.  He was not showing any signs of distress on DC 

## 2015-10-24 NOTE — Discharge Instructions (Signed)

## 2016-02-02 ENCOUNTER — Emergency Department (HOSPITAL_COMMUNITY)
Admission: EM | Admit: 2016-02-02 | Discharge: 2016-02-02 | Disposition: A | Discharge status: Home / Self Care | Payer: Medicaid Other | Attending: Emergency Medicine | Admitting: Emergency Medicine

## 2016-02-02 ENCOUNTER — Encounter (HOSPITAL_COMMUNITY): Payer: Self-pay | Admitting: Emergency Medicine

## 2016-02-02 DIAGNOSIS — E669 Obesity, unspecified: Secondary | ICD-10-CM | POA: Insufficient documentation

## 2016-02-02 DIAGNOSIS — R059 Cough, unspecified: Secondary | ICD-10-CM

## 2016-02-02 DIAGNOSIS — Z792 Long term (current) use of antibiotics: Secondary | ICD-10-CM | POA: Insufficient documentation

## 2016-02-02 DIAGNOSIS — J029 Acute pharyngitis, unspecified: Secondary | ICD-10-CM | POA: Insufficient documentation

## 2016-02-02 DIAGNOSIS — F17210 Nicotine dependence, cigarettes, uncomplicated: Secondary | ICD-10-CM | POA: Diagnosis not present

## 2016-02-02 DIAGNOSIS — R52 Pain, unspecified: Secondary | ICD-10-CM

## 2016-02-02 DIAGNOSIS — R05 Cough: Secondary | ICD-10-CM | POA: Diagnosis present

## 2016-02-02 MED ORDER — IBUPROFEN 800 MG PO TABS: 800.0000 mg | ORAL_TABLET | Freq: Three times a day (TID) | ORAL | Status: DC

## 2016-02-02 MED ORDER — BENZONATATE 100 MG PO CAPS: 100.0000 mg | ORAL_CAPSULE | Freq: Three times a day (TID) | ORAL | Status: DC

## 2016-02-02 MED ORDER — IBUPROFEN 800 MG PO TABS
800.0000 mg | ORAL_TABLET | Freq: Once | ORAL | Status: AC
  Administered 2016-02-02: 800 mg via ORAL
  Filled 2016-02-02: qty 1

## 2016-02-02 MED ORDER — METHOCARBAMOL 500 MG PO TABS: 500.0000 mg | ORAL_TABLET | Freq: Two times a day (BID) | ORAL | Status: DC

## 2016-02-02 MED ORDER — BENZONATATE 100 MG PO CAPS
100.0000 mg | ORAL_CAPSULE | Freq: Once | ORAL | Status: AC
  Administered 2016-02-02: 100 mg via ORAL
  Filled 2016-02-02: qty 1

## 2016-02-02 NOTE — ED Notes (Signed)
MD at bedside. 

## 2016-02-02 NOTE — ED Provider Notes (Signed)
CSN: [id number]     Arrival date & time 02/02/16  1420 History  By signing my name below, I, [name], attest that this documentation has been prepared under the direction and in the presence of Shawn Joy PA-C. Electronically Signed: Bethel Johnston, ED Scribe. 02/02/2016 2:47 PM   Chief Complaint  Patient presents with  . Cough  . Sore Throat   The history is provided by the patient. No language interpreter was used.   [name] is a 30 y.o. female who presents to the Emergency Department complaining of a new productive cough with onset yesterday. Associated symptoms include temperature up to 100 F, chills, and body aches. Tylenol, Benadryl, and Alka Seltzer have provided insufficient relief in her symptoms at home. Pt denies nausea/vomiting, abdominal pain, chest pain, shortness of breath, or any other complaints.  Past Medical History  Diagnosis Date  . Obesity    Past Surgical History  Procedure Laterality Date  . Breast biopsy Left 07/31/2013    Procedure: IRRIGATION AND DEBRIDMENT LEFT BREAST ABSCESS;  Surgeon: Lodema Pilot, DO;  Location: [facility];  Service: General;  Laterality: Left;  IRRIGATION AND DEBRIDEMENT LEFT BREAST ABSCESS  . Cesarean section     No family history on file. Social History  Substance Use Topics  . Smoking status: Current Every Day Smoker -- 1.00 packs/day    Types: Cigarettes  . Smokeless tobacco: None  . Alcohol Use: No   OB History    No data available     Review of Systems  Constitutional: Positive for chills. Negative for fever (Tmax of 100).  Respiratory: Positive for cough. Negative for shortness of breath.   Cardiovascular: Negative for chest pain.  Gastrointestinal: Negative for nausea, vomiting and abdominal pain.  Musculoskeletal: Positive for myalgias.  Skin: Negative for color change and pallor.  Neurological: Negative for dizziness, light-headedness and headaches.  All other systems reviewed and are  negative.  Allergies  Review of patient's allergies indicates no known allergies.  Home Medications   Prior to Admission medications   Medication Sig Start Date End Date Taking? Authorizing Provider  acetaminophen (TYLENOL) 500 MG tablet Take 1,000 mg by mouth every 6 (six) hours as needed for moderate pain or headache.   Yes Historical Provider, MD  DM-Phenylephrine-Acetaminophen (ALKA-SELTZER PLS SINUS & COUGH PO) Take 1 each by mouth every 6 (six) hours as needed (cold symptoms).   Yes Historical Provider, MD  Pseudoeph-Doxylamine-DM-APAP (NYQUIL PO) Take 2 capsules by mouth at bedtime as needed (sleep, cold symptoms).    Yes Historical Provider, MD  benzonatate (TESSALON) 100 MG capsule Take 1 capsule (100 mg total) by mouth every 8 (eight) hours. 02/02/16   Shawn C Joy, PA-C  doxycycline (VIBRAMYCIN) 100 MG capsule Take 1 capsule (100 mg total) by mouth 2 (two) times daily. 10/24/15   Linwood Dibbles, MD  ibuprofen (ADVIL,MOTRIN) 800 MG tablet Take 1 tablet (800 mg total) by mouth 3 (three) times daily. 02/02/16   Shawn C Joy, PA-C  methocarbamol (ROBAXIN) 500 MG tablet Take 1 tablet (500 mg total) by mouth 2 (two) times daily. 02/02/16   Shawn C Joy, PA-C   BP 144/105 mmHg  Pulse 108  Temp(Src) 99.8 F (37.7 C) (Oral)  Resp 20  SpO2 98% Physical Exam  Constitutional: She appears well-developed and well-nourished. No distress.  HENT:  Head: Normocephalic and atraumatic.  Mouth/Throat: Oropharynx is clear and moist.  Eyes: Conjunctivae are normal. Pupils are equal, round, and reactive to light.  Neck:  Normal range of motion. Neck supple.  Cardiovascular: Normal rate, regular rhythm, normal heart sounds and intact distal pulses.   Pulmonary/Chest: Effort normal and breath sounds normal. No respiratory distress. She exhibits no tenderness.  Abdominal: Soft. Bowel sounds are normal. There is no tenderness.  Musculoskeletal: She exhibits no edema.  Lymphadenopathy:    She has no cervical  adenopathy.  Neurological: She is alert.  Skin: Skin is warm and dry.  Psychiatric: She has a normal mood and affect. Her behavior is normal.  Nursing note and vitals reviewed.   ED Course  Procedures (including critical care time) DIAGNOSTIC STUDIES: Oxygen Saturation is 98% on RA,  normal by my interpretation.    COORDINATION OF CARE: 2:45 PM Discussed treatment plan which includes Tessalon and ibuprofen with pt at bedside and pt agreed to plan.  Labs Review Labs Reviewed - No data to display  Imaging Review No results found.    EKG Interpretation None      MDM   Final diagnoses:  Cough  Sore throat  Body aches    [name] presents with cough, body aches, subjective fever, and chills since yesterday.  The patient's symptoms are consistent with a viral illness. Patient is nontoxic appearing, afebrile, not tachycardic on my exam (pulse 96), not tachypneic, maintains SPO2 of 98% on room air, and is in no apparent distress. Patient has no signs of sepsis or other serious or life-threatening condition. Patient has no indications for imaging or labs at this time, however, if symptoms persist a chest x-ray may be warranted. Patient responded well to conservative management here in the ED. The patient was given instructions for home care as well as return precautions. Patient voices understanding of these instructions, accepts the plan, and is comfortable with discharge.  I personally performed the services described in this documentation, which was scribed in my presence. The recorded information has been reviewed and is accurate.    Brianna Pancoast, PA-C 02/03/16 [id number]  Brianna Kaplan, MD 02/04/16 [phone]

## 2016-02-02 NOTE — ED Notes (Signed)
Pt reports cough, sore throat, body aches, headache since yesterday.  Fevers at home.  Denies NVD.

## 2016-02-02 NOTE — ED Notes (Signed)
Per EMS from home c/o of fever, chills, cough, sneezing x 2 days BP 124/74, HR 104, Room Air 98%. Reports fever at home.

## 2016-02-02 NOTE — Progress Notes (Signed)
Entered in d/c instructions  [facility] Schedule an appointment as soon as possible for a visit As needed This is your assigned Medicaid [place] access doctor If you prefer another contact DSS [phone] DSS assigned your doctor *You may receive a bill if you go to any family Dr not assigned to you [address] [place] [address] [phone] Medicaid [place] Access Covered Patient [place]: [phone] [address] [place], [place] [address] [email or web address] Use this website to assist with understanding your coverage & to renew application As a Medicaid client you MUST contact DSS/SSI each time you change address, move to another [place] county or another state to keep your address updated  [place] Medicaid Transportation to Dr appts if you are have full Medicaid: [phone], [phone]

## 2016-02-02 NOTE — Discharge Instructions (Signed)
You have been seen today for cough, sore throat, and body aches. It is likely that your symptoms are caused by a virus. Viruses do not require antibiotics. Treatment is supportive care. Drink plenty of fluids and get plenty of rest. Take ibuprofen for pain or fever. Take Robaxin for muscle cramps. Warm liquids or Chloraseptic spray may help ease the sore throat. Follow up with PCP as needed. Return to ED should symptoms worsen.   Emergency Department Resource Guide 1) Find a Doctor and Pay Out of Pocket Although you won't have to find out who is covered by your insurance plan, it is a good idea to ask around and get recommendations. You will then need to call the office and see if the doctor you have chosen will accept you as a new patient and what types of options they offer for patients who are self-pay. Some doctors offer discounts or will set up payment plans for their patients who do not have insurance, but you will need to ask so you aren't surprised when you get to your appointment.  2) Contact Your Local Health Department Not all health departments have doctors that can see patients for sick visits, but many do, so it is worth a call to see if yours does. If you don't know where your local health department is, you can check in your phone book. The CDC also has a tool to help you locate your state's health department, and many state websites also have listings of all of their local health departments.  3) Find a Walk-in Clinic If your illness is not likely to be very severe or complicated, you may want to try a walk in clinic. These are popping up all over the country in pharmacies, drugstores, and shopping centers. They're usually staffed by nurse practitioners or physician assistants that have been trained to treat common illnesses and complaints. They're usually fairly quick and inexpensive. However, if you have serious medical issues or chronic medical problems, these are probably not your  best option.  No Primary Care Doctor: - Call Health Connect at  [phone] - they can help you locate a primary care doctor that  accepts your insurance, provides certain services, etc. - Physician Referral Service- [phone]  Chronic Pain Problems: Organization         Address  Phone   Notes  [facility]  [phone] Patients need to be referred by their primary care doctor.   Medication Assistance: Organization         Address  Phone   Notes  [facility] Medication [facility] [address] [place] [place], [place] [address] [phone] --Must be a resident of [facility] -- Must have NO insurance coverage whatsoever (no Medicaid/ Medicare, etc.) -- The pt. MUST have a primary care doctor that directs their care regularly and follows them in the community   MedAssist  [phone]   [employer]  [phone]    Agencies that provide inexpensive medical care: Organization         Address  Phone   Notes  [facility]  [phone]   [facility]    [phone]   [facility] [address] [place], [place] [address] [phone]   [facility] [address] [place] [place] [phone]   Planned Parenthood    [phone]   [facility]    [phone]  [facility]  [address], [place] Phone:  [phone], Fax:  [phone] Hours of Operation:  9 am - 6 pm, M-F.  Also accepts Medicaid/Medicare and self-pay.  [facility] for [place] [address], [place] Phone: [phone], Fax: [phone]. Hours of Operation:  8:30 am - 5:30 pm, M-F.  Also accepts Medicaid and self-pay.  [facility] [place] [address] [place] Phone: [phone]   [place], [place], [place] [phone], Ext. [phone] Mondays & Thursdays: 7-9 AM.  First 15  patients are seen on a first come, first serve basis.    [place] Providers:  Organization         Address  Phone   Notes  [facility] [address],  [phone] Also accepts self-pay patients.  [facility] [address], [place]  [phone]   [place], [address], [place] [phone]   [facility] [address], [place] [phone]   Lucianne Lei [address], [place]   [phone] Only accepts [place] Access [place] patients after they have their name applied to their card.   Self-Pay (no insurance) in [facility]:  Organization         Address  Phone   Notes  Sickle Cell Patients, [facility] Internal Medicine [place] [phone]   [facility] Care [place] [phone]   [facility] [place]  [place], [place], [place] [phone]   [facility]/Dr. Osei-Bonsu  [address] [place] or [address], [place] [phone] Phone number for both [place] and [place] locations is the same.  Urgent Medical and [facility] [address], [place] [phone]   [facility] [address], [place] or [address] [phone]   [facility] [address], [place] [phone], phone; [phone], fax Sees patients 1st and 3rd Saturday of every month.  Must not qualify for public or private insurance (i.e. Medicaid, Medicare, [place] Health Choice, Veterans' Benefits)  Household income should be no more than 200% of the poverty level The clinic cannot treat you if you are pregnant or think you are pregnant  Sexually transmitted diseases are not treated at the clinic.    Dental  Care: Organization         Address  Phone  Notes  [facility] Regino Ramirez [phone] Accepts children up to age 62 who are enrolled in [place] or [place]; pregnant women with a Medicaid card; and children who have applied for Medicaid or [place] Health Choice, but were declined, whose parents can pay a reduced fee at time of service.  [facility] Department of [facility]  [address], [place] [phone] Accepts children up to age 71 who are enrolled in [place] or [place]; pregnant women with a Medicaid card; and children who have applied for Medicaid or [place] Health Choice, but were declined, whose parents can pay a reduced fee at time of service.  [place]  [phone]  [place] [phone] Patients are seen by appointment only. Walk-ins are not accepted. [place] will see patients 26 years of age and older. Monday - Tuesday (8am-5pm) Most Wednesdays (8:30-5pm) $30 per visit, cash only  [facility] Adult Dental Access PROGRAM  [address], [facility] [phone] Patients are seen by appointment only. Walk-ins are not accepted. [place] will see patients 62 years of age and older. One Wednesday Evening (Monthly: Volunteer Based).  $30 per visit, cash only  [place]  [phone] for adults; Children under age 39, call Graduate Pediatric Dentistry at [phone]. Children aged 75-14, please call [phone] to request a pediatric application.  Dental services are provided in all areas of dental care including fillings, crowns and bridges, complete and partial dentures, implants, gum treatment, root canals, and extractions. Preventive care is also provided. Treatment is provided to both adults and children. Patients are selected via a lottery and there is often a waiting list.   [facility] [address] [place]  [phone] [email or web address]   [facility] [address] [place], [place] [phone], Ext. [phone] Second and Fourth Thursday of each month, opens at 6:30 AM; Clinic ends at 9 AM.  Patients are seen on a first-come first-served basis, and a limited number are seen during each clinic.   [facility]  [address] Hillard Danker [place], [place] [phone]   Eligibility Requirements You must have lived in [place], [place], or [place] counties for at least the last three months.   You cannot be eligible for state or federal sponsored [employer], including [employer], [place], or [employer].   You generally cannot be eligible for healthcare insurance through your employer.    How to apply: Eligibility screenings are held every Tuesday and Wednesday afternoon from 1:00 pm until 4:00 pm. You do not need an appointment for the interview!  [facility] [address] [place], [place]   [place]  [place] Department  [place]  [phone]    Behavioral Health Resources in the Community: Intensive Outpatient Programs Organization         Address  Phone  Notes  [place]. [address] [place], [place] [phone]   [facility] Outpatient [address] [place], [place]   ADS: Alcohol & Drug [facility] [address], [place], [place]   [place] [address]  [place], [place] or [phone]   Substance Abuse Resources Organization         Address  Phone  Notes  Alcohol and Drug Services  [phone]   [place]  [phone]   [facility] [place]   Chinita Pester  [phone]   Residential & Outpatient Substance Abuse Program  [phone]    Psychological Services Organization         Address  Phone  Notes  [facility] [place]  [place]  [phone]   [place] [address] [place] or [phone]    Mobile Crisis Teams Organization         Address  Phone  Notes  [facility], Mobile Crisis Care Unit  [phone]   Assertive Psychotherapeutic Services  [address] [place], [place]  [facility] [address], [address] [place] [phone]    Self-Help/Support Groups Organization         Address  Phone             Notes  [facility] [place] - variety of support groups  Jessie Call for more information  Narcotics Anonymous (NA), [facility]aring [facility] [address], [employer]  2 meetings at this location   Special educational needs teacher         Address  Phone  Notes  ASAP Residential Treatment [place],    [place]  [phone]   [facility]  [address], [place] [phone], [place], [place]   [place], [place] [phone] Admissions: 8am-3pm M-F  Incentives Substance [place] [address]    [place], [place] [phone]   [facility] [address] [place], [place], [place]   [facility] [address]  [place], [place]   [facility] Programs - Intensive Outpatient [address] Kristeen [address], [place], [place]   [facility] [place].,  [place], [place] [phone] or [phone]   [facility] Treatment Services (RTS) [address] [place], [place] Accepts Medicaid  [facility] [place] [address]  [place] [phone] Substance Abuse/Addiction Treatment   [facility] Organization         Address  Phone  Notes  [facility]  [phone]   Domenic Schwab, PhD [address] [place]lis [place], [place]   [phone] or [phone]   [place], [place] [phone]   [facility] [address] [place], [place] [phone] Insurance/Medicaid/sponsorship through [facility] and Families [address], [address] [place]                                    [place], [place] [phone] [place] [address], [place] [phone]    Dr. Adele Schilder  [phone]   [facility] Dept. 1) [address], [place] 2) [place] 3)  [place], [place] [phone]  [phone]   [place] [phone] or [phone] (After Hours)

## 2016-02-05 ENCOUNTER — Encounter (HOSPITAL_COMMUNITY): Payer: Self-pay | Admitting: Emergency Medicine

## 2016-02-05 ENCOUNTER — Emergency Department (HOSPITAL_COMMUNITY)
Admission: EM | Admit: 2016-02-05 | Discharge: 2016-02-05 | Disposition: A | Discharge status: Home / Self Care | Payer: Medicaid Other | Attending: Emergency Medicine | Admitting: Emergency Medicine

## 2016-02-05 DIAGNOSIS — E669 Obesity, unspecified: Secondary | ICD-10-CM | POA: Diagnosis not present

## 2016-02-05 DIAGNOSIS — Z79899 Other long term (current) drug therapy: Secondary | ICD-10-CM | POA: Insufficient documentation

## 2016-02-05 DIAGNOSIS — J340 Abscess, furuncle and carbuncle of nose: Secondary | ICD-10-CM | POA: Diagnosis present

## 2016-02-05 DIAGNOSIS — Z791 Long term (current) use of non-steroidal anti-inflammatories (NSAID): Secondary | ICD-10-CM | POA: Diagnosis not present

## 2016-02-05 DIAGNOSIS — F17210 Nicotine dependence, cigarettes, uncomplicated: Secondary | ICD-10-CM | POA: Insufficient documentation

## 2016-02-05 MED ORDER — SULFAMETHOXAZOLE-TRIMETHOPRIM 800-160 MG PO TABS: 1.0000 | ORAL_TABLET | Freq: Two times a day (BID) | ORAL | Status: AC

## 2016-02-05 MED ORDER — SULFAMETHOXAZOLE-TRIMETHOPRIM 800-160 MG PO TABS
1.0000 | ORAL_TABLET | Freq: Once | ORAL | Status: AC
  Administered 2016-02-05: 1 via ORAL
  Filled 2016-02-05: qty 1

## 2016-02-05 MED ORDER — OXYCODONE-ACETAMINOPHEN 5-325 MG PO TABS: 1.0000 | ORAL_TABLET | ORAL | Status: DC | PRN

## 2016-02-05 MED ORDER — OXYCODONE-ACETAMINOPHEN 5-325 MG PO TABS
1.0000 | ORAL_TABLET | Freq: Once | ORAL | Status: AC
  Administered 2016-02-05: 1 via ORAL
  Filled 2016-02-05: qty 1

## 2016-02-05 NOTE — Discharge Instructions (Signed)
TAKE MEDICATIONS AS PRESCRIBED. CONTINUE IBUPROFEN FOR INFLAMMATION. FOLLOW UP WITH YOUR DOCTOR FOR RECHECK IN 2-3 DAYS.

## 2016-02-05 NOTE — ED Notes (Signed)
Pt c/o abscess inside L nare. Small amount of drainage noted. Swelling noted to L side of face.

## 2016-02-05 NOTE — ED Provider Notes (Signed)
CSN: [id number]     Arrival date & time 02/05/16  0211 History   First MD Initiated Contact with Patient 02/05/16 [phone]     Chief Complaint  Patient presents with  . Abscess     (Consider location/radiation/quality/duration/timing/severity/associated sxs/prior Treatment) Patient is a 30 y.o. female presenting with abscess. The history is provided by the patient. No language interpreter was used.  Abscess Location:  Face Facial abscess location:  Nose Abscess quality: draining   Red streaking: no   Duration:  2 days Progression:  Worsening Associated symptoms: no fever, no headaches, no nausea and no vomiting   Associated symptoms comment:  Painful, draining sore in the left nostril for 2 days. Now with left facial swelling and increased pain. No fever but she reports chills. No vomiting.   Past Medical History  Diagnosis Date  . Obesity    Past Surgical History  Procedure Laterality Date  . Breast biopsy Left 07/31/2013    Procedure: IRRIGATION AND DEBRIDMENT LEFT BREAST ABSCESS;  Surgeon: Lodema Pilot, DO;  [facility];  Service: General;  Laterality: Left;  IRRIGATION AND DEBRIDEMENT LEFT BREAST ABSCESS  . Cesarean section     No family history on file. Social History  Substance Use Topics  . Smoking status: Current Every Day Smoker -- 1.00 packs/day    Types: Cigarettes  . Smokeless tobacco: None  . Alcohol Use: No   OB History    No data available     Review of Systems  Constitutional: Positive for chills. Negative for fever.  HENT: Negative for sore throat and trouble swallowing.   Gastrointestinal: Negative for nausea and vomiting.  Musculoskeletal: Negative for neck stiffness.  Skin: Positive for wound.  Neurological: Negative for headaches.      Allergies  Review of patient's allergies indicates no known allergies.  Home Medications   Prior to Admission medications   Medication Sig Start Date End Date Taking? Authorizing Provider  benzonatate  (TESSALON) 100 MG capsule Take 1 capsule (100 mg total) by mouth every 8 (eight) hours. 02/02/16  Yes Shawn C Joy, PA-C  ibuprofen (ADVIL,MOTRIN) 800 MG tablet Take 1 tablet (800 mg total) by mouth 3 (three) times daily. 02/02/16  Yes Shawn C Joy, PA-C  methocarbamol (ROBAXIN) 500 MG tablet Take 1 tablet (500 mg total) by mouth 2 (two) times daily. 02/02/16  Yes Shawn C Joy, PA-C  doxycycline (VIBRAMYCIN) 100 MG capsule Take 1 capsule (100 mg total) by mouth 2 (two) times daily. Patient not taking: Reported on 02/05/2016 10/24/15   Linwood Dibbles, MD   BP 136/91 mmHg  Pulse 86  Temp(Src) 98.3 F (36.8 C) (Oral)  Resp 18  Ht  (1.575 m)  Wt 90.266 kg  BMI 36.39 kg/m2  SpO2 99% Physical Exam  Constitutional: She appears well-developed and well-nourished. No distress.  HENT:  Abscess on left nostril floor with small amount of drainage. Distal nose is red, swollen with some extension of swelling to upper lip. Widespread dental decay without visualized apical abscess.   Neck: Normal range of motion.  Pulmonary/Chest: Effort normal.  Musculoskeletal: Normal range of motion.  Lymphadenopathy:    She has no cervical adenopathy.  Skin: Skin is warm and dry. There is erythema.  Psychiatric: She has a normal mood and affect.    ED Course  Procedures (including critical care time) Labs Review Labs Reviewed - No data to display  Imaging Review No results found. I have personally reviewed and evaluated these images and lab  results as part of my medical decision-making.   EKG Interpretation None      MDM   Final diagnoses:  None    1. Intranasal abscess  Abscess on interior of nostril, actively draining. Will start on abx, provide pain relief and encourage PCP follow up in 2 days.     Elpidio Anis, PA-C 02/05/16 0350  April Palumbo, MD 02/05/16 [phone]

## 2016-03-20 ENCOUNTER — Emergency Department (HOSPITAL_COMMUNITY)
Admission: EM | Admit: 2016-03-20 | Discharge: 2016-03-20 | Disposition: A | Discharge status: Home / Self Care | Payer: Medicaid Other | Attending: Emergency Medicine | Admitting: Emergency Medicine

## 2016-03-20 ENCOUNTER — Encounter (HOSPITAL_COMMUNITY): Payer: Self-pay | Admitting: Emergency Medicine

## 2016-03-20 DIAGNOSIS — R220 Localized swelling, mass and lump, head: Secondary | ICD-10-CM | POA: Diagnosis present

## 2016-03-20 DIAGNOSIS — L0201 Cutaneous abscess of face: Secondary | ICD-10-CM | POA: Diagnosis not present

## 2016-03-20 DIAGNOSIS — J029 Acute pharyngitis, unspecified: Secondary | ICD-10-CM | POA: Insufficient documentation

## 2016-03-20 DIAGNOSIS — E669 Obesity, unspecified: Secondary | ICD-10-CM | POA: Diagnosis not present

## 2016-03-20 DIAGNOSIS — Z79899 Other long term (current) drug therapy: Secondary | ICD-10-CM | POA: Diagnosis not present

## 2016-03-20 DIAGNOSIS — F17210 Nicotine dependence, cigarettes, uncomplicated: Secondary | ICD-10-CM | POA: Diagnosis not present

## 2016-03-20 LAB — RAPID STREP SCREEN (MED CTR MEBANE ONLY): STREPTOCOCCUS, GROUP A SCREEN (DIRECT): NEGATIVE

## 2016-03-20 MED ORDER — IBUPROFEN 800 MG PO TABS: 800.0000 mg | ORAL_TABLET | Freq: Three times a day (TID) | ORAL | Status: DC

## 2016-03-20 MED ORDER — CLINDAMYCIN HCL 150 MG PO CAPS
150.0000 mg | ORAL_CAPSULE | Freq: Once | ORAL | Status: AC
  Administered 2016-03-20: 150 mg via ORAL
  Filled 2016-03-20: qty 1

## 2016-03-20 MED ORDER — IBUPROFEN 800 MG PO TABS
800.0000 mg | ORAL_TABLET | Freq: Once | ORAL | Status: AC
  Administered 2016-03-20: 800 mg via ORAL
  Filled 2016-03-20: qty 1

## 2016-03-20 MED ORDER — CLINDAMYCIN HCL 150 MG PO CAPS: 150.0000 mg | ORAL_CAPSULE | Freq: Four times a day (QID) | ORAL | Status: DC

## 2016-03-20 NOTE — Discharge Instructions (Signed)
Abscess Apply warm compresses.  Return for recheck within 2 days.  Return for increased swelling or fever. Antibiotic can cause diarrhea. An abscess (boil or furuncle) is an infected area on or under the skin. This area is filled with yellowish-white fluid (pus) and other material (debris). HOME CARE   Only take medicines as told by your doctor.  If you were given antibiotic medicine, take it as directed. Finish the medicine even if you start to feel better.  If gauze is used, follow your doctor's directions for changing the gauze.  To avoid spreading the infection:  Keep your abscess covered with a bandage.  Wash your hands well.  Do not share personal care items, towels, or whirlpools with others.  Avoid skin contact with others.  Keep your skin and clothes clean around the abscess.  Keep all doctor visits as told. GET HELP RIGHT AWAY IF:   You have more pain, puffiness (swelling), or redness in the wound site.  You have more fluid or blood coming from the wound site.  You have muscle aches, chills, or you feel sick.  You have a fever. MAKE SURE YOU:   Understand these instructions.  Will watch your condition.  Will get help right away if you are not doing well or get worse.   This information is not intended to replace advice given to you by your health care provider. Make sure you discuss any questions you have with your health care provider.   Document Released: 05/21/2008 Document Revised: 06/03/2012 Document Reviewed: 02/16/2012 Elsevier Interactive Patient Education [employer]2016 Elsevier Inc.

## 2016-03-20 NOTE — ED Notes (Signed)
Patient presents for top lip swelling x4 days with bilateral neck swelling. Patient able to swallow own secretions but c/o painful swallowing, no vocal changes, speaking in complete sentences. Rates pain 10/10.

## 2016-03-20 NOTE — ED Provider Notes (Signed)
CSN: [id number]     Arrival date & time 03/20/16  1812 History  By signing my name below, I, [name], attest that this documentation has been prepared under the direction and in the presence of Catha GosselinHanna Patel-Mills, PA-C Electronically Signed: Soijett Johnston, ED Scribe. 03/20/2016. 6:44 PM.   Chief Complaint  Patient presents with  . Oral Swelling  . Sore Throat   The history is provided by the patient. No language interpreter was used.   HPI Comments: [name] is a 30 y.o. female who presents to the Emergency Department complaining of sore throat onset today. Pt notes that she has had upper lip swelling x 2 days with bilateral neck swelling. Pt reports that she has had dental issues in the past. Pt states that she has taken benadryl and she notes that after taking the medication, her lips appeared more swollen. Pt reports that she had a bump to her nose that she scratched and she thinks that there is an infection stemming from that. Pt also reports that she has been seen in the past for the abscess to her nose and Rx bactrim that didn't alleviate her symptoms. She states that she is having associated symptoms of painful swallowing. She states that she has tried benadryl with no relief for her symptoms. She denies trouble swallowing, drainage, fever, chills, and any other symptoms. Denies having a PCP at this time.  Per pt chart review: Pt was seen in the ED on 02/05/16 for abscess to her nose. Pt was Rx percocet and bactrim for the relief of her symptoms.    Past Medical History  Diagnosis Date  . Obesity    Past Surgical History  Procedure Laterality Date  . Breast biopsy Left 07/31/2013    Procedure: IRRIGATION AND DEBRIDMENT LEFT BREAST ABSCESS;  Surgeon: Lodema PilotBrian Layton, DO;  Location: [facility];  Service: General;  Laterality: Left;  IRRIGATION AND DEBRIDEMENT LEFT BREAST ABSCESS  . Cesarean section     No family history on file. Social History  Substance Use Topics  . Smoking  status: Current Every Day Smoker -- 1.00 packs/day    Types: Cigarettes  . Smokeless tobacco: None  . Alcohol Use: No   OB History    No data available     Review of Systems  Constitutional: Negative for fever and chills.  HENT: Positive for sore throat. Negative for trouble swallowing.   All other systems reviewed and are negative.   Allergies  Review of patient's allergies indicates no known allergies.  Home Medications   Prior to Admission medications   Medication Sig Start Date End Date Taking? Authorizing Provider  benzonatate (TESSALON) 100 MG capsule Take 1 capsule (100 mg total) by mouth every 8 (eight) hours. 02/02/16   Shawn C Joy, PA-C  clindamycin (CLEOCIN) 150 MG capsule Take 1 capsule (150 mg total) by mouth every 6 (six) hours. 03/20/16   Jentry Warnell Patel-Mills, PA-C  doxycycline (VIBRAMYCIN) 100 MG capsule Take 1 capsule (100 mg total) by mouth 2 (two) times daily. Patient not taking: Reported on 02/05/2016 10/24/15   Linwood DibblesJon Knapp, MD  ibuprofen (ADVIL,MOTRIN) 800 MG tablet Take 1 tablet (800 mg total) by mouth 3 (three) times daily. 03/20/16   Mya Suell Patel-Mills, PA-C  methocarbamol (ROBAXIN) 500 MG tablet Take 1 tablet (500 mg total) by mouth 2 (two) times daily. 02/02/16   Shawn C Joy, PA-C  oxyCODONE-acetaminophen (PERCOCET/ROXICET) 5-325 MG tablet Take 1-2 tablets by mouth every 4 (four) hours as needed for severe  pain. 02/05/16   Elpidio Anis, PA-C   BP 126/87 mmHg  Pulse 87  Temp(Src) 98.8 F (37.1 C) (Oral)  Resp 18  SpO2 100%  LMP 12/21/2015 Physical Exam  Constitutional: She is oriented to person, place, and time. She appears well-developed and well-nourished. No distress.  HENT:  Head: Normocephalic and atraumatic.  Swelling and tenderness to upper lip. No obvious sign of erythema. No septal deviation. Right anterior cervical tenderness but no anterior cervical lymphadenopathy.   Oropharynx is clear and moist.  No drooling or trismus.  Uvula midline. No tonsillar  exudates or erythema.   Eyes: Conjunctivae and EOM are normal.  Neck: Normal range of motion. Neck supple.  Cardiovascular: Normal rate.   Pulmonary/Chest: Effort normal. No respiratory distress.  Musculoskeletal: Normal range of motion.  Neurological: She is alert and oriented to person, place, and time.  Skin: Skin is warm and dry.  Psychiatric: She has a normal mood and affect. Her behavior is normal.  Nursing note and vitals reviewed.   ED Course  Procedures (including critical care time) DIAGNOSTIC STUDIES: Oxygen Saturation is 100% on RA, nl by my interpretation.    COORDINATION OF CARE: 6:40PM Discussed treatment plan with pt at bedside which includes rapid strep screen, bactrim Rx and Keflex Rx and pt agreed to plan.  6:39 PM- Pt offered CT scan to which she declined due to claustrophobia, even after being offered medications.    Labs Review Labs Reviewed  RAPID STREP SCREEN (NOT AT [facility])  CULTURE, GROUP A STREP [facility])    Imaging Review No results found. I have personally reviewed and evaluated these lab results as part of my medical decision-making.   EKG Interpretation None      MDM   Final diagnoses:  Facial abscess   Patient has facial abscess. 2nd visit for the same. She is afebrile and vitals are stable. Oropharynx appears normal.  I wanted to order CT of face to look for deep infection under septum of nose due to facial swelling. CT scan and medications to help with claustrophobia were offered to which the pt declined. I discussed importance of CT scan but she again refused. She stated she would only do it if she was completely sedated and unconscious. Pt doesn't have good follow up. I thoroughly discussed return precautions with pt and recheck within two days. I explained that she should come back for fever and increased swelling. I discussed warm compresses. Pt stated that bactrim does not work for her and that the abscess returned once she finished  bactrim. Will treat with clindamycin to cover for MRSA.  Filed Vitals:   03/20/16 1822 03/20/16 1932  BP: 126/87   Pulse: 100 87  Temp: 98.8 F (37.1 C)   Resp: 18     I personally performed the services described in this documentation, which was scribed in my presence. The recorded information has been reviewed and is accurate.    Catha Gosselin, PA-C 03/21/16 1452  Alvira Monday, MD 03/21/16 [phone]

## 2016-03-22 LAB — CULTURE, GROUP A STREP (THRC)

## 2016-03-23 ENCOUNTER — Telehealth: Payer: Self-pay

## 2016-03-23 NOTE — Telephone Encounter (Signed)
Post ED Visit - Positive Culture Follow-up  Culture report reviewed by antimicrobial stewardship pharmacist:  []  Enzo BiNathan Batchelder, Pharm.D. []  Celedonio MiyamotoJeremy Frens, Pharm.D., BCPS []  Garvin FilaMike Maccia, Pharm.D. []  Georgina PillionElizabeth Martin, [address]Ph[address]D., BCPS []  BrooksvilleMinh Pham, [address]Pharm.D., BCPS, AAHIVP []  Estella HuskMichelle Turner, Pharm.D., BCPS, AAHIVP []  Tennis Mustassie Stewart, [address]Pharm[address]D. []  Sherle Poeob Vincent, [address]Ph[address] Tennis Mustassie Stewart PharmD Positive throat culture No treatment needed per Fayrene HelperBowie Tran PA-C  Jaylise Peek, Linnell Fullingose Burnett 03/23/2016, 10:09 AM

## 2016-07-01 ENCOUNTER — Encounter (HOSPITAL_COMMUNITY): Payer: Self-pay | Admitting: Emergency Medicine

## 2016-07-01 ENCOUNTER — Emergency Department (HOSPITAL_COMMUNITY)
Admission: EM | Admit: 2016-07-01 | Discharge: 2016-07-01 | Disposition: A | Discharge status: Home / Self Care | Payer: Medicaid Other | Attending: Emergency Medicine | Admitting: Emergency Medicine

## 2016-07-01 DIAGNOSIS — F17210 Nicotine dependence, cigarettes, uncomplicated: Secondary | ICD-10-CM | POA: Insufficient documentation

## 2016-07-01 DIAGNOSIS — K0889 Other specified disorders of teeth and supporting structures: Secondary | ICD-10-CM | POA: Diagnosis not present

## 2016-07-01 DIAGNOSIS — R6884 Jaw pain: Secondary | ICD-10-CM | POA: Diagnosis present

## 2016-07-01 MED ORDER — PENICILLIN V POTASSIUM 500 MG PO TABS: 500.0000 mg | ORAL_TABLET | Freq: Four times a day (QID) | ORAL | Status: AC

## 2016-07-01 MED ORDER — IBUPROFEN 200 MG PO TABS
600.0000 mg | ORAL_TABLET | Freq: Once | ORAL | Status: AC
  Administered 2016-07-01: 600 mg via ORAL
  Filled 2016-07-01: qty 3

## 2016-07-01 NOTE — ED Notes (Signed)
Patient to ED from home c/o dental abscess R lower side x 3 weeks. Coming in this morning because pain worsened, especially with eating and moving jaw. Denies fevers/chills. A&O x 4.

## 2016-07-01 NOTE — ED Provider Notes (Signed)
CSN: [id number]     Arrival date & time 07/01/16  0433 History   First MD Initiated Contact with Patient 07/01/16 [phone]     Chief Complaint  Patient presents with  . Abscess     (Consider location/radiation/quality/duration/timing/severity/associated sxs/prior Treatment) HPI   Pt is a 30 y/o female who presents to the ED with progressively worsening lower left gum/jaw pain for 3 weeks. Pt states she had a broken tooth for >5 months. Constant achy pain radates into her left jaw, worse with lying down and touch. No associated symptoms. Denies fever, chills, abdominal pain, nausea, vomiting, difficulty swallowing, swelling of her throat or tongue.   Past Medical History  Diagnosis Date  . Obesity    Past Surgical History  Procedure Laterality Date  . Breast biopsy Left 07/31/2013    Procedure: IRRIGATION AND DEBRIDMENT LEFT BREAST ABSCESS;  Surgeon: Lodema PilotBrian Layton, DO;  Location: [facility];  Service: General;  Laterality: Left;  IRRIGATION AND DEBRIDEMENT LEFT BREAST ABSCESS  . Cesarean section     No family history on file. Social History  Substance Use Topics  . Smoking status: Current Every Day Smoker -- 1.00 packs/day    Types: Cigarettes  . Smokeless tobacco: None  . Alcohol Use: No   OB History    No data available     Review of Systems  Constitutional: Negative for fever and chills.  HENT: Negative for trouble swallowing.   Respiratory: Negative for cough.   Gastrointestinal: Negative for nausea, vomiting and abdominal pain.      Allergies  Review of patient's allergies indicates no known allergies.  Home Medications   Prior to Admission medications   Medication Sig Start Date End Date Taking? Authorizing Provider  benzonatate (TESSALON) 100 MG capsule Take 1 capsule (100 mg total) by mouth every 8 (eight) hours. Patient not taking: Reported on 07/01/2016 02/02/16   Shawn C Joy, PA-C  clindamycin (CLEOCIN) 150 MG capsule Take 1 capsule (150 mg total) by mouth every 6  (six) hours. Patient not taking: Reported on 07/01/2016 03/20/16   Catha GosselinHanna Patel-Mills, PA-C  doxycycline (VIBRAMYCIN) 100 MG capsule Take 1 capsule (100 mg total) by mouth 2 (two) times daily. Patient not taking: Reported on 02/05/2016 10/24/15   Linwood DibblesJon Knapp, MD  ibuprofen (ADVIL,MOTRIN) 800 MG tablet Take 1 tablet (800 mg total) by mouth 3 (three) times daily. Patient not taking: Reported on 07/01/2016 03/20/16   Catha GosselinHanna Patel-Mills, PA-C  methocarbamol (ROBAXIN) 500 MG tablet Take 1 tablet (500 mg total) by mouth 2 (two) times daily. Patient not taking: Reported on 07/01/2016 02/02/16   Anselm PancoastShawn C Joy, PA-C  oxyCODONE-acetaminophen (PERCOCET/ROXICET) 5-325 MG tablet Take 1-2 tablets by mouth every 4 (four) hours as needed for severe pain. Patient not taking: Reported on 07/01/2016 02/05/16   Elpidio AnisShari Upstill, PA-C  penicillin v potassium (VEETID) 500 MG tablet Take 1 tablet (500 mg total) by mouth 4 (four) times daily. 07/01/16 07/08/16  Eldora Napp L Mireille Lacombe, PA   BP 123/78 mmHg  Pulse 66  Temp(Src) 98.4 F (36.9 C) (Oral)  Resp 18  Ht 5\' 4"  (1.626 m)  Wt 95.255 kg  BMI 36.03 kg/m2  SpO2 97% Physical Exam  Constitutional: She appears well-developed and well-nourished. No distress.  HENT:  Head: Normocephalic and atraumatic.  Mouth/Throat: Uvula is midline, oropharynx is clear and moist and mucous membranes are normal. No trismus in the jaw. Abnormal dentition. Dental caries present. No dental abscesses or uvula swelling.    Broken second premolar without gingival  swelling. mild erythema, no abscess, poor dentition, no trismus, no sublingual tenderness or swelling  Eyes: Conjunctivae are normal.  Cardiovascular: Normal rate, regular rhythm and normal heart sounds.  Exam reveals no gallop and no friction rub.   No murmur heard. Pulmonary/Chest: Effort normal. No respiratory distress. She has no wheezes. She has no rales.  Musculoskeletal: Normal range of motion.  Lymphadenopathy:    She has no cervical  adenopathy.  Neurological: She is alert. Coordination normal.  Skin: Skin is warm and dry. She is not diaphoretic.  Psychiatric: She has a normal mood and affect. Her behavior is normal.  Nursing note and vitals reviewed.   ED Course  Procedures (including critical care time) Labs Review Labs Reviewed - No data to display  Imaging Review No results found. I have personally reviewed and evaluated these images and lab results as part of my medical decision-making.   EKG Interpretation None      MDM   Final diagnoses:  Pain, dental   Patient with toothache.  No gross abscess.  Exam unconcerning for Ludwig's angina or spread of infection.  Will treat with penicillin and pain medicine.  Urged patient to follow-up with dentist. Patient given numerous resources in her discharge report for dentist. Instructed patient to obtain a primary care provider and follow up with them regarding her slightly elevated blood pressure.   Discussed strict return precautions. Patient was understanding to the discharge instructions.     Jerre Simon, PA 07/01/16 1558  Tomasita Crumble, MD 07/01/16 [phone]

## 2016-07-01 NOTE — ED Notes (Addendum)
Patient states that ibuprofen isn't going to do anything for her and requests "something stronger." Patient also requesting crackers - RN provided graham crackers and saltines.  PA-C aware.

## 2016-07-01 NOTE — Discharge Instructions (Signed)
You were seen for dental pain. Follow up with a dentist tomorrow to have your tooth evaluated. Take the antibiotic as prescribed and be sure to complete the full course. Use OTC tylenol or ibuprofen for pain. Call the number in this discharge paperwork to obtain a primary care provider to follow up with them regarding your elevated blood pressure.   Return to the emergency department if your dental pain worsens, you experience swelling of your gums, tongue, or throat, have difficultly swallowing, fever, or chills.   [employer] [facility] [employer] 211 is a great source of information about community services available.  Access by dialing 2-1-1 from anywhere in [place], or by website -  PooledIncome.plwww.nc211.org.   Other Local Resources (Updated 12/2015)  Dental  Care   Services    Phone Number and Address  Cost  [facility] For children 80 - 31 years of age:   Cleaning  Tooth brushing/flossing instruction  Sealants, fillings, crowns  Extractions  Emergency treatment  [phone] [address] [place], [place] [id number] Charges based on family income.  Medicaid and some insurance plans accepted.     [facility]  Cleaning  Sealants, fillings, crowns  Extractions  Emergency treatment [phone] [address] [place], [place]  Pregnant women 31 years of age or older with a Medicaid card  [facility] [place]  Cleaning  Sealants, fillings, crowns  Extractions  Emergency treatment [phone] [address] [place], [place] Pregnant women 31 years of age or older with a Medicaid card  [facility] For children 730 - 521 years of age:   Cleaning  Tooth brushing/flossing instruction  Sealants, fillings, crowns  Extractions  Emergency treatment Limited orthodontic services for  patients with Medicaid [phone] [address] [place], [place] [id number] Medicaid and [facility] Health Choice cover for children up to age 31 and pregnant women.  Parents of children up to age 31 without Medicaid pay a reduced fee at time of service.  [facility] [place] For children 330 - 31 years of age:   Cleaning  Tooth brushing/flossing instruction  Sealants, fillings, crowns  Extractions  Emergency treatment Limited orthodontic services for patients with Medicaid [phone] [address] [place], [place].  Medicaid and [place] Health Choice cover for children up to age 31 and pregnant women.  Parents of children up to age 31 without Medicaid pay a reduced fee.  [facility]  Cleaning  Sealants, fillings, crowns  Extractions  Hours: Tuesdays and Thursdays, 4:15 - 8 pm [phone] [address] [place], [place] [id number] Services free of charge to [facility] residents ages 18-64 who do not have health insurance, Medicare, [place], or [place] benefits and fall within federal poverty guidelines  [facility]    Provides dental care in addition to primary medical care, nutritional counseling, and pharmacy:  Nurse, mental healthCleaning  Sealants, fillings, crowns  Extractions                  [phone] [facility], [address] [place], [place]  [phone] [facility], [address] [place], [place]  [phone] [facility] [place], [place]  [phone] [facility], [address] [place], [place]  [phone] [facility] [phone] [address] [place], [place] Accepts [place], [employer], most insurance.  Also provides services available to all with fees adjusted based on ability to pay.    [facility]   Cleaning  Tooth brushing/flossing instruction  Sealants, fillings, crowns  Extractions  Emergency treatment Hours: Tuesdays, Thursdays, and Fridays from 8 am to 5 pm by appointment only. [phone] [address] [place], [place] [address] [facility] residents with Medicaid (depending on eligibility) and children with [facility] Health Choice - call for more information.  [facility]  Extractions only  Hours: 2nd and 4th Thursday of each month from 6:30 am - 9 am.   [phone] [address] [place], [place] [address] Ages 69 and older only.  Patients are seen on a first come, first served basis.  [employer]  [employer]  Extractions  Orthodontics  Endodontics  Implants/Crowns/Bridges  Complete and partial dentures [phone] [place], [place] Patients must complete an application for services.  There is often a waiting list.

## 2017-02-08 ENCOUNTER — Encounter (HOSPITAL_COMMUNITY): Payer: Self-pay

## 2017-02-08 ENCOUNTER — Ambulatory Visit (HOSPITAL_COMMUNITY)
Admission: EM | Admit: 2017-02-08 | Discharge: 2017-02-08 | Disposition: A | Discharge status: Home / Self Care | Payer: Medicaid Other | Attending: Family Medicine | Admitting: Family Medicine

## 2017-02-08 DIAGNOSIS — M62830 Muscle spasm of back: Secondary | ICD-10-CM | POA: Diagnosis not present

## 2017-02-08 MED ORDER — BACLOFEN 10 MG PO TABS: 10.0000 mg | ORAL_TABLET | Freq: Three times a day (TID) | ORAL | 1 refills | Status: DC

## 2017-02-08 NOTE — ED Provider Notes (Signed)
[facility]    CSN: [id number] Arrival date & time: 02/08/17  [id number]     History   Chief Complaint Chief Complaint  Patient presents with  . Back Pain    HPI [name] is a 31 y.o. female.   The history is provided by the patient.  Back Pain  Location:  Thoracic spine Quality:  Stiffness Radiates to:  Does not radiate Pain severity:  Mild Onset quality:  Sudden Duration:  2 days Progression:  Unchanged Chronicity:  New Context: twisting   Relieved by:  None tried Worsened by:  Nothing Ineffective treatments:  None tried Associated symptoms: no fever   Associated symptoms comment:  Left subscapular local tenderness to palp and with stretching   Past Medical History:  Diagnosis Date  . Obesity     There are no active problems to display for this patient.   Past Surgical History:  Procedure Laterality Date  . BREAST BIOPSY Left 07/31/2013   Procedure: IRRIGATION AND DEBRIDMENT LEFT BREAST ABSCESS;  Surgeon: Lodema PilotBrian Layton, DO;  Location: [facility];  Service: General;  Laterality: Left;  IRRIGATION AND DEBRIDEMENT LEFT BREAST ABSCESS  . CESAREAN SECTION      OB History    No data available       Home Medications    Prior to Admission medications   Medication Sig Start Date End Date Taking? Authorizing Provider  baclofen (LIORESAL) 10 MG tablet Take 1 tablet (10 mg total) by mouth 3 (three) times daily. As muscle relaxer 02/08/17   Linna HoffJames D Bilaal Leib, MD  benzonatate (TESSALON) 100 MG capsule Take 1 capsule (100 mg total) by mouth every 8 (eight) hours. Patient not taking: Reported on 07/01/2016 02/02/16   Shawn C Joy, PA-C  clindamycin (CLEOCIN) 150 MG capsule Take 1 capsule (150 mg total) by mouth every 6 (six) hours. Patient not taking: Reported on 07/01/2016 03/20/16   Catha GosselinHanna Patel-Mills, PA-C  doxycycline (VIBRAMYCIN) 100 MG capsule Take 1 capsule (100 mg total) by mouth 2 (two) times daily. Patient not taking: Reported on 02/05/2016 10/24/15   Linwood DibblesJon  Knapp, MD  ibuprofen (ADVIL,MOTRIN) 800 MG tablet Take 1 tablet (800 mg total) by mouth 3 (three) times daily. Patient not taking: Reported on 07/01/2016 03/20/16   Catha GosselinHanna Patel-Mills, PA-C  methocarbamol (ROBAXIN) 500 MG tablet Take 1 tablet (500 mg total) by mouth 2 (two) times daily. Patient not taking: Reported on 07/01/2016 02/02/16   Anselm PancoastShawn C Joy, PA-C  oxyCODONE-acetaminophen (PERCOCET/ROXICET) 5-325 MG tablet Take 1-2 tablets by mouth every 4 (four) hours as needed for severe pain. Patient not taking: Reported on 07/01/2016 02/05/16   Elpidio AnisShari Upstill, PA-C    Family History No family history on file.  Social History Social History  Substance Use Topics  . Smoking status: Current Every Day Smoker    Packs/day: 1.00    Types: Cigarettes  . Smokeless tobacco: Never Used  . Alcohol use No     Allergies   Patient has no known allergies.   Review of Systems Review of Systems  Constitutional: Negative.  Negative for fever.  Respiratory: Negative.  Negative for shortness of breath.   Musculoskeletal: Positive for back pain.  All other systems reviewed and are negative.    Physical Exam Triage Vital Signs ED Triage Vitals [02/08/17 2003]  Enc Vitals Group     BP 119/66     Pulse Rate 87     Resp 20     Temp 98.3 F (36.8 C)  Temp Source Oral     SpO2 100 %     Weight      Height      Head Circumference      Peak Flow      Pain Score 10     Pain Loc      Pain Edu?      Excl. in GC?    No data found.   Updated Vital Signs BP 119/66 (BP Location: Right Arm)   Pulse 87   Temp 98.3 F (36.8 C) (Oral)   Resp 20   SpO2 100%   Visual Acuity Right Eye Distance:   Left Eye Distance:   Bilateral Distance:    Right Eye Near:   Left Eye Near:    Bilateral Near:     Physical Exam  Constitutional: She is oriented to person, place, and time. She appears well-developed and well-nourished. No distress.  Neck: Normal range of motion. Neck supple.  Cardiovascular:  Normal rate, regular rhythm, normal heart sounds and intact distal pulses.   Pulmonary/Chest: Effort normal and breath sounds normal.  Musculoskeletal: She exhibits tenderness.       Arms: Lymphadenopathy:    She has no cervical adenopathy.  Neurological: She is alert and oriented to person, place, and time.  Skin: Skin is warm and dry.  Nursing note and vitals reviewed.    UC Treatments / Results  Labs (all labs ordered are listed, but only abnormal results are displayed) Labs Reviewed - No data to display  EKG  EKG Interpretation None       Radiology No results found.  Procedures Procedures (including critical care time)  Medications Ordered in UC Medications - No data to display   Initial Impression / Assessment and Plan / UC Course  I have reviewed the triage vital signs and the nursing notes.  Pertinent labs & imaging results that were available during my care of the patient were reviewed by me and considered in my medical decision making (see chart for details).       Final Clinical Impressions(s) / UC Diagnoses   Final diagnoses:  Muscle spasm of back    New Prescriptions Discharge Medication List as of 02/08/2017  8:40 PM    START taking these medications   Details  baclofen (LIORESAL) 10 MG tablet Take 1 tablet (10 mg total) by mouth 3 (three) times daily. As muscle relaxer, Starting Fri 02/08/2017, Print         Linna Hoff, MD 02/08/17 2046

## 2017-02-08 NOTE — ED Triage Notes (Signed)
Pt having back pain for 2 days. Said it's a nerve. Not injury related. Has been taking motrin which isn't helping.

## 2017-02-08 NOTE — Discharge Instructions (Signed)
Heat, stretch and medicine as needed, see your doctor as needed. °

## 2017-03-06 ENCOUNTER — Encounter: Payer: Self-pay | Admitting: Family Medicine

## 2017-03-06 ENCOUNTER — Other Ambulatory Visit (HOSPITAL_COMMUNITY)
Admission: RE | Admit: 2017-03-06 | Discharge: 2017-03-06 | Disposition: A | Discharge status: Home / Self Care | Payer: Medicaid Other | Source: Ambulatory Visit | Attending: Family Medicine | Admitting: Family Medicine

## 2017-03-06 ENCOUNTER — Ambulatory Visit (INDEPENDENT_AMBULATORY_CARE_PROVIDER_SITE_OTHER): Payer: Medicaid Other | Admitting: Family Medicine

## 2017-03-06 VITALS — BP 130/79 | Wt 245.0 lb

## 2017-03-06 DIAGNOSIS — Z30011 Encounter for initial prescription of contraceptive pills: Secondary | ICD-10-CM | POA: Diagnosis not present

## 2017-03-06 DIAGNOSIS — Z720 Tobacco use: Secondary | ICD-10-CM | POA: Diagnosis not present

## 2017-03-06 DIAGNOSIS — Z1151 Encounter for screening for human papillomavirus (HPV): Secondary | ICD-10-CM

## 2017-03-06 DIAGNOSIS — Z124 Encounter for screening for malignant neoplasm of cervix: Secondary | ICD-10-CM | POA: Diagnosis not present

## 2017-03-06 DIAGNOSIS — Z30432 Encounter for removal of intrauterine contraceptive device: Secondary | ICD-10-CM | POA: Diagnosis present

## 2017-03-06 DIAGNOSIS — A5901 Trichomonal vulvovaginitis: Secondary | ICD-10-CM | POA: Diagnosis not present

## 2017-03-06 MED ORDER — NORGESTIMATE-ETH ESTRADIOL 0.25-35 MG-MCG PO TABS: 1.0000 | ORAL_TABLET | Freq: Every day | ORAL | 4 refills | Status: DC

## 2017-03-06 NOTE — Assessment & Plan Note (Signed)
Smoking cessation/counseling done today--cut back, set quit date, impact on weight, knowing triggers, having safe alternatives and written information given.

## 2017-03-06 NOTE — Patient Instructions (Addendum)
Contraception Choices Contraception, also called birth control, means things to use or ways to try not to get pregnant. Hormonal birth control  This kind of birth control uses hormones. Here are some types of hormonal birth control:  A tube that is put under skin of the arm (implant). The tube can stay in for as long as 3 years.  Shots to get every 3 months (injections).  Pills to take every day (birth control pills).  A patch to change 1 time each week for 3 weeks (birth control patch). After that, the patch is taken off for 1 week.  A ring to put in the vagina. The ring is left in for 3 weeks. Then it is taken out of the vagina for 1 week. Then a new ring is put in.  Pills to take after unprotected sex (emergency birth control pills). Barrier birth control  Here are some types of barrier birth control:  A thin covering that is put on the penis before sex (female condom). The covering is thrown away after sex.  A soft, loose covering that is put in the vagina before sex (female condom). The covering is thrown away after sex.  A rubber bowl that sits over the cervix (diaphragm). The bowl must be made for you. The bowl is put into the vagina before sex. The bowl is left in for 6-8 hours after sex. It is taken out within 24 hours.  A small, soft cup that fits over the cervix (cervical cap). The cup must be made for you. The cup can be left in for 6-8 hours after sex. It is taken out within 48 hours.  A sponge that is put into the vagina before sex. It must be left in for at least 6 hours after sex. It must be taken out within 30 hours. Then it is thrown away.  A chemical that kills or stops sperm from getting into the uterus (spermicide). It may be a pill, cream, jelly, or foam to put in the vagina. The chemical should be used at least 10-15 minutes before sex. IUD (intrauterine) birth control An IUD is a small, T-shaped piece of plastic. It is put inside the uterus. There are two  kinds:  Hormone IUD. This kind can stay in for 3-5 years.  Copper IUD. This kind can stay in for 10 years. Permanent birth control Here are some types of permanent birth control:  Surgery to block the fallopian tubes.  Having an insert put into each fallopian tube.  Surgery to tie off the tubes that carry sperm (vasectomy). Natural planning birth control Here are some types of natural planning birth control:  Not having sex on the days the woman could get pregnant.  Using a calendar:  To keep track of the length of each period.  To find out what days pregnancy can happen.  To plan to not have sex on days when pregnancy can happen.  Watching for symptoms of ovulation and not having sex during ovulation. One way the woman can check for ovulation is to check her temperature.  Waiting to have sex until after ovulation. Summary  Contraception, also called birth control, means things to use or ways to try not to get pregnant.  Hormonal methods of birth control include implants, injections, pills, patches, vaginal rings, and emergency birth control pills.  Barrier methods of birth control can include female condoms, female condoms, diaphragms, cervical caps, sponges, and spermicides.  There are two types of IUD (intrauterine device)  birth control. An IUD can be put in a woman's uterus to prevent pregnancy for 3-5 years.  Permanent sterilization can be done through a procedure for males, females, or both.  Natural planning methods involve not having sex on the days when the woman could get pregnant. This information is not intended to replace advice given to you by your health care provider. Make sure you discuss any questions you have with your health care provider. Document Released: 09/30/2009 Document Revised: 12/13/2016 Document Reviewed: 12/13/2016 Elsevier Interactive Patient Education  2017 [employer].  Steps to Quit Smoking Smoking tobacco can be harmful to your  health and can affect almost every organ in your body. Smoking puts you, and those around you, at risk for developing many serious chronic diseases. Quitting smoking is difficult, but it is one of the best things that you can do for your health. It is never too late to quit. What are the benefits of quitting smoking? When you quit smoking, you lower your risk of developing serious diseases and conditions, such as:  Lung cancer or lung disease, such as COPD.  Heart disease.  Stroke.  Heart attack.  Infertility.  Osteoporosis and bone fractures. Additionally, symptoms such as coughing, wheezing, and shortness of breath may get better when you quit. You may also find that you get sick less often because your body is stronger at fighting off colds and infections. If you are pregnant, quitting smoking can help to reduce your chances of having a baby of low birth weight. How do I get ready to quit? When you decide to quit smoking, create a plan to make sure that you are successful. Before you quit:  Pick a date to quit. Set a date within the next two weeks to give you time to prepare.  Write down the reasons why you are quitting. Keep this list in places where you will see it often, such as on your bathroom mirror or in your car or wallet.  Identify the people, places, things, and activities that make you want to smoke (triggers) and avoid them. Make sure to take these actions:  Throw away all cigarettes at home, at work, and in your car.  Throw away smoking accessories, such as Set designer.  Clean your car and make sure to empty the ashtray.  Clean your home, including curtains and carpets.  Tell your family, friends, and coworkers that you are quitting. Support from your loved ones can make quitting easier.  Talk with your health care provider about your options for quitting smoking.  Find out what treatment options are covered by your health insurance. What strategies can  I use to quit smoking? Talk with your healthcare provider about different strategies to quit smoking. Some strategies include:  Quitting smoking altogether instead of gradually lessening how much you smoke over a period of time. Research shows that quitting "cold [place]" is more successful than gradually quitting.  Attending in-person counseling to help you build problem-solving skills. You are more likely to have success in quitting if you attend several counseling sessions. Even short sessions of 10 minutes can be effective.  Finding resources and support systems that can help you to quit smoking and remain smoke-free after you quit. These resources are most helpful when you use them often. They can include:  Online chats with a Veterinary surgeon.  Telephone quitlines.  Printed Materials engineer.  Support groups or group counseling.  Text messaging programs.  Mobile phone applications.  Taking medicines to  help you quit smoking. (If you are pregnant or breastfeeding, talk with your health care provider first.) Some medicines contain nicotine and some do not. Both types of medicines help with cravings, but the medicines that include nicotine help to relieve withdrawal symptoms. Your health care provider may recommend:  Nicotine patches, gum, or lozenges.  Nicotine inhalers or sprays.  Non-nicotine medicine that is taken by mouth. Talk with your health care provider about combining strategies, such as taking medicines while you are also receiving in-person counseling. Using these two strategies together makes you more likely to succeed in quitting than if you used either strategy on its own. If you are pregnant or breastfeeding, talk with your health care provider about finding counseling or other support strategies to quit smoking. Do not take medicine to help you quit smoking unless told to do so by your health care provider. What things can I do to make it easier to quit? Quitting smoking  might feel overwhelming at first, but there is a lot that you can do to make it easier. Take these important actions:  Reach out to your family and friends and ask that they support and encourage you during this time. Call telephone quitlines, reach out to support groups, or work with a counselor for support.  Ask people who smoke to avoid smoking around you.  Avoid places that trigger you to smoke, such as bars, parties, or smoke-break areas at work.  Spend time around people who do not smoke.  Lessen stress in your life, because stress can be a smoking trigger for some people. To lessen stress, try:  Exercising regularly.  Deep-breathing exercises.  Yoga.  Meditating.  Performing a body scan. This involves closing your eyes, scanning your body from head to toe, and noticing which parts of your body are particularly tense. Purposefully relax the muscles in those areas.  Download or purchase mobile phone or tablet apps (applications) that can help you stick to your quit plan by providing reminders, tips, and encouragement. There are many free apps, such as QuitGuide from the [employer] developer(Centers for Disease Control and Prevention). You can find other support for quitting smoking (smoking cessation) through smokefree.gov and other websites. How will I feel when I quit smoking? Within the first 24 hours of quitting smoking, you may start to feel some withdrawal symptoms. These symptoms are usually most noticeable 2-3 days after quitting, but they usually do not last beyond 2-3 weeks. Changes or symptoms that you might experience include:  Mood swings.  Restlessness, anxiety, or irritation.  Difficulty concentrating.  Dizziness.  Strong cravings for sugary foods in addition to nicotine.  Mild weight gain.  Constipation.  Nausea.  Coughing or a sore throat.  Changes in how your medicines work in your body.  A depressed mood.  Difficulty sleeping (insomnia). After the first 2-3  weeks of quitting, you may start to notice more positive results, such as:  Improved sense of smell and taste.  Decreased coughing and sore throat.  Slower heart rate.  Lower blood pressure.  Clearer skin.  The ability to breathe more easily.  Fewer sick days. Quitting smoking is very challenging for most people. Do not get discouraged if you are not successful the first time. Some people need to make many attempts to quit before they achieve long-term success. Do your best to stick to your quit plan, and talk with your health care provider if you have any questions or concerns. This information is not intended to  replace advice given to you by your health care provider. Make sure you discuss any questions you have with your health care provider. Document Released: 11/27/2001 Document Revised: 07/31/2016 Document Reviewed: 04/19/2015 Elsevier Interactive Patient Education  2017 [employer].

## 2017-03-06 NOTE — Progress Notes (Signed)
   Subjective:    Patient ID: [name] is a 31 y.o. female presenting with No chief complaint on file.  on 03/06/2017  HPI: Has had her IUD x 6 years. Notes she has some acne related to her IUD. Does not want another IUD. Just wants OC's. Is a smoker. Interested in quitting. Last pap in 2014.  Review of Systems  Constitutional: Negative for chills and fever.  Respiratory: Negative for shortness of breath.   Cardiovascular: Negative for chest pain.  Gastrointestinal: Negative for abdominal pain, nausea and vomiting.  Genitourinary: Negative for dysuria.  Skin: Negative for rash.      Objective:     Blood pressure 130/79, weight 245 lb (111.1 kg).  Physical Exam  Constitutional: She is oriented to person, place, and time. She appears well-developed and well-nourished. No distress.  HENT:  Head: Normocephalic and atraumatic.  Eyes: No scleral icterus.  Neck: Neck supple.  Cardiovascular: Normal rate.   Pulmonary/Chest: Effort normal.  Abdominal: Soft.  Genitourinary:  Genitourinary Comments: BUS normal, vagina is pink and rugated, cervix is nulliparous without lesion, mass limited by body habitus..   Neurological: She is alert and oriented to person, place, and time.  Skin: Skin is warm and dry.  Psychiatric: She has a normal mood and affect.   Procedure: Speculum placed inside vagina.  Cervix visualized.  Strings grasped with ring forceps.  IUD removed intact.     Assessment & Plan:   Problem List Items Addressed This Visit      Unprioritized   Tobacco use    Smoking cessation/counseling done today--cut back, set quit date, impact on weight, knowing triggers, having safe alternatives and written information given.       Other Visit Diagnoses    Encounter for initial prescription of contraceptive pills    -  Primary   OK to start follwoing IUD removal-use condoms if does not start right away--cannot take after age 31 and smoking.   Relevant Medications   norgestimate-ethinyl estradiol (ORTHO-CYCLEN,SPRINTEC,PREVIFEM) 0.25-35 MG-MCG tablet   Screening for cervical cancer       Needs pap--none x 4 years.   Relevant Orders   Cytology - PAP   Encounter for IUD removal          Total face-to-face time with patient: 30 minutes. Over 50% of encounter was spent on counseling and coordination of care. Return in about 1 year (around 03/06/2018).   Reva Boresanya S Elesha Thedford 03/06/2017 10:32 AM

## 2017-03-08 LAB — CYTOLOGY - PAP
DIAGNOSIS: NEGATIVE
HPV: NOT DETECTED

## 2017-03-11 ENCOUNTER — Telehealth: Payer: Self-pay | Admitting: *Deleted

## 2017-03-11 MED ORDER — METRONIDAZOLE 500 MG PO TABS: 500.0000 mg | ORAL_TABLET | Freq: Two times a day (BID) | ORAL | 0 refills | Status: DC

## 2017-03-11 NOTE — Telephone Encounter (Signed)
[name] returned our call and I notified her of her results and rx sent to her pharmacy. Also advised her to have her partner get treatment and avoid intimate contact until both treated and  Waited 2 weeks. She voices understanding.

## 2017-03-11 NOTE — Telephone Encounter (Signed)
Called pt and spoke with her father. Pt was not available at the time. He stated that he will give [name] a message to call our office. Per Dr. Shawnie PonsPratt, pt needs to be informed of normal Pap result and +Trich infection. Rx has been sent to her pharmacy. Pt needs to be informed that her partner requires treatment also.

## 2017-05-19 ENCOUNTER — Encounter (HOSPITAL_COMMUNITY): Payer: Self-pay

## 2017-05-19 ENCOUNTER — Emergency Department (HOSPITAL_COMMUNITY)
Admission: EM | Admit: 2017-05-19 | Discharge: 2017-05-19 | Disposition: A | Discharge status: Home / Self Care | Payer: Medicaid Other | Attending: Emergency Medicine | Admitting: Emergency Medicine

## 2017-05-19 DIAGNOSIS — F17210 Nicotine dependence, cigarettes, uncomplicated: Secondary | ICD-10-CM | POA: Diagnosis not present

## 2017-05-19 DIAGNOSIS — K0889 Other specified disorders of teeth and supporting structures: Secondary | ICD-10-CM | POA: Diagnosis present

## 2017-05-19 DIAGNOSIS — Z79899 Other long term (current) drug therapy: Secondary | ICD-10-CM | POA: Insufficient documentation

## 2017-05-19 DIAGNOSIS — K047 Periapical abscess without sinus: Secondary | ICD-10-CM | POA: Insufficient documentation

## 2017-05-19 MED ORDER — PENICILLIN V POTASSIUM 500 MG PO TABS: 500.0000 mg | ORAL_TABLET | Freq: Four times a day (QID) | ORAL | 0 refills | Status: AC

## 2017-05-19 MED ORDER — HYDROCODONE-ACETAMINOPHEN 5-325 MG PO TABS
1.0000 | ORAL_TABLET | Freq: Once | ORAL | Status: AC
  Administered 2017-05-19: 1 via ORAL
  Filled 2017-05-19: qty 1

## 2017-05-19 MED ORDER — NAPROXEN 500 MG PO TABS: 500.0000 mg | ORAL_TABLET | Freq: Two times a day (BID) | ORAL | 0 refills | Status: DC

## 2017-05-19 NOTE — ED Notes (Signed)
PT in the restroom when called.Marland Kitchen..Marland Kitchen

## 2017-05-19 NOTE — Discharge Instructions (Signed)
Please read instructions below. °Take the antibiotic, Penicillin V, 4 times per day until they are gone. °You can take Naproxen up to 2 times per day with meals, as needed for pain. °Schedule an appointment with a dentist, using the dental resource guide attached. °Return to the ER for difficulty swallowing or breathing, fever, or new or worsening symptoms. ° °

## 2017-05-19 NOTE — ED Triage Notes (Signed)
Per Pt, Pt has been seen for right sided tooth pain that has been continuous. Pt has some missing teeth noted to her right side with some poor hygiene and fillings noted. Denies fevers.

## 2017-05-19 NOTE — ED Provider Notes (Signed)
[facility]-EMERGENCY DEPT Provider Note   CSN: [id number] Arrival date & time: 05/19/17  7829  By signing my name below, I, Rosana Fret, attest that this documentation has been prepared under the direction and in the presence of non-physician practitioner, Russo, [place], PA-C. Electronically Signed: Rosana Fret, ED Scribe. 05/19/17. 10:19 AM.  History   Chief Complaint Chief Complaint  Patient presents with  . Dental Pain   The history is provided by the patient. No language interpreter was used.   HPI Comments: [name] is a 31 y.o. female who presents to the Emergency Department complaining of gradual worsening, constant right-sided dental pain onset 1 week ago. She describes her pain as throbbing and states her pain is worst in her gums. Pt reports associated pain with swallowing. Pt denies trouble breathing, difficulty swallowing fever, drainage, fever, nausea, vomiting or any other complaints at this time. NKDA  Past Medical History:  Diagnosis Date  . Obesity     Patient Active Problem List   Diagnosis Date Noted  . Obesity, Class III, BMI 40-49.9 (morbid obesity) (HCC) 03/06/2017  . Tobacco use 03/06/2017    Past Surgical History:  Procedure Laterality Date  . BREAST BIOPSY Left 07/31/2013   Procedure: IRRIGATION AND DEBRIDMENT LEFT BREAST ABSCESS;  Surgeon: Lodema Pilot, DO;  Location: [facility];  Service: General;  Laterality: Left;  IRRIGATION AND DEBRIDEMENT LEFT BREAST ABSCESS  . CESAREAN SECTION      OB History    No data available       Home Medications    Prior to Admission medications   Medication Sig Start Date End Date Taking? Authorizing Provider  amitriptyline (ELAVIL) 25 MG tablet TK 1 T PO QHS PRN 01/28/17   [provider]  baclofen (LIORESAL) 10 MG tablet Take 1 tablet (10 mg total) by mouth 3 (three) times daily. As muscle relaxer Patient not taking: Reported on 03/06/2017 02/08/17   Linna Hoff, MD  FLUoxetine (PROZAC) 10 MG  capsule TAKE 1 CAPSULE(S) BY MOUTH DAILY FOR ANXIETY/DEPRESSION 02/14/17   [provider]  gabapentin (NEURONTIN) 100 MG capsule TAKE 1 TABLET(S) BY MOUTH TWICE A DAY FOR ANXIETY 02/14/17   [provider]  lamoTRIgine (LAMICTAL) 25 MG tablet TAKE 1 TABLET BY MOUTH AT BEDTIME FOR 2WKS THEN INCREASE TO 2 TABS BY MOUTH AT BEDTIME FOR MOOD 02/14/17   [provider]  metroNIDAZOLE (FLAGYL) 500 MG tablet Take 1 tablet (500 mg total) by mouth 2 (two) times daily. 03/11/17   Reva Bores, MD  naproxen (NAPROSYN) 500 MG tablet Take 1 tablet (500 mg total) by mouth 2 (two) times daily. 05/19/17   Russo, [place] N, PA-C  norgestimate-ethinyl estradiol (ORTHO-CYCLEN,SPRINTEC,PREVIFEM) 0.25-35 MG-MCG tablet Take 1 tablet by mouth daily. 03/06/17   Reva Bores, MD  penicillin v potassium (VEETID) 500 MG tablet Take 1 tablet (500 mg total) by mouth 4 (four) times daily. 05/19/17 05/26/17  Russo, [place] N, PA-C  Vitamin D, Ergocalciferol, (DRISDOL) 50000 units CAPS capsule take 1 capsule by mouth 1 weekly 02/06/17   [provider]    Family History Family History  Problem Relation Age of Onset  . Diabetes Father     Social History Social History  Substance Use Topics  . Smoking status: Current Every Day Smoker    Packs/day: 1.00    Types: Cigarettes  . Smokeless tobacco: Never Used  . Alcohol use No     Allergies   Patient has no known allergies.  Review of Systems Review of Systems  Constitutional: Negative for fever.  HENT: Positive for dental problem. Negative for facial swelling and trouble swallowing.   Respiratory:       No difficulty breathing  Gastrointestinal: Negative for nausea and vomiting.     Physical Exam Updated Vital Signs BP 130/85 (BP Location: Left Arm)   Pulse 69   Temp 97.9 F (36.6 C) (Oral)   Resp 14   Ht 5\' 4"  (1.626 m)   Wt 229 lb 6 oz (104 kg)   SpO2 100%   BMI 39.37 kg/m   Physical Exam  Constitutional: She appears  well-developed and well-nourished.  HENT:  Head: Normocephalic and atraumatic.  Nose: Nose normal.  Mouth/Throat: Uvula is midline and oropharynx is clear and moist.    No fluctuance in gingiva, no loose teeth. Multiple dental caries. Mild erythema in the gingiva. Missing several teeth and with infection at root  Eyes: Conjunctivae are normal.  Neck: Normal range of motion. Neck supple. No tracheal deviation present.  Cardiovascular: Normal rate.   Pulmonary/Chest: Effort normal. No stridor.  Lymphadenopathy:    She has no cervical adenopathy.  Psychiatric: She has a normal mood and affect. Her behavior is normal.  Nursing note and vitals reviewed.    ED Treatments / Results  DIAGNOSTIC STUDIES: Oxygen Saturation is 100% on RA, normal by my interpretation.   COORDINATION OF CARE: 10:12 AM-Discussed next steps with pt including treatment with Penicillin and Naproxen for pain. Pt verbalized understanding and is agreeable with the plan.   Labs (all labs ordered are listed, but only abnormal results are displayed) Labs Reviewed - No data to display  EKG  EKG Interpretation None       Radiology No results found.  Procedures Procedures (including critical care time)  Medications Ordered in ED Medications  HYDROcodone-acetaminophen (NORCO/VICODIN) 5-325 MG per tablet 1 tablet (1 tablet Oral Given 05/19/17 1028)     Initial Impression / Assessment and Plan / ED Course  I have reviewed the triage vital signs and the nursing notes.  Pertinent labs & imaging results that were available during my care of the patient were reviewed by me and considered in my medical decision making (see chart for details).      Patient with dental infection.  No abscess requiring immediate incision and drainage.  Exam not concerning for Ludwig's angina or pharyngeal abscess.  Patient is afebrile, tolerating secretions. Will treat with Penicillin. Pt instructed to follow-up with dentist.   Discussed return precautions. Pt safe for discharge.  Discussed results, findings, treatment and follow up. Patient advised of return precautions. Patient verbalized understanding and agreed with plan.   Final Clinical Impressions(s) / ED Diagnoses   Final diagnoses:  Dental infection    New Prescriptions Discharge Medication List as of 05/19/2017 10:18 AM    START taking these medications   Details  naproxen (NAPROSYN) 500 MG tablet Take 1 tablet (500 mg total) by mouth 2 (two) times daily., Starting Sun 05/19/2017, Print    penicillin v potassium (VEETID) 500 MG tablet Take 1 tablet (500 mg total) by mouth 4 (four) times daily., Starting Sun 05/19/2017, Until Sun 05/26/2017, Print       I personally performed the services described in this documentation, which was scribed in my presence. The recorded information has been reviewed and is accurate.     Russo, [place] N, PA-C 05/19/17 1739    Bethann BerkshireZammit, Joseph, MD 05/20/17 [phone]

## 2017-12-18 ENCOUNTER — Other Ambulatory Visit: Payer: Self-pay

## 2017-12-18 ENCOUNTER — Emergency Department (HOSPITAL_COMMUNITY)
Admission: EM | Admit: 2017-12-18 | Discharge: 2017-12-18 | Disposition: A | Discharge status: Home / Self Care | Payer: Medicaid Other | Attending: Emergency Medicine | Admitting: Emergency Medicine

## 2017-12-18 ENCOUNTER — Encounter (HOSPITAL_COMMUNITY): Payer: Self-pay

## 2017-12-18 DIAGNOSIS — F17210 Nicotine dependence, cigarettes, uncomplicated: Secondary | ICD-10-CM | POA: Insufficient documentation

## 2017-12-18 DIAGNOSIS — J020 Streptococcal pharyngitis: Secondary | ICD-10-CM | POA: Diagnosis not present

## 2017-12-18 DIAGNOSIS — J029 Acute pharyngitis, unspecified: Secondary | ICD-10-CM | POA: Diagnosis present

## 2017-12-18 DIAGNOSIS — R0981 Nasal congestion: Secondary | ICD-10-CM | POA: Insufficient documentation

## 2017-12-18 DIAGNOSIS — Z79899 Other long term (current) drug therapy: Secondary | ICD-10-CM | POA: Diagnosis not present

## 2017-12-18 LAB — RAPID STREP SCREEN (MED CTR MEBANE ONLY): STREPTOCOCCUS, GROUP A SCREEN (DIRECT): POSITIVE — AB

## 2017-12-18 MED ORDER — DEXAMETHASONE SODIUM PHOSPHATE 10 MG/ML IJ SOLN
10.0000 mg | Freq: Once | INTRAMUSCULAR | Status: AC
  Administered 2017-12-18: 10 mg via INTRAMUSCULAR
  Filled 2017-12-18: qty 1

## 2017-12-18 MED ORDER — PENICILLIN G BENZATHINE 1200000 UNIT/2ML IM SUSP
1.2000 10*6.[IU] | Freq: Once | INTRAMUSCULAR | Status: AC
  Administered 2017-12-18: 1.2 10*6.[IU] via INTRAMUSCULAR
  Filled 2017-12-18: qty 2

## 2017-12-18 NOTE — ED Triage Notes (Signed)
Pt reports sore throat x 2 days. Pain with swallowing. NAD

## 2017-12-18 NOTE — Discharge Instructions (Signed)
You have been treated for strep throat with antibiotics and steroids today. You may alternate 600 mg of ibuprofen and [phone] mg of Tylenol every 3 hours as needed for pain and fever. Do not exceed 4000 mg of Tylenol daily.   Continue to stay well-hydrated. Gargle warm salt water and spit it out for sore throat. May also use cough drops, warm teas, etc. Take flonase to decrease nasal congestion. Zyrtec for nasal congestion and scratchy throat.   Followup with your primary care doctor in 3-5 days for recheck of ongoing symptoms. Return to emergency department for emergent changing or worsening of symptoms such as throat tightness, facial swelling, fever not controlled by ibuprofen or Tylenol,difficulty breathing, or chest pain.

## 2017-12-18 NOTE — ED Provider Notes (Signed)
[facility]   CSN: [id number] Arrival date & time: 12/18/17  0706     History   Chief Complaint Chief Complaint  Patient presents with  . Sore Throat    HPI [name] is a 32 y.o. female presents today with complaint of acute onset, constant sore throat for 2 days.  She has associated nasal congestion but denies fevers, chills, cough, nausea, vomiting, chest pain, or shortness of breath.  She has not tried anything for her symptoms.  No known sick contacts.  She denies facial swelling or drooling.  She is tolerating p.o. food and fluids but states it hurts to swallow.  The history is provided by the patient.    Past Medical History:  Diagnosis Date  . Obesity     Patient Active Problem List   Diagnosis Date Noted  . Obesity, Class III, BMI 40-49.9 (morbid obesity) (HCC) 03/06/2017  . Tobacco use 03/06/2017    Past Surgical History:  Procedure Laterality Date  . BREAST BIOPSY Left 07/31/2013   Procedure: IRRIGATION AND DEBRIDMENT LEFT BREAST ABSCESS;  Surgeon: Lodema PilotBrian Layton, DO;  Location: [facility];  Service: General;  Laterality: Left;  IRRIGATION AND DEBRIDEMENT LEFT BREAST ABSCESS  . CESAREAN SECTION      OB History    No data available       Home Medications    Prior to Admission medications   Medication Sig Start Date End Date Taking? Authorizing Provider  amitriptyline (ELAVIL) 25 MG tablet TK 1 T PO QHS PRN 01/28/17   [provider]  baclofen (LIORESAL) 10 MG tablet Take 1 tablet (10 mg total) by mouth 3 (three) times daily. As muscle relaxer Patient not taking: Reported on 03/06/2017 02/08/17   Linna HoffKindl, James D, MD  FLUoxetine (PROZAC) 10 MG capsule TAKE 1 CAPSULE(S) BY MOUTH DAILY FOR ANXIETY/DEPRESSION 02/14/17   [provider]  gabapentin (NEURONTIN) 100 MG capsule TAKE 1 TABLET(S) BY MOUTH TWICE A DAY FOR ANXIETY 02/14/17   [provider]  lamoTRIgine (LAMICTAL) 25 MG tablet  TAKE 1 TABLET BY MOUTH AT BEDTIME FOR 2WKS THEN INCREASE TO 2 TABS BY MOUTH AT BEDTIME FOR MOOD 02/14/17   [provider]  metroNIDAZOLE (FLAGYL) 500 MG tablet Take 1 tablet (500 mg total) by mouth 2 (two) times daily. 03/11/17   Reva BoresPratt, Tanya S, MD  naproxen (NAPROSYN) 500 MG tablet Take 1 tablet (500 mg total) by mouth 2 (two) times daily. 05/19/17   Robinson, [place] N, PA-C  norgestimate-ethinyl estradiol (ORTHO-CYCLEN,SPRINTEC,PREVIFEM) 0.25-35 MG-MCG tablet Take 1 tablet by mouth daily. 03/06/17   Reva BoresPratt, Tanya S, MD  Vitamin D, Ergocalciferol, (DRISDOL) 50000 units CAPS capsule take 1 capsule by mouth 1 weekly 02/06/17   [provider]    Family History Family History  Problem Relation Age of Onset  . Diabetes Father     Social History Social History   Tobacco Use  . Smoking status: Current Every Day Smoker    Packs/day: 1.00    Types: Cigarettes  . Smokeless tobacco: Never Used  Substance Use Topics  . Alcohol use: No  . Drug use: No     Allergies   Patient has no known allergies.   Review of Systems Review of Systems  Constitutional: Negative for chills and fever.  HENT: Positive for congestion and sore throat. Negative for drooling and trouble swallowing.   Respiratory: Negative for cough and shortness of breath.   Cardiovascular: Negative for chest pain.  Gastrointestinal: Negative for abdominal pain, nausea and vomiting.     Physical Exam Updated Vital Signs BP 121/66 (BP Location: Right Arm)   Pulse 87   Temp 98.7 F (37.1 C) (Oral)   Resp 16   Ht 5\' 4"  (1.626 m)   Wt 108 kg (238 lb)   SpO2 99%   BMI 40.85 kg/m   Physical Exam  Constitutional: She appears well-developed and well-nourished. No distress.  HENT:  Head: Normocephalic and atraumatic.  Right Ear: Tympanic membrane normal.  Left Ear: Tympanic membrane normal.  Mouth/Throat: Uvula is midline and oropharynx is clear and moist. Tonsils are 2+ on the right. Tonsils are 2+ on the  left. Tonsillar exudate.  No frontal or maxillary sinus TTP.  Nasal septum is midline with mild mucosal edema bilaterally.  Posterior oropharynx with erythema and tonsillar hypertrophy but no uvular deviation.  No trismus or sublingual abnormalities.  Eyes: Conjunctivae are normal. Right eye exhibits no discharge. Left eye exhibits no discharge.  Neck: Normal range of motion. Neck supple. No JVD present. No tracheal deviation present.  Bilateral anterior cervical LAD  Cardiovascular: Normal rate, regular rhythm, normal heart sounds and intact distal pulses.  Pulmonary/Chest: Effort normal and breath sounds normal.  Abdominal: Soft. Bowel sounds are normal. She exhibits no distension. There is no tenderness.  Musculoskeletal: She exhibits no edema.  Lymphadenopathy:    She has cervical adenopathy.  Neurological: She is alert.  Skin: Skin is warm and dry. No erythema.  Psychiatric: She has a normal mood and affect. Her behavior is normal.  Nursing note and vitals reviewed.    ED Treatments / Results  Labs (all labs ordered are listed, but only abnormal results are displayed) Labs Reviewed  RAPID STREP SCREEN (NOT AT [facility]) - Abnormal; Notable for the following components:      Result Value   Streptococcus, Group A Screen (Direct) POSITIVE (*)    All other components within normal limits    EKG  EKG Interpretation None       Radiology No results found.  Procedures Procedures (including critical care time)  Medications Ordered in ED Medications  penicillin g benzathine (BICILLIN LA) 1200000 UNIT/2ML injection 1.2 Million Units (not administered)  dexamethasone (DECADRON) injection 10 mg (not administered)     Initial Impression / Assessment and Plan / ED Course  I have reviewed the triage vital signs and the nursing notes.  Pertinent labs & imaging results that were available during my care of the patient were reviewed by me and considered in my medical decision making  (see chart for details).     Pt with tonsillar exudate, cervical lymphadenopathy, & dysphagia, positive rapid strep test. Treated in the ED with steroids and PCN IM.  Pt appears mildly dehydrated, discussed importance of water rehydration. Presentation non concerning for PTA or infxn spread to soft tissue. No trismus or uvula deviation.  No fever or meningeal signs to suggest meningitis.  Specific return precautions discussed. Pt able to drink water in ED without difficulty with intact air way. Recommended PCP follow up. Pt verbalized understanding of and agreement with plan and is safe for discharge home at this time.  No complaints prior to discharge.    Final Clinical Impressions(s) / ED Diagnoses   Final diagnoses:  Strep pharyngitis    ED Discharge Orders    None       Jeanie Sewer, PA-C 12/18/17 6962    Gwyneth Sprout, MD 12/18/17 2118

## 2018-05-03 ENCOUNTER — Emergency Department (HOSPITAL_COMMUNITY)
Admission: EM | Admit: 2018-05-03 | Discharge: 2018-05-03 | Disposition: A | Discharge status: Home / Self Care | Payer: Medicaid Other | Attending: Emergency Medicine | Admitting: Emergency Medicine

## 2018-05-03 ENCOUNTER — Encounter (HOSPITAL_COMMUNITY): Payer: Self-pay | Admitting: Emergency Medicine

## 2018-05-03 DIAGNOSIS — R51 Headache: Secondary | ICD-10-CM | POA: Diagnosis not present

## 2018-05-03 DIAGNOSIS — H53149 Visual discomfort, unspecified: Secondary | ICD-10-CM | POA: Insufficient documentation

## 2018-05-03 DIAGNOSIS — R112 Nausea with vomiting, unspecified: Secondary | ICD-10-CM | POA: Insufficient documentation

## 2018-05-03 DIAGNOSIS — R1013 Epigastric pain: Secondary | ICD-10-CM | POA: Diagnosis not present

## 2018-05-03 DIAGNOSIS — Z79891 Long term (current) use of opiate analgesic: Secondary | ICD-10-CM | POA: Diagnosis not present

## 2018-05-03 DIAGNOSIS — F17210 Nicotine dependence, cigarettes, uncomplicated: Secondary | ICD-10-CM | POA: Insufficient documentation

## 2018-05-03 DIAGNOSIS — R519 Headache, unspecified: Secondary | ICD-10-CM

## 2018-05-03 LAB — COMPREHENSIVE METABOLIC PANEL
ALT: 13 U/L — AB (ref 14–54)
AST: 32 U/L (ref 15–41)
Albumin: 3.5 g/dL (ref 3.5–5.0)
Alkaline Phosphatase: 102 U/L (ref 38–126)
Anion gap: 13 (ref 5–15)
BUN: 6 mg/dL (ref 6–20)
CHLORIDE: 102 mmol/L (ref 101–111)
CO2: 18 mmol/L — AB (ref 22–32)
Calcium: 8.9 mg/dL (ref 8.9–10.3)
Creatinine, Ser: 0.9 mg/dL (ref 0.44–1.00)
Glucose, Bld: 126 mg/dL — ABNORMAL HIGH (ref 65–99)
POTASSIUM: 3.7 mmol/L (ref 3.5–5.1)
Sodium: 133 mmol/L — ABNORMAL LOW (ref 135–145)
Total Bilirubin: 0.7 mg/dL (ref 0.3–1.2)
Total Protein: 7.8 g/dL (ref 6.5–8.1)

## 2018-05-03 LAB — CBC
HEMATOCRIT: 43.4 % (ref 36.0–46.0)
Hemoglobin: 14.6 g/dL (ref 12.0–15.0)
MCH: 31.5 pg (ref 26.0–34.0)
MCHC: 33.6 g/dL (ref 30.0–36.0)
MCV: 93.7 fL (ref 78.0–100.0)
Platelets: 213 10*3/uL (ref 150–400)
RBC: 4.63 MIL/uL (ref 3.87–5.11)
RDW: 13.6 % (ref 11.5–15.5)
WBC: 6.5 10*3/uL (ref 4.0–10.5)

## 2018-05-03 LAB — LIPASE, BLOOD: LIPASE: 24 U/L (ref 11–51)

## 2018-05-03 LAB — URINALYSIS, ROUTINE W REFLEX MICROSCOPIC
Bilirubin Urine: NEGATIVE
GLUCOSE, UA: NEGATIVE mg/dL
HGB URINE DIPSTICK: NEGATIVE
Ketones, ur: 5 mg/dL — AB
LEUKOCYTES UA: NEGATIVE
Nitrite: NEGATIVE
PH: 6 (ref 5.0–8.0)
Protein, ur: NEGATIVE mg/dL
SPECIFIC GRAVITY, URINE: 1.02 (ref 1.005–1.030)

## 2018-05-03 LAB — POC URINE PREG, ED: Preg Test, Ur: NEGATIVE

## 2018-05-03 MED ORDER — PROCHLORPERAZINE EDISYLATE 10 MG/2ML IJ SOLN
10.0000 mg | Freq: Once | INTRAMUSCULAR | Status: AC
  Administered 2018-05-03: 10 mg via INTRAVENOUS
  Filled 2018-05-03: qty 2

## 2018-05-03 MED ORDER — ONDANSETRON 4 MG PO TBDP: ORAL_TABLET | ORAL | 0 refills | Status: DC

## 2018-05-03 MED ORDER — SODIUM CHLORIDE 0.9 % IV BOLUS
1000.0000 mL | Freq: Once | INTRAVENOUS | Status: AC
  Administered 2018-05-03: 1000 mL via INTRAVENOUS

## 2018-05-03 MED ORDER — DIPHENHYDRAMINE HCL 50 MG/ML IJ SOLN
25.0000 mg | Freq: Once | INTRAMUSCULAR | Status: AC
  Administered 2018-05-03: 25 mg via INTRAVENOUS
  Filled 2018-05-03: qty 1

## 2018-05-03 MED ORDER — NAPROXEN 500 MG PO TABS
500.0000 mg | ORAL_TABLET | Freq: Two times a day (BID) | ORAL | 0 refills | Status: DC
Start: 2018-05-03 — End: 2018-08-03

## 2018-05-03 NOTE — Discharge Instructions (Signed)
Your evaluation today is reassuring, lab work looks good overall.  Please use Naprosyn for headaches as needed, you may use Zofran as needed for nausea.  Please make sure you are drinking plenty of fluids, dehydration can make headaches worse.  Please follow-up with your primary care doctor for continued evaluation.  Return to the emergency department for evaluation of worsening headache, persistent vomiting, focal abdominal pain, fevers, vision changes, dizziness, weakness or numbness or any other new or concerning symptoms.

## 2018-05-03 NOTE — ED Triage Notes (Signed)
Pt reports abd pains, n/v, sweats, chills, headache x 3 days.

## 2018-05-03 NOTE — ED Provider Notes (Addendum)
[facility]-EMERGENCY DEPT Provider Note   CSN: [id number] Arrival date & time: 05/03/18  1710     History   Chief Complaint Chief Complaint  Patient presents with  . Abdominal Pain  . Emesis    HPI [name] is a 32 y.o. female.  [name] is a 32 y.o. Female History of obesity and migraines, presents to the emergency department for evaluation of headache, nausea, vomiting and epigastric abdominal pain.  Patient reports headache started about 2 days ago, this is similar to her typical migraines but worse than usual.  Patient has not taken anything at home to treat this headache.  She reports it is at the back of her head and on both sides and is a constant dull ache.  Patient has gradually gotten worse and was not maximum in intensity at onset.  Patient reports light sensitivity, but denies any vision changes, she reports occasionally feeling a little bit dizzy, and just fatigued in general.  Patient denies any numbness, weakness or tingling in any extremities, no facial droop or difficulty with speech.  No head trauma.  Patient reports associated nausea with 2 episodes of nonbloody nonbilious emesis.  Patient reports after vomiting she had some mild epigastric pain.  Patient denies any lower abdominal pain, she denies fevers but reports some intermittent chills.  She denies any urinary symptoms or vaginal discharge, no diarrhea, melena or hematochezia.  Patient denies chest pain or shortness of breath.     Past Medical History:  Diagnosis Date  . Obesity     Patient Active Problem List   Diagnosis Date Noted  . Obesity, Class III, BMI 40-49.9 (morbid obesity) (HCC) 03/06/2017  . Tobacco use 03/06/2017    Past Surgical History:  Procedure Laterality Date  . BREAST BIOPSY Left 07/31/2013   Procedure: IRRIGATION AND DEBRIDMENT LEFT BREAST ABSCESS;  Surgeon: Lodema Pilot, DO;  Location: [facility];  Service: General;  Laterality: Left;  IRRIGATION  AND DEBRIDEMENT LEFT BREAST ABSCESS  . CESAREAN SECTION       OB History   None      Home Medications    Prior to Admission medications   Medication Sig Start Date End Date Taking? Authorizing Provider  amitriptyline (ELAVIL) 25 MG tablet TK 1 T PO QHS PRN 01/28/17   [provider]  baclofen (LIORESAL) 10 MG tablet Take 1 tablet (10 mg total) by mouth 3 (three) times daily. As muscle relaxer Patient not taking: Reported on 03/06/2017 02/08/17   Linna Hoff, MD  FLUoxetine (PROZAC) 10 MG capsule TAKE 1 CAPSULE(S) BY MOUTH DAILY FOR ANXIETY/DEPRESSION 02/14/17   [provider]  gabapentin (NEURONTIN) 100 MG capsule TAKE 1 TABLET(S) BY MOUTH TWICE A DAY FOR ANXIETY 02/14/17   [provider]  lamoTRIgine (LAMICTAL) 25 MG tablet TAKE 1 TABLET BY MOUTH AT BEDTIME FOR 2WKS THEN INCREASE TO 2 TABS BY MOUTH AT BEDTIME FOR MOOD 02/14/17   [provider]  metroNIDAZOLE (FLAGYL) 500 MG tablet Take 1 tablet (500 mg total) by mouth 2 (two) times daily. 03/11/17   Reva Bores, MD  naproxen (NAPROSYN) 500 MG tablet Take 1 tablet (500 mg total) by mouth 2 (two) times daily. 05/19/17   Robinson, [place] N, PA-C  norgestimate-ethinyl estradiol (ORTHO-CYCLEN,SPRINTEC,PREVIFEM) 0.25-35 MG-MCG tablet Take 1 tablet by mouth daily. 03/06/17   Reva Bores, MD  Vitamin D, Ergocalciferol, (DRISDOL) 50000 units CAPS capsule take 1 capsule by mouth 1 weekly 02/06/17   [provider]    Family History Family History  Problem Relation Age of Onset  . Diabetes Father     Social History Social History   Tobacco Use  . Smoking status: Current Every Day Smoker    Packs/day: 1.00    Types: Cigarettes  . Smokeless tobacco: Never Used  Substance Use Topics  . Alcohol use: No  . Drug use: No     Allergies   Patient has no known allergies.   Review of Systems Review of Systems  Constitutional: Positive for chills. Negative for fever.  HENT: Negative for  congestion, ear discharge, ear pain, rhinorrhea and sore throat.   Eyes: Positive for photophobia. Negative for pain, redness and visual disturbance.  Respiratory: Negative for cough and shortness of breath.   Cardiovascular: Negative for chest pain, palpitations and leg swelling.  Gastrointestinal: Positive for nausea and vomiting. Negative for blood in stool and diarrhea.  Genitourinary: Negative for dysuria, flank pain, frequency and hematuria.  Musculoskeletal: Negative for arthralgias, joint swelling, neck pain and neck stiffness.  Skin: Negative for color change and rash.  Neurological: Positive for headaches. Negative for dizziness, syncope, facial asymmetry, speech difficulty, weakness, light-headedness and numbness.     Physical Exam Updated Vital Signs BP 126/85 (BP Location: Right Arm)   Pulse (!) 114   Temp 100 F (37.8 C) (Oral)   Resp 19   LMP 04/09/2018   SpO2 100%   Physical Exam  Constitutional: She is oriented to person, place, and time. She appears well-developed and well-nourished. No distress.  HENT:  Head: Normocephalic and atraumatic.  Mouth/Throat: Oropharynx is clear and moist. No oropharyngeal exudate.  Eyes: Pupils are equal, round, and reactive to light. EOM are normal. Right eye exhibits no discharge. Left eye exhibits no discharge.  Neck: Normal range of motion. Neck supple.  No nuchal rigidity, negative Brudzinski's and Kernig sign  Cardiovascular: Normal rate, regular rhythm, normal heart sounds and intact distal pulses.  Pulmonary/Chest: Effort normal and breath sounds normal. No stridor. No respiratory distress. She has no wheezes. She has no rales.  Respirations equal and unlabored, patient able to speak in full sentences, lungs clear to auscultation bilaterally  Abdominal: Soft. Bowel sounds are normal. She exhibits no distension and no mass. There is no tenderness. There is no guarding.  Abdomen soft, nondistended, nontender to palpation in all  quadrants without guarding or peritoneal signs  Musculoskeletal: She exhibits no edema or deformity.  Lymphadenopathy:    She has no cervical adenopathy.  Neurological: She is alert and oriented to person, place, and time. Coordination normal.  Speech is clear, able to follow commands CN III-XII intact Normal strength in upper and lower extremities bilaterally including dorsiflexion and plantar flexion, strong and equal grip strength Sensation normal to light and sharp touch Moves extremities without ataxia, coordination intact Normal finger to nose and rapid alternating movements No pronator drift  Skin: Skin is warm and dry. Capillary refill takes less than 2 seconds. She is not diaphoretic.  Psychiatric: She has a normal mood and affect. Her behavior is normal.  Nursing note and vitals reviewed.    ED Treatments / Results  Labs (all labs ordered are listed, but only abnormal results are displayed) Labs Reviewed  COMPREHENSIVE METABOLIC PANEL - Abnormal; Notable for the following components:      Result Value   Sodium 133 (*)    CO2 18 (*)    Glucose, Bld 126 (*)    ALT 13 (*)  All other components within normal limits  URINALYSIS, ROUTINE W REFLEX MICROSCOPIC - Abnormal; Notable for the following components:   APPearance HAZY (*)    Ketones, ur 5 (*)    All other components within normal limits  LIPASE, BLOOD  CBC  POC URINE PREG, ED    EKG None  Radiology No results found.  Procedures Procedures (including critical care time)  Medications Ordered in ED Medications  sodium chloride 0.9 % bolus 1,000 mL (1,000 mLs Intravenous New Bag/Given 05/03/18 2123)  prochlorperazine (COMPAZINE) injection 10 mg (10 mg Intravenous Given 05/03/18 2125)  diphenhydrAMINE (BENADRYL) injection 25 mg (25 mg Intravenous Given 05/03/18 2123)     Initial Impression / Assessment and Plan / ED Course  I have reviewed the triage vital signs and the nursing notes.  Pertinent labs &  imaging results that were available during my care of the patient were reviewed by me and considered in my medical decision making (see chart for details).  Patient presents with headache, with associated nausea and vomiting she reports after vomiting twice she had some epigastric abdominal pain.  No diarrhea, melena or hematochezia, no urinary symptoms.  Presentation is like pts typical HA and non concerning for [facility], ICH, Meningitis, or temporal arteritis. Pt is afebrile with no focal neuro deficits, nuchal rigidity, or change in vision.  Abdominal exam is benign.  Labs overall reassuring, no leukocytosis, normal hemoglobin, no acute electrolyte derangements requiring intervention, CO2 is 18, I feel like this is likely due to dehydration, normal lipase, UA without signs of infection, negative pregnancy.   Will treat with migraine cocktail, given benign abdominal exam I have low suspicion for acute surgical abdomen especially with reassuring labs.  I feel this is likely related to patient's headache, she reports she has history of migraines and frequently has nausea and vomiting with them.  On reevaluation headache has completely resolved.  Will discharge with NSAIDs and Zofran as needed for nausea.  Pt is to follow up with PCP to discuss prophylactic medication. Pt verbalizes understanding and is agreeable with plan to dc.   Vitals:   05/03/18 1725 05/03/18 2053 05/03/18 2227  BP: 126/85 102/63 118/67  Pulse: (!) 114 (!) 114 97  Resp: Temp: 100 F (37.8 C) 99.4 F (37.4 C)   TempSrc: Oral Oral   SpO2: 100% 100% 100%     Final Clinical Impressions(s) / ED Diagnoses   Final diagnoses:  Bad headache  Non-intractable vomiting with nausea, unspecified vomiting type  Epigastric pain    ED Discharge Orders        Ordered    naproxen (NAPROSYN) 500 MG tablet  2 times daily     05/03/18 2213    ondansetron (ZOFRAN ODT) 4 MG disintegrating tablet     05/03/18 2213       Dartha Lodge, PA-C 05/03/18 2213    Dartha Lodge, PA-C 05/03/18 2237    Cathren Laine, MD 05/03/18 2337

## 2018-08-03 ENCOUNTER — Ambulatory Visit (HOSPITAL_COMMUNITY)
Admission: EM | Admit: 2018-08-03 | Discharge: 2018-08-03 | Disposition: A | Discharge status: Home / Self Care | Payer: Medicaid Other | Attending: Physician Assistant | Admitting: Physician Assistant

## 2018-08-03 ENCOUNTER — Encounter (HOSPITAL_COMMUNITY): Payer: Self-pay | Admitting: *Deleted

## 2018-08-03 DIAGNOSIS — K0889 Other specified disorders of teeth and supporting structures: Secondary | ICD-10-CM | POA: Diagnosis not present

## 2018-08-03 MED ORDER — MELOXICAM 7.5 MG PO TABS: 7.5000 mg | ORAL_TABLET | Freq: Every day | ORAL | 0 refills | Status: DC

## 2018-08-03 MED ORDER — HYDROCODONE-ACETAMINOPHEN 5-325 MG PO TABS: 1.0000 | ORAL_TABLET | Freq: Four times a day (QID) | ORAL | 0 refills | Status: DC | PRN

## 2018-08-03 MED ORDER — AMOXICILLIN-POT CLAVULANATE 875-125 MG PO TABS: 1.0000 | ORAL_TABLET | Freq: Two times a day (BID) | ORAL | 0 refills | Status: DC

## 2018-08-03 NOTE — Discharge Instructions (Signed)
Start Augmentin as directed for dental infection. Mobic and oragel for pain. Norco for breakthrough pain. Follow up with dentist for further treatment and evaluation. If experiencing swelling of the throat, trouble breathing, trouble swallowing, leaning forward to breath, drooling, go to the emergency department for further evaluation.

## 2018-08-03 NOTE — ED Triage Notes (Signed)
C/O left upper and lower toothache x 2 days.

## 2018-08-03 NOTE — ED Provider Notes (Signed)
[facility]    CSN: [id number] Arrival date & time: 08/03/18  1216     History   Chief Complaint Chief Complaint  Patient presents with  . Dental Pain    HPI [name] is a 32 y.o. female.   32 year old female comes in for 2-day history of dental pain.  Has cavity to the left upper and lower jaw.  Has been painful with eating.  Denies fever, chills, night sweats.  Denies swelling to throat, trouble breathing, trouble swallowing, trismus, tripoding.  Denies facial swelling.  Has been taking Orajel without relief.     Past Medical History:  Diagnosis Date  . Obesity     Patient Active Problem List   Diagnosis Date Noted  . Obesity, Class III, BMI 40-49.9 (morbid obesity) (HCC) 03/06/2017  . Tobacco use 03/06/2017    Past Surgical History:  Procedure Laterality Date  . BREAST BIOPSY Left 07/31/2013   Procedure: IRRIGATION AND DEBRIDMENT LEFT BREAST ABSCESS;  Surgeon: Lodema PilotBrian Layton, DO;  Location: [facility];  Service: General;  Laterality: Left;  IRRIGATION AND DEBRIDEMENT LEFT BREAST ABSCESS  . CESAREAN SECTION      OB History   None      Home Medications    Prior to Admission medications   Medication Sig Start Date End Date Taking? Authorizing Provider  amoxicillin-clavulanate (AUGMENTIN) 875-125 MG tablet Take 1 tablet by mouth every 12 (twelve) hours. 08/03/18   Cathie HoopsYu, Amy V, PA-C  HYDROcodone-acetaminophen (NORCO/VICODIN) 5-325 MG tablet Take 1 tablet by mouth every 6 (six) hours as needed for severe pain. 08/03/18   Cathie HoopsYu, Amy V, PA-C  meloxicam (MOBIC) 7.5 MG tablet Take 1 tablet (7.5 mg total) by mouth daily. 08/03/18   Belinda FisherYu, Amy V, PA-C    Family History Family History  Problem Relation Age of Onset  . Diabetes Father     Social History Social History   Tobacco Use  . Smoking status: Current Every Day Smoker    Packs/day: 1.00    Types: Cigarettes  . Smokeless tobacco: Never Used  Substance Use Topics  . Alcohol use: No  . Drug use:  Never     Allergies   Patient has no known allergies.   Review of Systems Review of Systems  Reason unable to perform ROS: See HPI as above.     Physical Exam Triage Vital Signs ED Triage Vitals  Enc Vitals Group     BP 08/03/18 1301 113/75     Pulse Rate 08/03/18 1301 75     Resp 08/03/18 1301 18     Temp 08/03/18 1301 98.5 F (36.9 C)     Temp Source 08/03/18 1301 Oral     SpO2 08/03/18 1301 100 %     Weight --      Height --      Head Circumference --      Peak Flow --      Pain Score 08/03/18 1302 10     Pain Loc --      Pain Edu? --      Excl. in GC? --    No data found.  Updated Vital Signs BP 113/75   Pulse 75   Temp 98.5 F (36.9 C) (Oral)   Resp 18   LMP 08/01/2018 (Approximate)   SpO2 100%   Physical Exam  Constitutional: She is oriented to person, place, and time. She appears well-developed and well-nourished. No distress.  HENT:  Head: Normocephalic and atraumatic.  Mouth/Throat: Uvula  is midline, oropharynx is clear and moist and mucous membranes are normal. No trismus in the jaw. No uvula swelling. No tonsillar exudate.    Tenderness to palpation upper and lower left gums.  No obvious fluctuance felt.   Floor of mouth soft to palpation. No obvious facial swelling.   Neck: Normal range of motion. Neck supple.  Neurological: She is alert and oriented to person, place, and time.  Skin: Skin is warm and dry. She is not diaphoretic.     UC Treatments / Results  Labs (all labs ordered are listed, but only abnormal results are displayed) Labs Reviewed - No data to display  EKG None  Radiology No results found.  Procedures Procedures (including critical care time)  Medications Ordered in UC Medications - No data to display  Initial Impression / Assessment and Plan / UC Course  I have reviewed the triage vital signs and the nursing notes.  Pertinent labs & imaging results that were available during my care of the patient were  reviewed by me and considered in my medical decision making (see chart for details).    Start antibiotics for possible dental infection. Symptomatic treatment as needed. Discussed with patient symptoms can return if dental problem is not addressed. Follow up with dentist for further evaluation and treatment of dental pain. Resources given. Return precautions given.   Final Clinical Impressions(s) / UC Diagnoses   Final diagnoses:  Pain, dental    ED Prescriptions    Medication Sig Dispense Auth. Provider   amoxicillin-clavulanate (AUGMENTIN) 875-125 MG tablet Take 1 tablet by mouth every 12 (twelve) hours. 14 tablet Yu, Amy V, PA-C   meloxicam (MOBIC) 7.5 MG tablet Take 1 tablet (7.5 mg total) by mouth daily. 15 tablet Yu, Amy V, PA-C   HYDROcodone-acetaminophen (NORCO/VICODIN) 5-325 MG tablet Take 1 tablet by mouth every 6 (six) hours as needed for severe pain. 10 tablet Belinda FisherYu, Amy V, PA-C     Controlled Substance Prescriptions [place] Controlled Substance Registry consulted? Yes, I have consulted the [place] Controlled Substances Registry for this patient, and feel the risk/benefit ratio today is favorable for proceeding with this prescription for a controlled substance.   Belinda FisherYu, Amy V, PA-C 08/03/18 1353

## 2018-08-29 ENCOUNTER — Encounter (HOSPITAL_COMMUNITY): Payer: Self-pay

## 2018-08-29 ENCOUNTER — Ambulatory Visit (HOSPITAL_COMMUNITY)
Admission: EM | Admit: 2018-08-29 | Discharge: 2018-08-29 | Disposition: A | Discharge status: Home / Self Care | Payer: Medicaid Other | Attending: Family Medicine | Admitting: Family Medicine

## 2018-08-29 DIAGNOSIS — K0889 Other specified disorders of teeth and supporting structures: Secondary | ICD-10-CM

## 2018-08-29 MED ORDER — CHLORHEXIDINE GLUCONATE 0.12 % MT SOLN: 15.0000 mL | Freq: Two times a day (BID) | OROMUCOSAL | 0 refills | Status: DC

## 2018-08-29 MED ORDER — MELOXICAM 7.5 MG PO TABS: 7.5000 mg | ORAL_TABLET | Freq: Every day | ORAL | 0 refills | Status: DC

## 2018-08-29 MED ORDER — OXYCODONE-ACETAMINOPHEN 5-325 MG PO TABS: 1.0000 | ORAL_TABLET | Freq: Three times a day (TID) | ORAL | 0 refills | Status: DC | PRN

## 2018-08-29 MED ORDER — CLINDAMYCIN HCL 150 MG PO CAPS: 450.0000 mg | ORAL_CAPSULE | Freq: Three times a day (TID) | ORAL | 0 refills | Status: AC

## 2018-08-29 NOTE — Discharge Instructions (Signed)
Start Clindamycin as directed for dental infection. Mobic as directed, percocet for breakthrough pain. Please rinse your mouth with peridex as directed. You will need to call your dentist/oral surgeon as soon as possible and follow up for evaluation. If experiencing swelling of the throat, trouble breathing, trouble swallowing, leaning forward to breath, drooling, go to the emergency department for further evaluation.

## 2018-08-29 NOTE — ED Provider Notes (Signed)
[facility]    CSN: [id number] Arrival date & time: 08/29/18  1519     History   Chief Complaint Chief Complaint  Patient presents with  . Oral Swelling    HPI [name] is a 32 y.o. female.   32 year old female comes in for dental pain/gum swelling post procedure. States 4 days ago had a tooth pulled that required them to remove some of her gum. States has been taking ibuprofen and percocet with worsening pain and increased swelling to the gums. She has been brushing her teeth to maintain oral hygiene. Denies fever, chills, night sweats. Denies trouble swallowing, trouble breathing, swelling of the throat.     Past Medical History:  Diagnosis Date  . Obesity     Patient Active Problem List   Diagnosis Date Noted  . Obesity, Class III, BMI 40-49.9 (morbid obesity) (HCC) 03/06/2017  . Tobacco use 03/06/2017    Past Surgical History:  Procedure Laterality Date  . BREAST BIOPSY Left 07/31/2013   Procedure: IRRIGATION AND DEBRIDMENT LEFT BREAST ABSCESS;  Surgeon: Lodema Pilot, DO;  Location: [facility];  Service: General;  Laterality: Left;  IRRIGATION AND DEBRIDEMENT LEFT BREAST ABSCESS  . CESAREAN SECTION      OB History   None      Home Medications    Prior to Admission medications   Medication Sig Start Date End Date Taking? Authorizing Provider  chlorhexidine (PERIDEX) 0.12 % solution Use as directed 15 mLs in the mouth or throat 2 (two) times daily. 08/29/18   Cathie Hoops, Evalynn Hankins V, PA-C  clindamycin (CLEOCIN) 150 MG capsule Take 3 capsules (450 mg total) by mouth 3 (three) times daily for 7 days. 08/29/18 09/05/18  Belinda Fisher, PA-C  meloxicam (MOBIC) 7.5 MG tablet Take 1 tablet (7.5 mg total) by mouth daily. 08/29/18   Cathie Hoops, Jalie Eiland V, PA-C  oxyCODONE-acetaminophen (PERCOCET/ROXICET) 5-325 MG tablet Take 1 tablet by mouth every 8 (eight) hours as needed for severe pain. 08/29/18   Belinda Fisher, PA-C    Family History Family History  Problem Relation Age of Onset  .  Diabetes Father     Social History Social History   Tobacco Use  . Smoking status: Current Every Day Smoker    Packs/day: 1.00    Types: Cigarettes  . Smokeless tobacco: Never Used  Substance Use Topics  . Alcohol use: No  . Drug use: Never     Allergies   Patient has no known allergies.   Review of Systems Review of Systems  Reason unable to perform ROS: See HPI as above.     Physical Exam Triage Vital Signs ED Triage Vitals  Enc Vitals Group     BP 08/29/18 1542 133/76     Pulse Rate 08/29/18 1542 75     Resp 08/29/18 1542 20     Temp 08/29/18 1542 98.3 F (36.8 C)     Temp Source 08/29/18 1542 Temporal     SpO2 08/29/18 1542 100 %     Weight --      Height --      Head Circumference --      Peak Flow --      Pain Score 08/29/18 1541 10     Pain Loc --      Pain Edu? --      Excl. in GC? --    No data found.  Updated Vital Signs BP 133/76 (BP Location: Right Arm)   Pulse 75  Temp 98.3 F (36.8 C) (Temporal)   Resp 20   LMP 08/01/2018 (Approximate)   SpO2 100%   Physical Exam  Constitutional: She is oriented to person, place, and time. She appears well-developed and well-nourished. No distress.  HENT:  Head: Normocephalic and atraumatic.  Mouth/Throat: Uvula is midline, oropharynx is clear and moist and mucous membranes are normal. No trismus in the jaw. No uvula swelling. No tonsillar exudate.    Tender to palpation along the left upper posterior molar.  No obvious fluctuance felt.   Floor of mouth soft to palpation. No obvious facial swelling.   Neck: Normal range of motion. Neck supple.  Neurological: She is alert and oriented to person, place, and time.  Skin: Skin is warm and dry. She is not diaphoretic.     UC Treatments / Results  Labs (all labs ordered are listed, but only abnormal results are displayed) Labs Reviewed - No data to display  EKG None  Radiology No results found.  Procedures Procedures (including critical  care time)  Medications Ordered in UC Medications - No data to display  Initial Impression / Assessment and Plan / UC Course  I have reviewed the triage vital signs and the nursing notes.  Pertinent labs & imaging results that were available during my care of the patient were reviewed by me and considered in my medical decision making (see chart for details).    Given post dental procedure with increased pain and swelling, will start patient on clindamycin to prevent infection. Mobic as directed, percocet for breakthrough pain. peridex for oral hygiene. Patient to follow up with dentist/oral surgeon who did procedure for further evaluation and management needed. Return precautions given.  Final Clinical Impressions(s) / UC Diagnoses   Final diagnoses:  Pain, dental    ED Prescriptions    Medication Sig Dispense Auth. Provider   meloxicam (MOBIC) 7.5 MG tablet Take 1 tablet (7.5 mg total) by mouth daily. 15 tablet Chena Chohan V, PA-C   clindamycin (CLEOCIN) 150 MG capsule Take 3 capsules (450 mg total) by mouth 3 (three) times daily for 7 days. 63 capsule Shawn Dannenberg V, PA-C   chlorhexidine (PERIDEX) 0.12 % solution Use as directed 15 mLs in the mouth or throat 2 (two) times daily. 240 mL Dalaya Suppa V, PA-C   oxyCODONE-acetaminophen (PERCOCET/ROXICET) 5-325 MG tablet Take 1 tablet by mouth every 8 (eight) hours as needed for severe pain. 10 tablet Belinda FisherYu, Vickee Mormino V, PA-C     Controlled Substance Prescriptions [place] Controlled Substance Registry consulted? Yes, I have consulted the [place] Controlled Substances Registry for this patient, and feel the risk/benefit ratio today is favorable for proceeding with this prescription for a controlled substance.   Belinda FisherYu, Daelen Belvedere V, PA-C 08/29/18 1610

## 2018-08-29 NOTE — ED Triage Notes (Signed)
Pt presents with ongoing gum pain.

## 2018-10-27 ENCOUNTER — Ambulatory Visit (INDEPENDENT_AMBULATORY_CARE_PROVIDER_SITE_OTHER): Payer: Medicaid Other

## 2018-10-27 ENCOUNTER — Ambulatory Visit (HOSPITAL_COMMUNITY)
Admission: EM | Admit: 2018-10-27 | Discharge: 2018-10-27 | Disposition: A | Discharge status: Home / Self Care | Payer: Medicaid Other | Attending: Family Medicine | Admitting: Family Medicine

## 2018-10-27 ENCOUNTER — Other Ambulatory Visit: Payer: Self-pay

## 2018-10-27 ENCOUNTER — Encounter (HOSPITAL_COMMUNITY): Payer: Self-pay | Admitting: Emergency Medicine

## 2018-10-27 DIAGNOSIS — S93402A Sprain of unspecified ligament of left ankle, initial encounter: Secondary | ICD-10-CM

## 2018-10-27 DIAGNOSIS — M25572 Pain in left ankle and joints of left foot: Secondary | ICD-10-CM | POA: Diagnosis not present

## 2018-10-27 MED ORDER — MELOXICAM 15 MG PO TABS: 15.0000 mg | ORAL_TABLET | Freq: Every day | ORAL | 1 refills | Status: DC

## 2018-10-27 NOTE — ED Triage Notes (Signed)
Pt here with left ankle pain after twisting ankle when stepping off curb today

## 2018-10-27 NOTE — Discharge Instructions (Signed)
Ice your ankle for 20 minutes every 2 hours for the first 24-48 hours.

## 2018-10-27 NOTE — Discharge Instructions (Signed)
Your x-ray was negative for any fracture this is just a bad ankle sprain. We will do an ASO ankle brace Rest, elevate and ice the ankle You can take ibuprofen 600 mg every 6 hours for pain and inflammation Follow up as needed for continued or worsening symptoms

## 2018-10-27 NOTE — ED Provider Notes (Signed)
  MRN: [id number] DOB: 11-09-1986  Subjective:   [name] is a 32 y.o. female presenting for recheck on her left ankle sprain.  Patient was here earlier today, had negative x-rays.  She was provided with a prescription for ibuprofen and given an ASO ankle brace.  Currently, she states that her swelling is worse, pain is also worse and not controlled with ibuprofen.  She states that she does not want to go to work tomorrow and admits that she has not propped her leg up as she was advised to do.  Denies further injuring her ankle including any falls.  She is also not use any ice.  Is taking only ibuprofen which was prescribed today.  No Known Allergies  Past Medical History:  Diagnosis Date  . Obesity      Past Surgical History:  Procedure Laterality Date  . BREAST BIOPSY Left 07/31/2013   Procedure: IRRIGATION AND DEBRIDMENT LEFT BREAST ABSCESS;  Surgeon: Lodema Pilot, DO;  Location: [facility];  Service: General;  Laterality: Left;  IRRIGATION AND DEBRIDEMENT LEFT BREAST ABSCESS  . CESAREAN SECTION      Objective:   Vitals: BP (!) 141/84 (BP Location: Right Arm)   Pulse 82   Temp 98.2 F (36.8 C)   Resp 18   LMP 10/24/2018   SpO2 100%   Physical Exam  Constitutional: She is oriented to person, place, and time. She appears well-developed and well-nourished.  Cardiovascular: Normal rate.  Pulmonary/Chest: Effort normal.  Musculoskeletal:  Patient is wearing her ASO ankle brace.  She has good coloration of her distal foot and good warmth.  Dorsalis pedis is 2+.    Neurological: She is alert and oriented to person, place, and time.    Dg Ankle Complete Left  Result Date: 10/27/2018 CLINICAL DATA:  Trip and fall with ankle pain, initial encounter EXAM: LEFT ANKLE COMPLETE - 3+ VIEW COMPARISON:  None. FINDINGS: Generalized soft tissue swelling is noted about the ankle. No acute fracture or dislocation is noted. IMPRESSION: Soft tissue swelling without acute bony abnormality.  Electronically Signed   By: Alcide Clever M.D.   On: 10/27/2018 11:14   Assessment and Plan :   Acute left ankle pain  Sprain of left ankle, unspecified ligament, initial encounter  I offered patient a Toradol injection which she refused.  Send a prescription for meloxicam at 15 mg once daily given the severity of her pain.  Provided her with a work note and reemphasized rice method for management of her ankle sprain.  ER and return to clinic precautions reviewed.     Wallis Bamberg, [place] 10/27/18 2041

## 2018-10-27 NOTE — ED Provider Notes (Signed)
[facility]    CSN: [id number] Arrival date & time: 10/27/18  1025     History   Chief Complaint Chief Complaint  Patient presents with  . Ankle Pain    HPI [name] is a 32 y.o. female.    Ankle Pain  Location:  Ankle Time since incident:  2 hours Injury: yes   Mechanism of injury: fall   Fall:    Fall occurred:  Walking   Height of fall:  From standing  Ankle location:  L ankle Pain details:    Quality:  Dull and aching   Radiates to:  Does not radiate   Severity:  Mild Chronicity:  New Dislocation: no   Foreign body present:  No foreign bodies Prior injury to area:  No Relieved by:  Nothing Worsened by:  Nothing Ineffective treatments:  None tried Associated symptoms: no back pain, no decreased ROM, no fatigue, no fever, no itching, no muscle weakness, no neck pain, no numbness, no stiffness, no swelling and no tingling     Past Medical History:  Diagnosis Date  . Obesity     Patient Active Problem List   Diagnosis Date Noted  . Obesity, Class III, BMI 40-49.9 (morbid obesity) (HCC) 03/06/2017  . Tobacco use 03/06/2017    Past Surgical History:  Procedure Laterality Date  . BREAST BIOPSY Left 07/31/2013   Procedure: IRRIGATION AND DEBRIDMENT LEFT BREAST ABSCESS;  Surgeon: Lodema Pilot, DO;  [facility];  Service: General;  Laterality: Left;  IRRIGATION AND DEBRIDEMENT LEFT BREAST ABSCESS  . CESAREAN SECTION      OB History   None      Home Medications    Prior to Admission medications   Medication Sig Start Date End Date Taking? Authorizing Provider  chlorhexidine (PERIDEX) 0.12 % solution Use as directed 15 mLs in the mouth or throat 2 (two) times daily. 08/29/18   Cathie Hoops, Amy V, PA-C  meloxicam (MOBIC) 7.5 MG tablet Take 1 tablet (7.5 mg total) by mouth daily. 08/29/18   Cathie Hoops, Amy V, PA-C  oxyCODONE-acetaminophen (PERCOCET/ROXICET) 5-325 MG tablet Take 1 tablet by mouth every 8 (eight) hours as needed for severe  pain. Patient not taking: Reported on 10/27/2018 08/29/18   Lurline Idol    Family History Family History  Problem Relation Age of Onset  . Diabetes Father     Social History Social History   Tobacco Use  . Smoking status: Current Every Day Smoker    Packs/day: 1.00    Types: Cigarettes  . Smokeless tobacco: Never Used  Substance Use Topics  . Alcohol use: No  . Drug use: Never     Allergies   Patient has no known allergies.   Review of Systems Review of Systems  Constitutional: Negative for fatigue and fever.  Musculoskeletal: Negative for back pain, neck pain and stiffness.  Skin: Negative for itching.     Physical Exam Triage Vital Signs ED Triage Vitals [10/27/18 1042]  Enc Vitals Group     BP 124/84     Pulse Rate 74     Resp 18     Temp 98.6 F (37 C)     Temp Source Oral     SpO2 95 %     Weight      Height      Head Circumference      Peak Flow      Pain Score 10     Pain Loc  Pain Edu?      Excl. in GC?    No data found.  Updated Vital Signs BP 124/84 (BP Location: Left Arm)   Pulse 74   Temp 98.6 F (37 C) (Oral)   Resp 18   SpO2 95%   Visual Acuity Right Eye Distance:   Left Eye Distance:   Bilateral Distance:    Right Eye Near:   Left Eye Near:    Bilateral Near:     Physical Exam  Constitutional: She appears well-developed and well-nourished.  Very pleasant. Non toxic or ill appearing.   HENT:  Head: Normocephalic and atraumatic.  Eyes: Conjunctivae are normal.  Neck: Normal range of motion.  Pulmonary/Chest: Effort normal.  Musculoskeletal: She exhibits edema and tenderness. She exhibits no deformity.  Good flexion-extension of the left ankle.  Limited internal and external rotation.  Mild swelling to left lateral malleolus with tenderness to palpation.  No erythema, bruising, deformities.  Sensation and pedal pulse intact  Neurological: She is alert.  Skin: Skin is warm and dry. No rash noted. No erythema. No  pallor.  Psychiatric: She has a normal mood and affect.  Nursing note and vitals reviewed.    UC Treatments / Results  Labs (all labs ordered are listed, but only abnormal results are displayed) Labs Reviewed - No data to display  EKG None  Radiology Dg Ankle Complete Left  Result Date: 10/27/2018 CLINICAL DATA:  Trip and fall with ankle pain, initial encounter EXAM: LEFT ANKLE COMPLETE - 3+ VIEW COMPARISON:  None. FINDINGS: Generalized soft tissue swelling is noted about the ankle. No acute fracture or dislocation is noted. IMPRESSION: Soft tissue swelling without acute bony abnormality. Electronically Signed   By: Alcide Clever M.D.   On: 10/27/2018 11:14    Procedures Procedures (including critical care time)  Medications Ordered in UC Medications - No data to display  Initial Impression / Assessment and Plan / UC Course  I have reviewed the triage vital signs and the nursing notes.  Pertinent labs & imaging results that were available during my care of the patient were reviewed by me and considered in my medical decision making (see chart for details).    Ankle sprain-  X-ray negative for fracture just soft tissue swelling. Will apply ASO Rest, ice, elevate Ibuprofen for pain inflammation Follow up as needed for continued or worsening symptoms  Final Clinical Impressions(s) / UC Diagnoses   Final diagnoses:  Sprain of left ankle, unspecified ligament, initial encounter     Discharge Instructions     Your x-ray was negative for any fracture this is just a bad ankle sprain. We will do an ASO ankle brace Rest, elevate and ice the ankle You can take ibuprofen 600 mg every 6 hours for pain and inflammation Follow up as needed for continued or worsening symptoms     ED Prescriptions    None     Controlled Substance Prescriptions [place] Controlled Substance Registry consulted? Not Applicable   Janace Aris, NP 10/27/18 1125

## 2018-10-27 NOTE — ED Triage Notes (Signed)
Left ankle pain is getting worse than it was initially.

## 2018-11-21 ENCOUNTER — Other Ambulatory Visit: Payer: Self-pay

## 2018-11-21 ENCOUNTER — Emergency Department (HOSPITAL_COMMUNITY)
Admission: EM | Admit: 2018-11-21 | Discharge: 2018-11-21 | Disposition: A | Discharge status: Home / Self Care | Payer: Medicaid Other | Attending: Emergency Medicine | Admitting: Emergency Medicine

## 2018-11-21 ENCOUNTER — Encounter (HOSPITAL_COMMUNITY): Payer: Self-pay | Admitting: Emergency Medicine

## 2018-11-21 DIAGNOSIS — Z79899 Other long term (current) drug therapy: Secondary | ICD-10-CM | POA: Diagnosis not present

## 2018-11-21 DIAGNOSIS — F17210 Nicotine dependence, cigarettes, uncomplicated: Secondary | ICD-10-CM | POA: Insufficient documentation

## 2018-11-21 DIAGNOSIS — B353 Tinea pedis: Secondary | ICD-10-CM | POA: Diagnosis not present

## 2018-11-21 DIAGNOSIS — M79671 Pain in right foot: Secondary | ICD-10-CM | POA: Diagnosis present

## 2018-11-21 MED ORDER — CLOTRIMAZOLE 1 % EX CREA: TOPICAL_CREAM | CUTANEOUS | 0 refills | Status: DC

## 2018-11-21 NOTE — ED Triage Notes (Signed)
Pt reports irritation to R foot between great toe and 1st toe x 1 month. Pt reports pain to R foot.

## 2018-11-21 NOTE — Discharge Instructions (Addendum)
You have been seen today for right foot pain. Please read and follow all provided instructions.   1. Medications: Clotrimazole cream for foot pain, usual home medications 2. Treatment: rest, drink plenty of fluids, keep foot dry and avoid excess moisture.  3. Follow Up: Please follow up with your primary doctor in 2 days for discussion of your diagnoses and further evaluation after today's visit; if you do not have a primary care doctor use the resource guide provided to find one; Please return to the ER for any new or worsening symptoms. Please obtain all of your results from medical records or have your doctors office obtain the results - share them with your doctor - you should be seen at your doctors office. Call today to arrange your follow up.   Take medications as prescribed. Please review all of the medicines and only take them if you do not have an allergy to them. Return to the emergency room for worsening condition or new concerning symptoms. Follow up with your regular doctor. If you don't have a regular doctor use one of the numbers below to establish a primary care doctor.  Please be aware that if you are taking birth control pills, taking other prescriptions, ESPECIALLY ANTIBIOTICS may make the birth control ineffective - if this is the case, either do not engage in sexual activity or use alternative methods of birth control such as condoms until you have finished the medicine and your family doctor says it is OK to restart them. If you are on a blood thinner such as COUMADIN, be aware that any other medicine that you take may cause the coumadin to either work too much, or not enough - you should have your coumadin level rechecked in next 7 days if this is the case.  ?  It is also a possibility that you have an allergic reaction to any of the medicines that you have been prescribed - Everybody reacts differently to medications and while MOST people have no trouble with most medicines, you  may have a reaction such as nausea, vomiting, rash, swelling, shortness of breath. If this is the case, please stop taking the medicine immediately and contact your physician.  ?  You should return to the ER if you develop severe or worsening symptoms.   Emergency Department Resource Guide 1) Find a Doctor and Pay Out of Pocket Although you won't have to find out who is covered by your insurance plan, it is a good idea to ask around and get recommendations. You will then need to call the office and see if the doctor you have chosen will accept you as a new patient and what types of options they offer for patients who are self-pay. Some doctors offer discounts or will set up payment plans for their patients who do not have insurance, but you will need to ask so you aren't surprised when you get to your appointment.  2) Contact Your Local Health Department Not all health departments have doctors that can see patients for sick visits, but many do, so it is worth a call to see if yours does. If you don't know where your local health department is, you can check in your phone book. The CDC also has a tool to help you locate your state's health department, and many state websites also have listings of all of their local health departments.  3) Find a Walk-in Clinic If your illness is not likely to be very severe or complicated, you  may want to try a walk in clinic. These are popping up all over the country in pharmacies, drugstores, and shopping centers. They're usually staffed by nurse practitioners or physician assistants that have been trained to treat common illnesses and complaints. They're usually fairly quick and inexpensive. However, if you have serious medical issues or chronic medical problems, these are probably not your best option.  No Primary Care Doctor: Call Health Connect at  [phone] - they can help you locate a primary care doctor that  accepts your insurance, provides certain services,  etc. Physician Referral [phone]  Emergency Department Resource Guide 1) Find a Doctor and Pay Out of Pocket Although you won't have to find out who is covered by your insurance plan, it is a good idea to ask around and get recommendations. You will then need to call the office and see if the doctor you have chosen will accept you as a new patient and what types of options they offer for patients who are self-pay. Some doctors offer discounts or will set up payment plans for their patients who do not have insurance, but you will need to ask so you aren't surprised when you get to your appointment.  2) Contact Your Local Health Department Not all health departments have doctors that can see patients for sick visits, but many do, so it is worth a call to see if yours does. If you don't know where your local health department is, you can check in your phone book. The CDC also has a tool to help you locate your state's health department, and many state websites also have listings of all of their local health departments.  3) Find a Walk-in Clinic If your illness is not likely to be very severe or complicated, you may want to try a walk in clinic. These are popping up all over the country in pharmacies, drugstores, and shopping centers. They're usually staffed by nurse practitioners or physician assistants that have been trained to treat common illnesses and complaints. They're usually fairly quick and inexpensive. However, if you have serious medical issues or chronic medical problems, these are probably not your best option.  No Primary Care Doctor: Call Health Connect at  [phone] - they can help you locate a primary care doctor that  accepts your insurance, provides certain services, etc. Physician Referral Service- [phone]  Chronic Pain Problems: Organization         Address  Phone   Notes  [facility]  [phone] Patients need to be referred by their  primary care doctor.   Medication Assistance: Organization         Address  Phone   Notes  [facility] Medication [facility] [address] [place] [place], [place] [address] [phone] --Must be a resident of [facility] -- Must have NO insurance coverage whatsoever (no Medicaid/ Medicare, etc.) -- The pt. MUST have a primary care doctor that directs their care regularly and follows them in the community   [facility]  [phone]   [employer]  [phone]    Agencies that provide inexpensive medical care: Organization         Address  Phone   Notes  [facility]  [phone]   [facility]    [phone]   [facility] [address] [place], [place] [address] [phone]   [facility] [address] [place], [place] (  [phone]   [facility]anned Parenthood    [phone]   [facility]    [phone]   [facility] and [facility]  [address], [place] Phone:  [phone], Fax:  [phone] Hours of Operation:  9 am - 6 pm, M-F.  Also accepts Medicaid/Medicare and self-pay.  [facility]  [address], [place] Phone: [phone], Fax: [phone]. Hours of Operation:  8:30 am - 5:30 pm, M-F.  Also accepts Medicaid and self-pay.  [facility] [place] [address] [place] Phone: [phone]   [facility] [address] Natasha Bence [place], [place] [phone], Ext. [phone] Mondays & Thursdays: 7-9 AM.  First 15 patients are seen on a first come, first serve basis.    Medicaid-accepting [facility] Providers:  Organization         Address  Phone   Notes  [facility] [address], [place] [phone] Also accepts self-pay patients.  [facility] [address] Laurell Josephs [place], [place]  [phone]   [facility] [address], [place] [phone]   [facility] [address], [place] [phone]   Renaye Rakers [address], [place]   [phone] Only accepts [place] patients after they have their name applied to their card.   Self-Pay (no insurance) in [facility]:  Organization         Address  Phone   Notes  Sickle Cell Patients, [facility] [address] [place], [place] [phone]   [facility] [address] [place], [place] [phone]   [facility]  [address], [place] [phone]   [facility]/Dr. Osei-Bonsu  [address] [place] or [address], [place] [phone] Phone number for both [place] and [place] locations is the same.  [facility]gent [facility] and [facility] [address], [place] [phone]   [facility] [address] [place] or [address] [phone]   [facility] [address], [place] [phone], phone; [phone], fax Sees patients 1st and 3rd Saturday of every month.  Must not qualify for public or private insurance (i.e. Medicaid, Medicare, [place] Health Choice, Veterans' Benefits)  Household income should be no more than 200% of the poverty level The clinic cannot treat you if you are pregnant or think you are pregnant  Sexually transmitted diseases are not treated at the clinic.

## 2018-11-21 NOTE — ED Provider Notes (Signed)
[facility] EMERGENCY DEPARTMENT Provider Note   CSN: [id number] Arrival date & time: 11/21/18  2210     History   Chief Complaint Chief Complaint  Patient presents with  . Wound Check    HPI [name] is a 32 y.o. female presenting with constant nonradiating right foot pain onset 1 month ago. Patient describes she has a wound between her 1st and 2nd phalanges of her right foot. Patient describes pain as a burning and states it is itchy. Patient states she has tried peroxide and alcohol without relief. Patient denies trauma or history of diabetes. Patient denies fever, chills, night sweats, nausea, or abdominal pain.   HPI  Past Medical History:  Diagnosis Date  . Obesity     Patient Active Problem List   Diagnosis Date Noted  . Obesity, Class III, BMI 40-49.9 (morbid obesity) (HCC) 03/06/2017  . Tobacco use 03/06/2017    Past Surgical History:  Procedure Laterality Date  . BREAST BIOPSY Left 07/31/2013   Procedure: IRRIGATION AND DEBRIDMENT LEFT BREAST ABSCESS;  Surgeon: Lodema Pilot, DO;  Location: [facility];  Service: General;  Laterality: Left;  IRRIGATION AND DEBRIDEMENT LEFT BREAST ABSCESS  . CESAREAN SECTION       OB History   None      Home Medications    Prior to Admission medications   Medication Sig Start Date End Date Taking? Authorizing Provider  clotrimazole (LOTRIMIN) 1 % cream Apply to affected area 2 times daily 11/21/18   Carlyle Basques P, PA-C  ibuprofen (ADVIL,MOTRIN) 600 MG tablet Take 600 mg by mouth every 6 (six) hours as needed.    [provider]  meloxicam (MOBIC) 15 MG tablet Take 1 tablet (15 mg total) by mouth daily. 10/27/18   Wallis Bamberg, PA-C    Family History Family History  Problem Relation Age of Onset  . Diabetes Father     Social History Social History   Tobacco Use  . Smoking status: Current Every Day Smoker    Packs/day: 1.00    Types: Cigarettes  . Smokeless tobacco: Never Used    Substance Use Topics  . Alcohol use: No  . Drug use: Never     Allergies   Patient has no known allergies.   Review of Systems Review of Systems  Constitutional: Negative for chills, diaphoresis and fever.  Respiratory: Negative for cough and shortness of breath.   Cardiovascular: Negative for chest pain.  Gastrointestinal: Negative for abdominal pain, nausea and vomiting.  Skin: Positive for wound. Negative for rash.  Allergic/Immunologic: Negative for immunocompromised state.  Hematological: Negative for adenopathy.     Physical Exam Updated Vital Signs BP 130/78 (BP Location: Right Arm)   Pulse 97   Temp 98.1 F (36.7 C) (Oral)   Resp 18   LMP 10/24/2018   SpO2 98%   Physical Exam  Constitutional: She is oriented to person, place, and time. She appears well-developed and well-nourished. No distress.  HENT:  Head: Normocephalic and atraumatic.  Cardiovascular: Normal rate, regular rhythm and normal heart sounds. Exam reveals no gallop and no friction rub.  No murmur heard. Pulmonary/Chest: Effort normal and breath sounds normal. No respiratory distress. She has no wheezes. She has no rales.  Abdominal: Soft. She exhibits no distension. There is no tenderness.  Musculoskeletal: Normal range of motion.  Neurological: She is alert and oriented to person, place, and time.  Skin: Skin is warm. No rash noted. She is not diaphoretic. There is erythema.  Psychiatric: She has a normal mood and affect.  Nursing note and vitals reviewed.    ED Treatments / Results  Labs (all labs ordered are listed, but only abnormal results are displayed) Labs Reviewed - No data to display  EKG None  Radiology No results found.  Procedures Procedures (including critical care time)  Medications Ordered in ED Medications - No data to display   Initial Impression / Assessment and Plan / ED Course  I have reviewed the triage vital signs and the nursing notes.  Pertinent  labs & imaging results that were available during my care of the patient were reviewed by me and considered in my medical decision making (see chart for details).    Patient presents with complaint of right foot pain/wound check. Patient nontoxic appearing, in no apparent distress, vitals WNL, stable.   Suspect right foot pain/wound check is likely due to tenia pedis based on history and physical exam. Will prescribe Clotrimazole cream for symptoms. Doubt need for further emergent work up at this time. I discussed results, treatment plan, need for PCP follow-up, and return precautions to return to the ER including for any other new or worsening symptoms with the patient. Provided opportunity for questions, patient confirmed understanding and is in agreement with plan. I have answered their questions. Discharge instructions concerning home care and prescriptions have been given. The patient is STABLE and is discharged to home in good condition. Encouraged patient to follow up with PCP and have PCP obtain results of this visit in 2 or sooner if needed.    Final Clinical Impressions(s) / ED Diagnoses   Final diagnoses:  Tinea pedis of right foot    ED Discharge Orders         Ordered    clotrimazole (LOTRIMIN) 1 % cream     11/21/18 2316           Leretha DykesHernandez, Antavia Tandy P, [place]-C 11/21/18 2325    Raeford RazorKohut, Stephen, MD 11/22/18 1556

## 2018-11-30 ENCOUNTER — Ambulatory Visit (HOSPITAL_COMMUNITY)
Admission: EM | Admit: 2018-11-30 | Discharge: 2018-11-30 | Disposition: A | Discharge status: Home / Self Care | Payer: Medicaid Other | Attending: Internal Medicine | Admitting: Internal Medicine

## 2018-11-30 ENCOUNTER — Ambulatory Visit (INDEPENDENT_AMBULATORY_CARE_PROVIDER_SITE_OTHER): Payer: Medicaid Other

## 2018-11-30 ENCOUNTER — Encounter (HOSPITAL_COMMUNITY): Payer: Self-pay | Admitting: *Deleted

## 2018-11-30 DIAGNOSIS — M79672 Pain in left foot: Secondary | ICD-10-CM

## 2018-11-30 DIAGNOSIS — M25572 Pain in left ankle and joints of left foot: Secondary | ICD-10-CM

## 2018-11-30 MED ORDER — IBUPROFEN 800 MG PO TABS
ORAL_TABLET | ORAL | Status: AC
  Filled 2018-11-30: qty 1

## 2018-11-30 MED ORDER — HYDROCODONE-ACETAMINOPHEN 5-325 MG PO TABS: 1.0000 | ORAL_TABLET | Freq: Four times a day (QID) | ORAL | 0 refills | Status: DC | PRN

## 2018-11-30 MED ORDER — DICLOFENAC SODIUM 75 MG PO TBEC: 75.0000 mg | DELAYED_RELEASE_TABLET | Freq: Two times a day (BID) | ORAL | 0 refills | Status: DC

## 2018-11-30 MED ORDER — IBUPROFEN 800 MG PO TABS
800.0000 mg | ORAL_TABLET | Freq: Once | ORAL | Status: AC
  Administered 2018-11-30: 800 mg via ORAL

## 2018-11-30 NOTE — ED Provider Notes (Signed)
[facility]    CSN: [id number] Arrival date & time: 11/30/18  1206     History   Chief Complaint Chief Complaint  Patient presents with  . Ankle Injury    HPI [name] is a 32 y.o. female.   32 year old female presents with injury to her left ankle. Her left leg "gave out" about 2 hours ago and she fell and twisted her left ankle. She is experiencing immediate severe pain. She is unable to bear weight. She was seen here on 10/27/18 for sprain to the same ankle and her left ankle had not improved since that visit. Indicates ankle is still swollen. She had been resting her foot and taking Mobic with no relief. No other chronic health issues. Takes no daily medication.   The history is provided by the patient.  Ankle Injury  This is a recurrent problem. The current episode started 1 to 2 hours ago. The problem occurs constantly. The problem has been gradually worsening. Pertinent negatives include no chest pain, no abdominal pain, no headaches and no shortness of breath. The symptoms are aggravated by twisting, walking and standing. Nothing relieves the symptoms. She has tried a cold compress and rest for the symptoms. The treatment provided no relief.    Past Medical History:  Diagnosis Date  . Obesity     Patient Active Problem List   Diagnosis Date Noted  . Obesity, Class III, BMI 40-49.9 (morbid obesity) (HCC) 03/06/2017  . Tobacco use 03/06/2017    Past Surgical History:  Procedure Laterality Date  . BREAST BIOPSY Left 07/31/2013   Procedure: IRRIGATION AND DEBRIDMENT LEFT BREAST ABSCESS;  Surgeon: Lodema Pilot, DO;  Location: [facility];  Service: General;  Laterality: Left;  IRRIGATION AND DEBRIDEMENT LEFT BREAST ABSCESS  . CESAREAN SECTION      OB History   No obstetric history on file.      Home Medications    Prior to Admission medications   Medication Sig Start Date End Date Taking? Authorizing Provider  diclofenac (VOLTAREN) 75 MG EC  tablet Take 1 tablet (75 mg total) by mouth 2 (two) times daily. 11/30/18   Sudie Grumbling, NP  HYDROcodone-acetaminophen (NORCO/VICODIN) 5-325 MG tablet Take 1-2 tablets by mouth every 6 (six) hours as needed for severe pain. 11/30/18   Sudie Grumbling, NP    Family History Family History  Problem Relation Age of Onset  . Diabetes Father     Social History Social History   Tobacco Use  . Smoking status: Current Every Day Smoker    Packs/day: 1.00    Types: Cigarettes  . Smokeless tobacco: Never Used  Substance Use Topics  . Alcohol use: No  . Drug use: Never     Allergies   Patient has no known allergies.   Review of Systems Review of Systems  Constitutional: Positive for activity change. Negative for appetite change, chills, fatigue and fever.  Respiratory: Negative for cough, chest tightness and shortness of breath.   Cardiovascular: Negative for chest pain.  Gastrointestinal: Negative for abdominal pain, nausea and vomiting.  Musculoskeletal: Positive for arthralgias, gait problem, joint swelling and myalgias.  Skin: Negative for color change, rash and wound.  Neurological: Negative for dizziness, tremors, seizures, syncope, weakness, light-headedness, numbness and headaches.  Hematological: Negative for adenopathy. Does not bruise/bleed easily.     Physical Exam Triage Vital Signs ED Triage Vitals  Enc Vitals Group     BP 11/30/18 1242 114/70  Pulse Rate 11/30/18 1240 81     Resp 11/30/18 1240 18     Temp 11/30/18 1240 97.9 F (36.6 C)     Temp Source 11/30/18 1240 Oral     SpO2 11/30/18 1240 100 %     Weight --      Height --      Head Circumference --      Peak Flow --      Pain Score 11/30/18 1241 10     Pain Loc --      Pain Edu? --      Excl. in GC? --    No data found.  Updated Vital Signs BP 114/70   Pulse 81   Temp 97.9 F (36.6 C) (Oral)   Resp 18   LMP 11/16/2018 (Approximate)   SpO2 100%   Visual Acuity Right Eye  Distance:   Left Eye Distance:   Bilateral Distance:    Right Eye Near:   Left Eye Near:    Bilateral Near:     Physical Exam Vitals signs and nursing note reviewed.  Constitutional:      General: She is not in acute distress.    Appearance: She is well-developed and well-groomed. She is obese.     Comments: Patient sitting in wheel chair in no acute distress but appears in pain.   HENT:     Head: Normocephalic and atraumatic.  Eyes:     Extraocular Movements: Extraocular movements intact.     Conjunctiva/sclera: Conjunctivae normal.  Neck:     Musculoskeletal: Normal range of motion.  Cardiovascular:     Rate and Rhythm: Normal rate.  Pulmonary:     Effort: Pulmonary effort is normal.  Musculoskeletal:        General: Swelling, tenderness and signs of injury present.     Left ankle: She exhibits decreased range of motion and swelling. She exhibits no ecchymosis and normal pulse. Tenderness. Lateral malleolus and medial malleolus tenderness found. Achilles tendon normal.     Right lower leg: No edema.     Left lower leg: No edema.       Feet:     Comments: Decreased range of motion of left ankle- limited movement in all directions. Very tender around medial malleolus as well as lateral malleolus. Patient crying when I examined these areas. Also swelling noted in dorsal aspect of foot and in ankle. No distinct bruising or color change noted. Good distal pulses and capillary refill. No distinct neuro deficits noted.   Skin:    General: Skin is warm and dry.     Capillary Refill: Capillary refill takes less than 2 seconds.     Findings: No bruising, ecchymosis, erythema, rash or wound.  Neurological:     Mental Status: She is alert and oriented to person, place, and time.     Sensory: Sensation is intact. No sensory deficit.     Motor: Motor function is intact.  Psychiatric:        Attention and Perception: She is inattentive.        Mood and Affect: Affect is angry and  tearful.        Speech: Speech normal.        Behavior: Behavior is cooperative.        Thought Content: Thought content normal.     Comments: Patient and her other family member was talking and watching ?videos on the phone during the entire exam      UC Treatments /  Results  Labs (all labs ordered are listed, but only abnormal results are displayed) Labs Reviewed - No data to display  EKG None  Radiology Dg Ankle Complete Left  Result Date: 11/30/2018 CLINICAL DATA:  Left ankle pain and swelling after injury 2 weeks ago. EXAM: LEFT ANKLE COMPLETE - 3+ VIEW COMPARISON:  Radiographs of October 27, 2018. FINDINGS: There is no evidence of fracture, dislocation, or joint effusion. There is no evidence of arthropathy or other focal bone abnormality. Soft tissue swelling is seen over lateral malleolus. IMPRESSION: No fracture or dislocation is noted. Soft tissue swelling seen over lateral malleolus. Electronically Signed   By: Lupita Raider, M.D.   On: 11/30/2018 14:14   Dg Foot Complete Left  Result Date: 11/30/2018 CLINICAL DATA:  Acute left foot pain after fall this morning. EXAM: LEFT FOOT - COMPLETE 3+ VIEW COMPARISON:  None. FINDINGS: There is no evidence of fracture or dislocation. There is no evidence of arthropathy or other focal bone abnormality. Soft tissues are unremarkable. IMPRESSION: Negative. Electronically Signed   By: Lupita Raider, M.D.   On: 11/30/2018 14:15    Procedures Procedures (including critical care time)  Medications Ordered in UC Medications  ibuprofen (ADVIL,MOTRIN) tablet 800 mg (800 mg Oral Given 11/30/18 1354)    Initial Impression / Assessment and Plan / UC Course  I have reviewed the triage vital signs and the nursing notes.  Pertinent labs & imaging results that were available during my care of the patient were reviewed by me and considered in my medical decision making (see chart for details).    Gave Ibuprofen 800mg  now to help with  pain. Offered Toradol IM but patient preferred oral medication. Will trial Voltaren 75mg  twice a day as directed for pain. May take Vicodin 1 to 2 tablets every 6 hours as needed for severe pain. Keep foot elevated as much as possible. Applied ankle air cast for better support. Note written for work. Call Orthopedic tomorrow AM to schedule appointment for further evaluation.  Final Clinical Impressions(s) / UC Diagnoses   Final diagnoses:  Acute left ankle pain  Left foot pain     Discharge Instructions     Recommend take Voltaren 75mg  twice a day as directed for pain and swelling. May use Vicodin 1 to 2 tablets every 6 hours as needed for severe pain. Rest. Keep foot elevated as much as possible. Wear ace wrap and air cast for support. Follow-up with the Orthopedic as soon as possible for further evaluation.     ED Prescriptions    Medication Sig Dispense Auth. Provider   diclofenac (VOLTAREN) 75 MG EC tablet Take 1 tablet (75 mg total) by mouth 2 (two) times daily. 30 tablet Sudie Grumbling, NP   HYDROcodone-acetaminophen (NORCO/VICODIN) 5-325 MG tablet Take 1-2 tablets by mouth every 6 (six) hours as needed for severe pain. 12 tablet Sudie Grumbling, NP     Controlled Substance Prescriptions [place] Controlled Substance Registry consulted? Yes, I have consulted the [place] Controlled Substances Registry for this patient, and feel the risk/benefit ratio today is favorable for proceeding with this prescription for a controlled substance. Last active Rx was for Tylenol #3 (8 pills) in September. Also had Oxycodone-Acetaminophen (22 pills) in September but none since. I feel the benefit outweighs the risk in providing 12 tablets of a controlled substance for pain today.    Sudie Grumbling, NP 12/01/18 [phone]

## 2018-11-30 NOTE — Discharge Instructions (Addendum)
Recommend take Voltaren 75mg  twice a day as directed for pain and swelling. May use Vicodin 1 to 2 tablets every 6 hours as needed for severe pain. Rest. Keep foot elevated as much as possible. Wear ace wrap and air cast for support. Follow-up with the Orthopedic as soon as possible for further evaluation.

## 2018-11-30 NOTE — ED Triage Notes (Addendum)
Reports "my leg gave out on me again", rolling left ankle just PTA.  States same ankle she sprained approx 3 wks ago.  C/O inability to bear weight on LLE. Left ankle and distal left foot both tender to palpation.

## 2018-12-05 ENCOUNTER — Encounter (HOSPITAL_COMMUNITY): Payer: Self-pay

## 2018-12-05 ENCOUNTER — Emergency Department (HOSPITAL_COMMUNITY)
Admission: EM | Admit: 2018-12-05 | Discharge: 2018-12-05 | Disposition: A | Discharge status: Home / Self Care | Payer: Medicaid Other | Attending: Emergency Medicine | Admitting: Emergency Medicine

## 2018-12-05 DIAGNOSIS — F17210 Nicotine dependence, cigarettes, uncomplicated: Secondary | ICD-10-CM | POA: Diagnosis not present

## 2018-12-05 DIAGNOSIS — R05 Cough: Secondary | ICD-10-CM | POA: Diagnosis not present

## 2018-12-05 DIAGNOSIS — R059 Cough, unspecified: Secondary | ICD-10-CM

## 2018-12-05 MED ORDER — AZITHROMYCIN 250 MG PO TABS: 250.0000 mg | ORAL_TABLET | Freq: Every day | ORAL | 0 refills | Status: DC

## 2018-12-05 MED ORDER — GUAIFENESIN-CODEINE 100-10 MG/5ML PO SOLN: 5.0000 mL | Freq: Four times a day (QID) | ORAL | 0 refills | Status: DC | PRN

## 2018-12-05 MED ORDER — BENZONATATE 100 MG PO CAPS: 200.0000 mg | ORAL_CAPSULE | Freq: Two times a day (BID) | ORAL | 0 refills | Status: DC | PRN

## 2018-12-05 NOTE — ED Triage Notes (Signed)
Pt has been having a cough since 4  Days ago. Pt she has been spitting up phlegm and has been feeling SOB.

## 2018-12-05 NOTE — ED Notes (Signed)
PT states understanding of care given, follow up care, and medication prescribed. PT ambulated from ED to car with a steady gait. 

## 2018-12-05 NOTE — ED Provider Notes (Signed)
[facility]   CSN: [id number] Arrival date & time: 12/05/18  0411     History   Chief Complaint Chief Complaint  Patient presents with  . flu symptoms    HPI [name] is a 32 y.o. female.  Patient presents to the emergency department with a chief complaint of cough and cold symptoms.  She reports being sick for the past 4 to 5 days.  Reports cough is productive with yellow sputum.  She has tried taking OTC cough and cold medicines with no relief.  Denies any body aches.    The history is provided by the patient. No language interpreter was used.    Past Medical History:  Diagnosis Date  . Obesity     Patient Active Problem List   Diagnosis Date Noted  . Obesity, Class III, BMI 40-49.9 (morbid obesity) (HCC) 03/06/2017  . Tobacco use 03/06/2017    Past Surgical History:  Procedure Laterality Date  . BREAST BIOPSY Left 07/31/2013   Procedure: IRRIGATION AND DEBRIDMENT LEFT BREAST ABSCESS;  Surgeon: Lodema PilotBrian Layton, DO;  Location: [facility];  Service: General;  Laterality: Left;  IRRIGATION AND DEBRIDEMENT LEFT BREAST ABSCESS  . CESAREAN SECTION       OB History   No obstetric history on file.      Home Medications    Prior to Admission medications   Medication Sig Start Date End Date Taking? Authorizing Provider  diclofenac (VOLTAREN) 75 MG EC tablet Take 1 tablet (75 mg total) by mouth 2 (two) times daily. 11/30/18   Sudie GrumblingAmyot, Ann Berry, NP  HYDROcodone-acetaminophen (NORCO/VICODIN) 5-325 MG tablet Take 1-2 tablets by mouth every 6 (six) hours as needed for severe pain. 11/30/18   Sudie GrumblingAmyot, Ann Berry, NP    Family History Family History  Problem Relation Age of Onset  . Diabetes Father     Social History Social History   Tobacco Use  . Smoking status: Current Every Day Smoker    Packs/day: 1.00    Types: Cigarettes  . Smokeless tobacco: Never Used  Substance Use Topics  . Alcohol use: No  . Drug  use: Never     Allergies   Patient has no known allergies.   Review of Systems Review of Systems  All other systems reviewed and are negative.    Physical Exam Updated Vital Signs BP (!) 142/97   Pulse 80   Temp 98.4 F (36.9 C)   Resp 18   Ht 5\' 3"  (1.6 m)   Wt 104.3 kg   LMP 11/16/2018 (Approximate)   SpO2 99%   BMI 40.74 kg/m   Physical Exam Vitals signs and nursing note reviewed.  Constitutional:      Appearance: She is well-developed.  HENT:     Head: Normocephalic and atraumatic.  Eyes:     Conjunctiva/sclera: Conjunctivae normal.     Pupils: Pupils are equal, round, and reactive to light.  Neck:     Musculoskeletal: Normal range of motion and neck supple.  Cardiovascular:     Rate and Rhythm: Normal rate and regular rhythm.     Heart sounds: No murmur. No friction rub. No gallop.   Pulmonary:     Effort: Pulmonary effort is normal. No respiratory distress.     Breath sounds: Normal breath sounds. No wheezing or rales.  Chest:     Chest wall: No tenderness.  Abdominal:     General: Bowel sounds are normal. There is no distension.  Palpations: Abdomen is soft. There is no mass.     Tenderness: There is no abdominal tenderness. There is no guarding or rebound.  Musculoskeletal: Normal range of motion.        General: No tenderness.  Skin:    General: Skin is warm and dry.  Neurological:     Mental Status: She is alert and oriented to person, place, and time.  Psychiatric:        Behavior: Behavior normal.        Thought Content: Thought content normal.        Judgment: Judgment normal.      ED Treatments / Results  Labs (all labs ordered are listed, but only abnormal results are displayed) Labs Reviewed - No data to display  EKG None  Radiology No results found.  Procedures Procedures (including critical care time)  Medications Ordered in ED Medications - No data to display   Initial Impression / Assessment and Plan / ED Course   I have reviewed the triage vital signs and the nursing notes.  Pertinent labs & imaging results that were available during my care of the patient were reviewed by me and considered in my medical decision making (see chart for details).     Patient with cough x4 days.  Lung sounds are clear.  Vital signs are stable.  Patient in no acute distress.  Discharged home with cough medicine and recommend PCP follow-up.  Final Clinical Impressions(s) / ED Diagnoses   Final diagnoses:  Cough    ED Discharge Orders         Ordered    guaiFENesin-codeine 100-10 MG/5ML syrup  Every 6 hours PRN     12/05/18 0443    benzonatate (TESSALON) 100 MG capsule  2 times daily PRN     12/05/18 0443    azithromycin (ZITHROMAX) 250 MG tablet  Daily     12/05/18 0443           Roxy HorsemanBrowning, Nethra Mehlberg, PA-C 12/05/18 0444    Dione BoozeGlick, David, MD 12/05/18 [phone]

## 2018-12-06 ENCOUNTER — Emergency Department (HOSPITAL_COMMUNITY)
Admission: EM | Admit: 2018-12-06 | Discharge: 2018-12-06 | Disposition: A | Discharge status: Home / Self Care | Payer: Medicaid Other | Attending: Emergency Medicine | Admitting: Emergency Medicine

## 2018-12-06 ENCOUNTER — Emergency Department (HOSPITAL_COMMUNITY): Payer: Medicaid Other

## 2018-12-06 ENCOUNTER — Encounter (HOSPITAL_COMMUNITY): Payer: Self-pay

## 2018-12-06 DIAGNOSIS — Z203 Contact with and (suspected) exposure to rabies: Secondary | ICD-10-CM | POA: Diagnosis not present

## 2018-12-06 DIAGNOSIS — S41131A Puncture wound without foreign body of right upper arm, initial encounter: Secondary | ICD-10-CM | POA: Insufficient documentation

## 2018-12-06 DIAGNOSIS — Y92813 Airplane as the place of occurrence of the external cause: Secondary | ICD-10-CM | POA: Insufficient documentation

## 2018-12-06 DIAGNOSIS — Y999 Unspecified external cause status: Secondary | ICD-10-CM | POA: Insufficient documentation

## 2018-12-06 DIAGNOSIS — Z2914 Encounter for prophylactic rabies immune globin: Secondary | ICD-10-CM | POA: Insufficient documentation

## 2018-12-06 DIAGNOSIS — Z23 Encounter for immunization: Secondary | ICD-10-CM | POA: Insufficient documentation

## 2018-12-06 DIAGNOSIS — F17210 Nicotine dependence, cigarettes, uncomplicated: Secondary | ICD-10-CM | POA: Diagnosis not present

## 2018-12-06 DIAGNOSIS — W540XXA Bitten by dog, initial encounter: Secondary | ICD-10-CM | POA: Diagnosis not present

## 2018-12-06 DIAGNOSIS — S4991XA Unspecified injury of right shoulder and upper arm, initial encounter: Secondary | ICD-10-CM | POA: Diagnosis present

## 2018-12-06 DIAGNOSIS — Y9389 Activity, other specified: Secondary | ICD-10-CM | POA: Diagnosis not present

## 2018-12-06 MED ORDER — OXYCODONE-ACETAMINOPHEN 5-325 MG PO TABS: 1.0000 | ORAL_TABLET | Freq: Four times a day (QID) | ORAL | 0 refills | Status: DC | PRN

## 2018-12-06 MED ORDER — RABIES IMMUNE GLOBULIN 150 UNIT/ML IM INJ
20.0000 [IU]/kg | INJECTION | Freq: Once | INTRAMUSCULAR | Status: AC
  Administered 2018-12-06: 2100 [IU] via INTRAMUSCULAR
  Filled 2018-12-06 (×2): qty 14

## 2018-12-06 MED ORDER — TETANUS-DIPHTH-ACELL PERTUSSIS 5-2.5-18.5 LF-MCG/0.5 IM SUSP
0.5000 mL | Freq: Once | INTRAMUSCULAR | Status: AC
  Administered 2018-12-06: 0.5 mL via INTRAMUSCULAR
  Filled 2018-12-06: qty 0.5

## 2018-12-06 MED ORDER — OXYCODONE-ACETAMINOPHEN 5-325 MG PO TABS
1.0000 | ORAL_TABLET | Freq: Once | ORAL | Status: AC
  Administered 2018-12-06: 1 via ORAL
  Filled 2018-12-06: qty 1

## 2018-12-06 MED ORDER — AMOXICILLIN-POT CLAVULANATE 875-125 MG PO TABS
1.0000 | ORAL_TABLET | Freq: Once | ORAL | Status: AC
  Administered 2018-12-06: 1 via ORAL
  Filled 2018-12-06: qty 1

## 2018-12-06 MED ORDER — RABIES VACCINE, PCEC IM SUSR
1.0000 mL | Freq: Once | INTRAMUSCULAR | Status: AC
  Administered 2018-12-06: 1 mL via INTRAMUSCULAR
  Filled 2018-12-06: qty 1

## 2018-12-06 MED ORDER — IBUPROFEN 600 MG PO TABS: 600.0000 mg | ORAL_TABLET | Freq: Four times a day (QID) | ORAL | 0 refills | Status: DC | PRN

## 2018-12-06 MED ORDER — AMOXICILLIN-POT CLAVULANATE 875-125 MG PO TABS: 1.0000 | ORAL_TABLET | Freq: Two times a day (BID) | ORAL | 0 refills | Status: DC

## 2018-12-06 MED ORDER — LIDOCAINE HCL (PF) 1 % IJ SOLN
5.0000 mL | Freq: Once | INTRAMUSCULAR | Status: AC
  Administered 2018-12-06: 5 mL
  Filled 2018-12-06: qty 5

## 2018-12-06 NOTE — ED Provider Notes (Signed)
[facility] Provider Note   CSN: [id number] Arrival date & time: 12/06/18  1227     History   Chief Complaint Chief Complaint  Patient presents with  . Animal Bite    HPI [name] is a 32 y.o. female with history of obesity who presents with dog bite to her right arm.  She reports she was bit by her own dog on the airplane.  She is unsure if the dog is up-to-date on vaccinations.  The dog recently had contact with a raccoon.  The patient is not up-to-date on tetanus.  She reports pain to the medial aspect of the right upper arm.  Bleeding controlled prior to arrival.  Incident occurred about 20 minutes prior to arrival.  Animal control not contacted.  HPI  Past Medical History:  Diagnosis Date  . Obesity     Patient Active Problem List   Diagnosis Date Noted  . Obesity, Class III, BMI 40-49.9 (morbid obesity) (HCC) 03/06/2017  . Tobacco use 03/06/2017    Past Surgical History:  Procedure Laterality Date  . BREAST BIOPSY Left 07/31/2013   Procedure: IRRIGATION AND DEBRIDMENT LEFT BREAST ABSCESS;  Surgeon: Lodema PilotBrian Layton, DO;  Location: [facility];  Service: General;  Laterality: Left;  IRRIGATION AND DEBRIDEMENT LEFT BREAST ABSCESS  . CESAREAN SECTION       OB History   No obstetric history on file.      Home Medications    Prior to Admission medications   Medication Sig Start Date End Date Taking? Authorizing Provider  amoxicillin-clavulanate (AUGMENTIN) 875-125 MG tablet Take 1 tablet by mouth every 12 (twelve) hours. 12/06/18   Quy Lotts, Waylan BogaAlexandra M, PA-C  azithromycin (ZITHROMAX) 250 MG tablet Take 1 tablet (250 mg total) by mouth daily. Take first 2 tablets together, then 1 every day until finished. 12/05/18   Roxy HorsemanBrowning, Robert, PA-C  benzonatate (TESSALON) 100 MG capsule Take 2 capsules (200 mg total) by mouth 2 (two) times daily as needed for cough. 12/05/18   Roxy HorsemanBrowning, Robert, PA-C  diclofenac (VOLTAREN) 75 MG EC tablet  Take 1 tablet (75 mg total) by mouth 2 (two) times daily. 11/30/18   Sudie GrumblingAmyot, Ann Berry, NP  guaiFENesin-codeine 100-10 MG/5ML syrup Take 5 mLs by mouth every 6 (six) hours as needed for cough. 12/05/18   Roxy HorsemanBrowning, Robert, PA-C  HYDROcodone-acetaminophen (NORCO/VICODIN) 5-325 MG tablet Take 1-2 tablets by mouth every 6 (six) hours as needed for severe pain. 11/30/18   Sudie GrumblingAmyot, Ann Berry, NP  ibuprofen (ADVIL,MOTRIN) 600 MG tablet Take 1 tablet (600 mg total) by mouth every 6 (six) hours as needed. 12/06/18   Emi HolesLaw, Stefania Goulart M, PA-C  oxyCODONE-acetaminophen (PERCOCET/ROXICET) 5-325 MG tablet Take 1 tablet by mouth every 6 (six) hours as needed for severe pain. 12/06/18   Emi HolesLaw, Micala Saltsman M, PA-C    Family History Family History  Problem Relation Age of Onset  . Diabetes Father     Social History Social History   Tobacco Use  . Smoking status: Current Every Day Smoker    Packs/day: 1.00    Types: Cigarettes  . Smokeless tobacco: Never Used  Substance Use Topics  . Alcohol use: No  . Drug use: Never     Allergies   Patient has no known allergies.   Review of Systems Review of Systems  Constitutional: Negative for fever.  Skin: Positive for wound.  Neurological: Negative for numbness.     Physical Exam Updated Vital Signs BP 117/83 (BP Location: Left Arm)  Pulse 72   Temp 98 F (36.7 C) (Oral)   Resp 16   LMP 11/16/2018 (Approximate)   SpO2 99%   Physical Exam Vitals signs and nursing note reviewed.  Constitutional:      General: She is not in acute distress.    Appearance: She is well-developed. She is not diaphoretic.  HENT:     Head: Normocephalic and atraumatic.     Mouth/Throat:     Pharynx: No oropharyngeal exudate.  Eyes:     General: No scleral icterus.       Right eye: No discharge.        Left eye: No discharge.     Conjunctiva/sclera: Conjunctivae normal.     Pupils: Pupils are equal, round, and reactive to light.  Neck:     Musculoskeletal: Normal  range of motion and neck supple.     Thyroid: No thyromegaly.  Cardiovascular:     Rate and Rhythm: Normal rate and regular rhythm.     Heart sounds: Normal heart sounds. No murmur. No friction rub. No gallop.   Pulmonary:     Effort: Pulmonary effort is normal. No respiratory distress.     Breath sounds: Normal breath sounds. No stridor. No wheezing or rales.  Abdominal:     General: Bowel sounds are normal. There is no distension.     Palpations: Abdomen is soft.     Tenderness: There is no abdominal tenderness. There is no guarding or rebound.  Musculoskeletal:     Comments: 5 small teeth mark wounds with surrounding tenderness and ecchymosis noted to the medial aspect of the right upper arm, no active bleeding; one wound has minimal amount of adipose coming out of the wound; some tenderness tracking back toward the lateral aspect of the upper arm  Lymphadenopathy:     Cervical: No cervical adenopathy.  Skin:    General: Skin is warm and dry.     Coloration: Skin is not pale.     Findings: No rash.  Neurological:     Mental Status: She is alert.     Coordination: Coordination normal.        ED Treatments / Results  Labs (all labs ordered are listed, but only abnormal results are displayed) Labs Reviewed  POC URINE PREG, ED    EKG None  Radiology Dg Humerus Right  Result Date: 12/06/2018 CLINICAL DATA:  Dog bite to right upper arm. Pain. Initial encounter. EXAM: RIGHT HUMERUS - 2+ VIEW COMPARISON:  None. FINDINGS: There is no evidence of fracture or other focal bone lesions. Soft tissues are unremarkable. No evidence of radiopaque foreign body or soft tissue gas. IMPRESSION: Negative. Electronically Signed   By: Myles RosenthalJohn  Stahl M.D.   On: 12/06/2018 13:49    Procedures Procedures (including critical care time)  Medications Ordered in ED Medications  rabies immune globulin (HYPERAB/KEDRAB) injection 2,100 Units (2,100 Units Intramuscular Given 12/06/18 1356)  rabies  vaccine (RABAVERT) injection 1 mL (1 mL Intramuscular Given 12/06/18 1350)  Tdap (BOOSTRIX) injection 0.5 mL (0.5 mLs Intramuscular Given 12/06/18 1357)  amoxicillin-clavulanate (AUGMENTIN) 875-125 MG per tablet 1 tablet (1 tablet Oral Given 12/06/18 1400)  lidocaine (PF) (XYLOCAINE) 1 % injection 5 mL (5 mLs Infiltration Given by Other 12/06/18 1353)  oxyCODONE-acetaminophen (PERCOCET/ROXICET) 5-325 MG per tablet 1 tablet (1 tablet Oral Given 12/06/18 1426)     Initial Impression / Assessment and Plan / ED Course  I have reviewed the triage vital signs and the nursing notes.  Pertinent labs &  imaging results that were available during my care of the patient were reviewed by me and considered in my medical decision making (see chart for details).     Patient with dog bite to right upper arm.  X-rays negative.  The dog's rabies vaccine not up-to-date and there is concern for recent exposure to a raccoon.  I discussed that the family could speak with animal control and quarantine the dog, however they would rather just receive the rabies vaccine.  Tetanus not up-to-date, but updated in the ED.  Wound irrigated.  I offered closure to the larger wound with adipose, however patient declined.  Will discharge home with Augmentin.  Patient given rabies schedule.  Strict return precautions given for infection.  Patient vies to take ibuprofen for pain control and given short course of Percocet for breakthrough pain.  I reviewed the [place] narcotic database and found no discrepancies.  Wound care discussed.  Patient understands and agrees with plan.  Patient vital stable throughout ED course and discharged in satisfactory condition.  Final Clinical Impressions(s) / ED Diagnoses   Final diagnoses:  Dog bite, initial encounter    ED Discharge Orders         Ordered    amoxicillin-clavulanate (AUGMENTIN) 875-125 MG tablet  Every 12 hours     12/06/18 1444    oxyCODONE-acetaminophen (PERCOCET/ROXICET) 5-325  MG tablet  Every 6 hours PRN     12/06/18 1444    ibuprofen (ADVIL,MOTRIN) 600 MG tablet  Every 6 hours PRN     12/06/18 1444           Ethelda Deangelo, [place], PA-C 12/06/18 1516    Doug Sou, MD 12/06/18 [phone]

## 2018-12-06 NOTE — ED Triage Notes (Signed)
Pt was bitten by household pitbull, unsure of shot record, pt was just playing with dog but the dog plays pretty rough.

## 2018-12-06 NOTE — ED Notes (Signed)
Pt stable, ambulatory, states understanding of discharge instructions 

## 2018-12-06 NOTE — Discharge Instructions (Signed)
Take Augmentin until completed.  You can take ibuprofen every 6 hours as needed for your pain.  For breakthrough pain, take 1 Percocet every 6 hours as needed.  Wash your wounds daily and apply clean dressing.  Use ice 3-4 times daily alternating 20 minutes on, 20 minutes off.  Keep your arm elevated.  Please follow-up as scheduled for your rabies vaccinations.  At that time, make sure to have your wounds evaluated.  Please return to the emergency department if you develop any increasing pain, redness, swelling, drainage, red streaking from the area.

## 2018-12-22 ENCOUNTER — Encounter (HOSPITAL_COMMUNITY): Payer: Self-pay | Admitting: Emergency Medicine

## 2018-12-22 ENCOUNTER — Other Ambulatory Visit: Payer: Self-pay

## 2018-12-22 ENCOUNTER — Ambulatory Visit (HOSPITAL_COMMUNITY)
Admission: EM | Admit: 2018-12-22 | Discharge: 2018-12-22 | Disposition: A | Discharge status: Home / Self Care | Payer: Medicaid Other | Attending: Family Medicine | Admitting: Family Medicine

## 2018-12-22 DIAGNOSIS — M25561 Pain in right knee: Secondary | ICD-10-CM | POA: Insufficient documentation

## 2018-12-22 DIAGNOSIS — R062 Wheezing: Secondary | ICD-10-CM | POA: Diagnosis not present

## 2018-12-22 DIAGNOSIS — M25562 Pain in left knee: Secondary | ICD-10-CM | POA: Insufficient documentation

## 2018-12-22 DIAGNOSIS — M25572 Pain in left ankle and joints of left foot: Secondary | ICD-10-CM | POA: Diagnosis not present

## 2018-12-22 MED ORDER — ALBUTEROL SULFATE HFA 108 (90 BASE) MCG/ACT IN AERS: 1.0000 | INHALATION_SPRAY | Freq: Four times a day (QID) | RESPIRATORY_TRACT | 0 refills | Status: DC | PRN

## 2018-12-22 NOTE — Discharge Instructions (Signed)
Please try the exercises. These are going to help with your stability.   Please try ice if needed for pain  Please try heat on the back  Please try the inhaler.  Please follow up if your symptoms fail to improve.

## 2018-12-22 NOTE — ED Triage Notes (Signed)
Back and left leg pain that started on Friday.

## 2018-12-22 NOTE — ED Provider Notes (Signed)
[facility]    CSN: [id number] Arrival date & time: 12/22/18  1858     History   Chief Complaint Chief Complaint  Patient presents with  . Back Pain    HPI [name] is a 33 y.o. female.   She is presenting with left ankle pain, bilateral knee pain and wheezing.  She reports having an ankle injury about a month ago.  Since that time she has been favoring the left ankle.  She is been taken ibuprofen for the pain.  His pain is localized to the ankle.  Pain is intermittent in nature.  Is worse at the end of the day.  She did not perform any rehab exercises or physical therapy.  She also reports having bilateral knee pain since the ankle problem.  Bilateral knee pain is over the anterior aspect of the knee.  This is worse with bending or walking up and down stairs.  She also reports having some wheezing that seems to be worse at night.  She smokes on a regular basis.  She is unsure if she has a history of asthma.    HPI  Past Medical History:  Diagnosis Date  . Obesity     Patient Active Problem List   Diagnosis Date Noted  . Obesity, Class III, BMI 40-49.9 (morbid obesity) (HCC) 03/06/2017  . Tobacco use 03/06/2017    Past Surgical History:  Procedure Laterality Date  . BREAST BIOPSY Left 07/31/2013   Procedure: IRRIGATION AND DEBRIDMENT LEFT BREAST ABSCESS;  Surgeon: Lodema Pilot, DO;  Location: [facility];  Service: General;  Laterality: Left;  IRRIGATION AND DEBRIDEMENT LEFT BREAST ABSCESS  . CESAREAN SECTION      OB History   No obstetric history on file.      Home Medications    Prior to Admission medications   Medication Sig Start Date End Date Taking? Authorizing Provider  ibuprofen (ADVIL,MOTRIN) 600 MG tablet Take 1 tablet (600 mg total) by mouth every 6 (six) hours as needed. 12/06/18  Yes Law, Waylan Boga, PA-C  albuterol (PROVENTIL HFA;VENTOLIN HFA) 108 (90 Base) MCG/ACT inhaler Inhale 1-2 puffs into the lungs every 6 (six) hours as needed for  wheezing or shortness of breath. 12/22/18   Myra Rude, MD  amoxicillin-clavulanate (AUGMENTIN) 875-125 MG tablet Take 1 tablet by mouth every 12 (twelve) hours. 12/06/18   Law, Waylan Boga, PA-C  azithromycin (ZITHROMAX) 250 MG tablet Take 1 tablet (250 mg total) by mouth daily. Take first 2 tablets together, then 1 every day until finished. 12/05/18   Roxy Horseman, PA-C  benzonatate (TESSALON) 100 MG capsule Take 2 capsules (200 mg total) by mouth 2 (two) times daily as needed for cough. 12/05/18   Roxy Horseman, PA-C  diclofenac (VOLTAREN) 75 MG EC tablet Take 1 tablet (75 mg total) by mouth 2 (two) times daily. 11/30/18   Sudie Grumbling, NP  guaiFENesin-codeine 100-10 MG/5ML syrup Take 5 mLs by mouth every 6 (six) hours as needed for cough. 12/05/18   Roxy Horseman, PA-C  HYDROcodone-acetaminophen (NORCO/VICODIN) 5-325 MG tablet Take 1-2 tablets by mouth every 6 (six) hours as needed for severe pain. 11/30/18   Sudie Grumbling, NP  oxyCODONE-acetaminophen (PERCOCET/ROXICET) 5-325 MG tablet Take 1 tablet by mouth every 6 (six) hours as needed for severe pain. 12/06/18   Emi Holes, PA-C    Family History Family History  Problem Relation Age of Onset  . Diabetes Father     Social History Social History  Tobacco Use  . Smoking status: Current Every Day Smoker    Packs/day: 1.00    Types: Cigarettes  . Smokeless tobacco: Never Used  Substance Use Topics  . Alcohol use: No  . Drug use: Never     Allergies   Patient has no known allergies.   Review of Systems Review of Systems  Constitutional: Negative for fever.  HENT: Negative for congestion.   Respiratory: Positive for cough and wheezing.   Cardiovascular: Negative for chest pain.  Gastrointestinal: Negative for abdominal distention.  Musculoskeletal: Positive for back pain and gait problem.  Skin: Negative for color change.  Neurological: Negative for weakness.  Hematological: Negative for  adenopathy.  Psychiatric/Behavioral: Negative for agitation.     Physical Exam Triage Vital Signs ED Triage Vitals  Enc Vitals Group     BP 12/22/18 2001 (!) 129/98     Pulse Rate 12/22/18 2001 75     Resp 12/22/18 2001 18     Temp 12/22/18 2001 98.1 F (36.7 C)     Temp Source 12/22/18 2001 Temporal     SpO2 12/22/18 2001 98 %     Weight --      Height --      Head Circumference --      Peak Flow --      Pain Score 12/22/18 1959 10     Pain Loc --      Pain Edu? --      Excl. in GC? --    No data found.  Updated Vital Signs BP 124/66 (BP Location: Right Arm) Comment: large cuff, repositioned  Pulse 75   Temp 98.1 F (36.7 C) (Temporal)   Resp 18   SpO2 98%   Visual Acuity Right Eye Distance:   Left Eye Distance:   Bilateral Distance:    Right Eye Near:   Left Eye Near:    Bilateral Near:     Physical Exam Gen: NAD, alert, cooperative with exam, well-appearing ENT: normal lips, normal nasal mucosa,  Eye: normal EOM, normal conjunctiva and lids CV:  no edema, +2 pedal pulses   Resp: no accessory muscle use, non-labored, clear to auscultation bilaterally Skin: no rashes, no areas of induration  Neuro: normal tone, normal sensation to touch Psych:  normal insight, alert and oriented MSK:  Left ankle: No overlying swelling. Negative anterior drawer. Normal strength resistance. Left and right knee. No signs of effusion. Normal range of motion. No instability. Some pain with patellar grind. Negative McMurray's test. Neurovascular intact   UC Treatments / Results  Labs (all labs ordered are listed, but only abnormal results are displayed) Labs Reviewed - No data to display  EKG None  Radiology No results found.  Procedures Procedures (including critical care time)  Medications Ordered in UC Medications - No data to display  Initial Impression / Assessment and Plan / UC Course  I have reviewed the triage vital signs and the nursing  notes.  Pertinent labs & imaging results that were available during my care of the patient were reviewed by me and considered in my medical decision making (see chart for details).     [name] is a 33 year old female is presenting with left ankle pain, bilateral knee pain, and reports of wheezing.  Left ankle pain is likely result from ongoing ankle instability from an ankle sprain about a month ago.  No signs of a tear based on exam today.  Has good endpoints with anterior drawer.  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  No demonstration of wheezing on exam today.  Seems like her symptoms are intermittent.  Counseled on tobacco use.  Counseled on rehab exercises for the ankles and knees and low back.  Provide albuterol as needed for wheezing.  Can take ibuprofen as needed for any kind of pain.  Given indications to follow-up.   Final Clinical Impressions(s) / UC Diagnoses   Final diagnoses:  Acute left ankle pain  Wheezing  Acute pain of both knees     Discharge Instructions     Please try the exercises. These are going to help with your stability.   Please try ice if needed for pain  Please try heat on the back  Please try the inhaler.  Please follow up if your symptoms fail to improve.     ED Prescriptions    Medication Sig Dispense Auth. Provider   albuterol (PROVENTIL HFA;VENTOLIN HFA) 108 (90 Base) MCG/ACT inhaler Inhale 1-2 puffs into the lungs every 6 (six) hours as needed for wheezing or shortness of breath. 1 Inhaler Myra RudeSchmitz, Jeremy E, MD     Controlled Substance Prescriptions [place] Controlled Substance Registry consulted? Not Applicable   Myra RudeSchmitz, Jeremy E, MD 12/22/18 [phone]

## 2019-01-02 IMAGING — DX DG FOOT COMPLETE 3+V*L*
3 series · 3 of 3 positions shown · non-contrast
Comparison: None.

CLINICAL DATA: Acute left foot pain after fall this morning.

EXAM:
LEFT FOOT - COMPLETE 3+ VIEW

[foot ap]
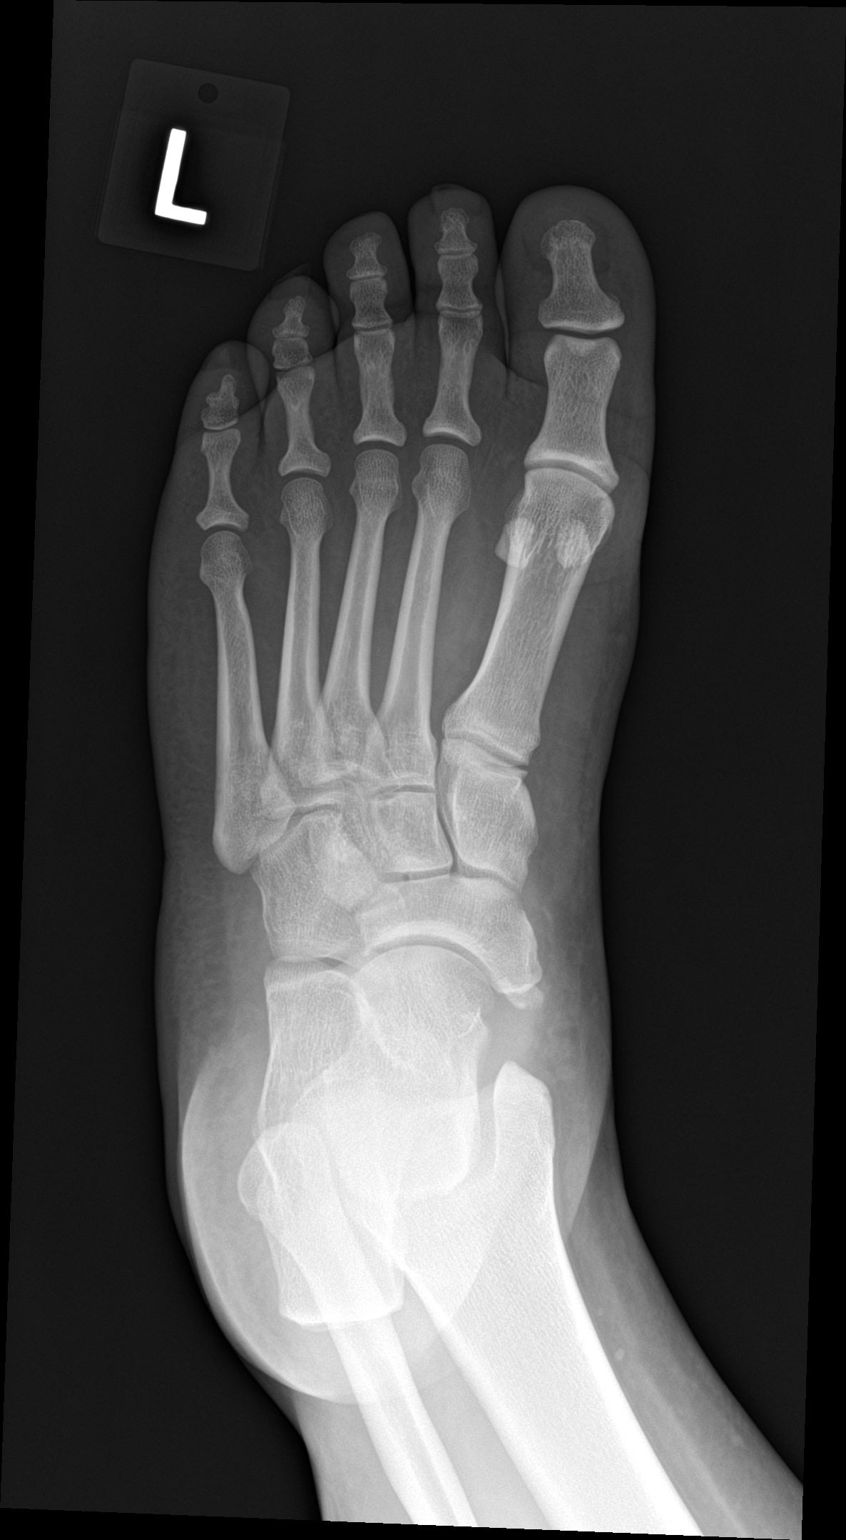

[foot obl]
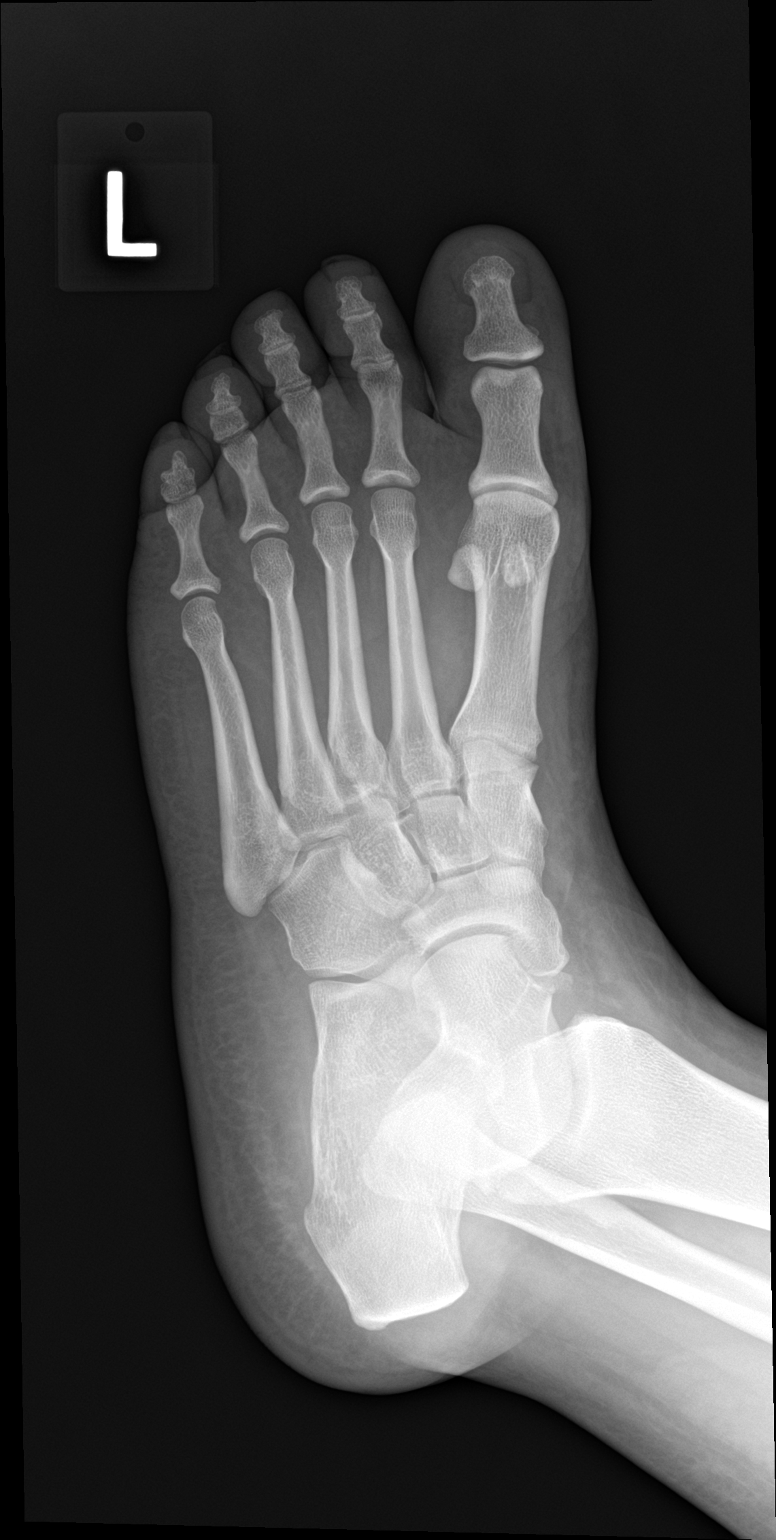

[foot lat]
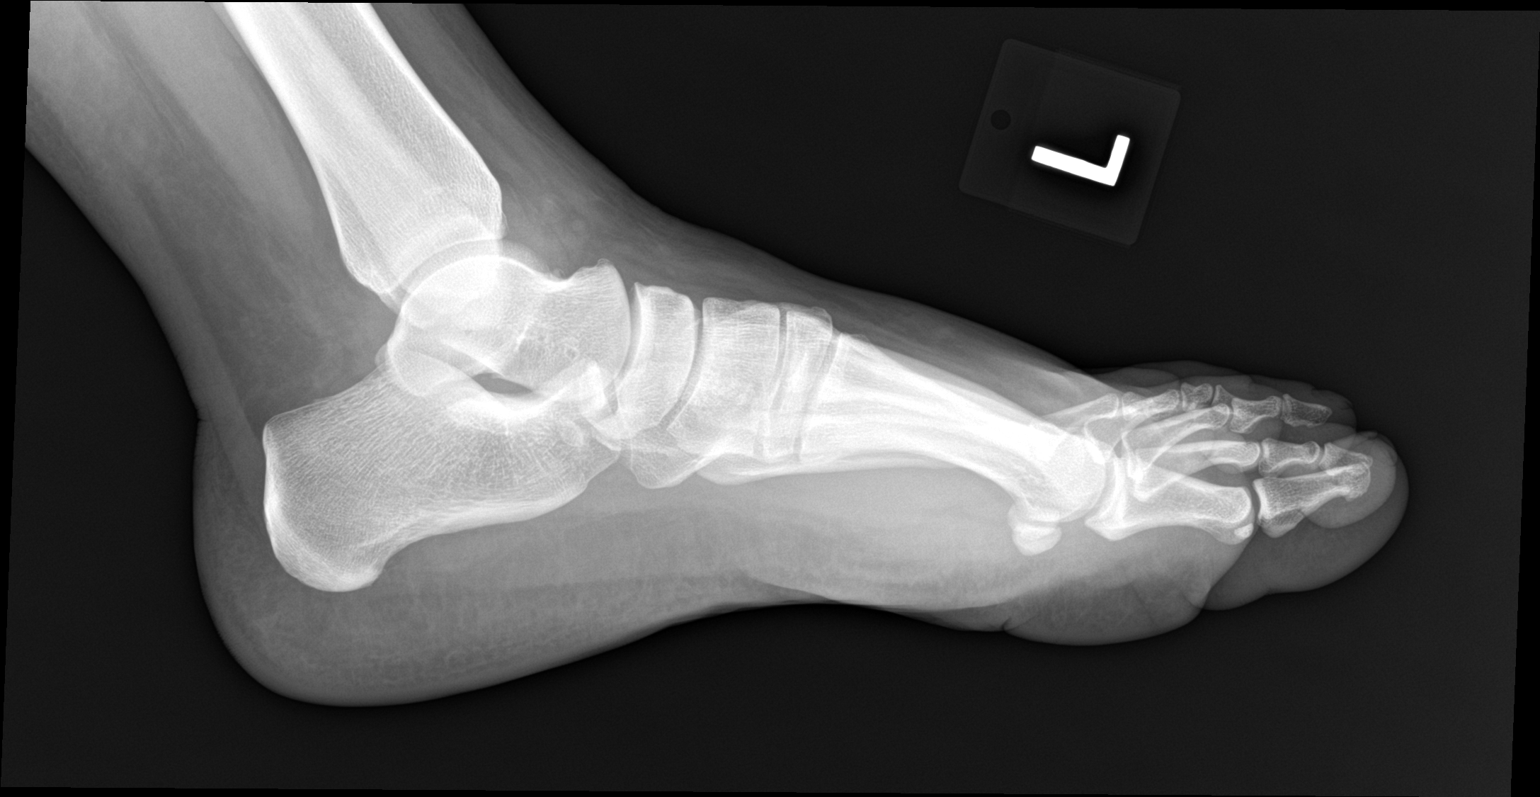

[3 of 3 positions shown; findings below may reference images not displayed]

FINDINGS: There is no evidence of fracture or dislocation. There is no
evidence of arthropathy or other focal bone abnormality. Soft
tissues are unremarkable.
IMPRESSION: Negative.

## 2019-02-26 ENCOUNTER — Ambulatory Visit (HOSPITAL_COMMUNITY)
Admission: EM | Admit: 2019-02-26 | Discharge: 2019-02-26 | Disposition: A | Discharge status: Home / Self Care | Payer: Medicaid Other | Attending: Family Medicine | Admitting: Family Medicine

## 2019-02-26 ENCOUNTER — Other Ambulatory Visit: Payer: Self-pay

## 2019-02-26 ENCOUNTER — Encounter (HOSPITAL_COMMUNITY): Payer: Self-pay | Admitting: Emergency Medicine

## 2019-02-26 DIAGNOSIS — R112 Nausea with vomiting, unspecified: Secondary | ICD-10-CM | POA: Diagnosis not present

## 2019-02-26 LAB — POCT PREGNANCY, URINE: PREG TEST UR: NEGATIVE

## 2019-02-26 MED ORDER — ONDANSETRON 4 MG PO TBDP
ORAL_TABLET | ORAL | Status: AC
  Filled 2019-02-26: qty 1

## 2019-02-26 MED ORDER — ONDANSETRON 8 MG PO TBDP: 8.0000 mg | ORAL_TABLET | Freq: Three times a day (TID) | ORAL | 0 refills | Status: DC | PRN

## 2019-02-26 MED ORDER — OSELTAMIVIR PHOSPHATE 75 MG PO CAPS: 75.0000 mg | ORAL_CAPSULE | Freq: Two times a day (BID) | ORAL | 0 refills | Status: DC

## 2019-02-26 MED ORDER — ONDANSETRON 4 MG PO TBDP
4.0000 mg | ORAL_TABLET | Freq: Once | ORAL | Status: AC
  Administered 2019-02-26: 4 mg via ORAL

## 2019-02-26 NOTE — ED Provider Notes (Signed)
[facility]    CSN: [id number] Arrival date & time: 02/26/19  1830     History   Chief Complaint Chief Complaint  Patient presents with  . Emesis    HPI [name] is a 33 y.o. female.   Pt reports vomiting that started this afternoon.  Pt is afebrile. Reports that she has waves of abd. Pain and then vomits.   Reports her son was sick with similar symptoms and had to be hospitalized for IVF.     Past Medical History:  Diagnosis Date  . Obesity     Patient Active Problem List   Diagnosis Date Noted  . Obesity, Class III, BMI 40-49.9 (morbid obesity) (HCC) 03/06/2017  . Tobacco use 03/06/2017    Past Surgical History:  Procedure Laterality Date  . BREAST BIOPSY Left 07/31/2013   Procedure: IRRIGATION AND DEBRIDMENT LEFT BREAST ABSCESS;  Surgeon: Lodema Pilot, DO;  Location: [facility];  Service: General;  Laterality: Left;  IRRIGATION AND DEBRIDEMENT LEFT BREAST ABSCESS  . CESAREAN SECTION      OB History   No obstetric history on file.      Home Medications    Prior to Admission medications   Medication Sig Start Date End Date Taking? Authorizing Provider  albuterol (PROVENTIL HFA;VENTOLIN HFA) 108 (90 Base) MCG/ACT inhaler Inhale 1-2 puffs into the lungs every 6 (six) hours as needed for wheezing or shortness of breath. 12/22/18   Myra Rude, MD  amoxicillin-clavulanate (AUGMENTIN) 875-125 MG tablet Take 1 tablet by mouth every 12 (twelve) hours. 12/06/18   Law, Waylan Boga, PA-C  azithromycin (ZITHROMAX) 250 MG tablet Take 1 tablet (250 mg total) by mouth daily. Take first 2 tablets together, then 1 every day until finished. 12/05/18   Roxy Horseman, PA-C  benzonatate (TESSALON) 100 MG capsule Take 2 capsules (200 mg total) by mouth 2 (two) times daily as needed for cough. 12/05/18   Roxy Horseman, PA-C  diclofenac (VOLTAREN) 75 MG EC tablet Take 1 tablet (75 mg total) by mouth 2 (two) times daily. 11/30/18   Sudie Grumbling, NP   guaiFENesin-codeine 100-10 MG/5ML syrup Take 5 mLs by mouth every 6 (six) hours as needed for cough. 12/05/18   Roxy Horseman, PA-C  HYDROcodone-acetaminophen (NORCO/VICODIN) 5-325 MG tablet Take 1-2 tablets by mouth every 6 (six) hours as needed for severe pain. 11/30/18   Sudie Grumbling, NP  ibuprofen (ADVIL,MOTRIN) 600 MG tablet Take 1 tablet (600 mg total) by mouth every 6 (six) hours as needed. 12/06/18   Law, Waylan Boga, PA-C  ondansetron (ZOFRAN-ODT) 8 MG disintegrating tablet Take 1 tablet (8 mg total) by mouth every 8 (eight) hours as needed for nausea. 02/26/19   Elvina Sidle, MD  oseltamivir (TAMIFLU) 75 MG capsule Take 1 capsule (75 mg total) by mouth every 12 (twelve) hours. 02/26/19   Elvina Sidle, MD  oxyCODONE-acetaminophen (PERCOCET/ROXICET) 5-325 MG tablet Take 1 tablet by mouth every 6 (six) hours as needed for severe pain. 12/06/18   Emi Holes, PA-C    Family History Family History  Problem Relation Age of Onset  . Diabetes Father     Social History Social History   Tobacco Use  . Smoking status: Current Every Day Smoker    Packs/day: 1.00    Types: Cigarettes  . Smokeless tobacco: Never Used  Substance Use Topics  . Alcohol use: No  . Drug use: Never     Allergies   Patient has no known allergies.  Review of Systems Review of Systems  Constitutional: Negative for chills and fever.  Gastrointestinal: Positive for abdominal pain, nausea and vomiting.  Genitourinary: Negative for dysuria.     Physical Exam Triage Vital Signs ED Triage Vitals [02/26/19 1919]  Enc Vitals Group     BP (!) 144/62     Pulse Rate 85     Resp      Temp (!) 97.5 F (36.4 C)     Temp Source Oral     SpO2 100 %     Weight      Height      Head Circumference      Peak Flow      Pain Score 10     Pain Loc      Pain Edu?      Excl. in GC?    No data found.  Updated Vital Signs BP (!) 144/62 (BP Location: Right Arm)   Pulse 85   Temp (!) 97.5  F (36.4 C) (Oral)   LMP  (LMP Unknown)   SpO2 100%    Physical Exam Vitals signs and nursing note reviewed.  Constitutional:      Appearance: Normal appearance. She is obese.  HENT:     Mouth/Throat:     Mouth: Mucous membranes are moist.  Eyes:     Conjunctiva/sclera: Conjunctivae normal.  Neck:     Musculoskeletal: Neck supple.  Cardiovascular:     Rate and Rhythm: Normal rate and regular rhythm.     Pulses: Normal pulses.     Heart sounds: Normal heart sounds.  Pulmonary:     Breath sounds: Normal breath sounds.  Abdominal:     General: Bowel sounds are normal.     Palpations: Abdomen is soft.     Tenderness: There is abdominal tenderness in the epigastric area. There is no right CVA tenderness or left CVA tenderness.  Skin:    General: Skin is warm and dry.  Neurological:     Mental Status: She is alert and oriented to person, place, and time.      UC Treatments / Results  Labs (all labs ordered are listed, but only abnormal results are displayed) Labs Reviewed  POCT PREGNANCY, URINE    EKG None  Radiology No results found.  Procedures Procedures (including critical care time)  Medications Ordered in UC Medications  ondansetron (ZOFRAN-ODT) disintegrating tablet 4 mg (4 mg Oral Given 02/26/19 1927)    Initial Impression / Assessment and Plan / UC Course  I have reviewed the triage vital signs and the nursing notes.  Pertinent labs & imaging results that were available during my care of the patient were reviewed by me and considered in my medical decision making (see chart for details).    Final Clinical Impressions(s) / UC Diagnoses   Final diagnoses:  Intractable vomiting with nausea, unspecified vomiting type   Discharge Instructions   None    ED Prescriptions    Medication Sig Dispense Auth. Provider   ondansetron (ZOFRAN-ODT) 8 MG disintegrating tablet Take 1 tablet (8 mg total) by mouth every 8 (eight) hours as needed for nausea. 12  tablet Elvina Sidle, MD   oseltamivir (TAMIFLU) 75 MG capsule Take 1 capsule (75 mg total) by mouth every 12 (twelve) hours. 10 capsule Elvina Sidle, MD     Controlled Substance Prescriptions [place] Controlled Substance Registry consulted? Not Applicable   Elvina Sidle, MD 02/26/19 2005

## 2019-02-26 NOTE — ED Triage Notes (Signed)
Pt reports vomiting that started this afternoon.  Pt is afebrile.

## 2019-03-01 ENCOUNTER — Encounter (HOSPITAL_COMMUNITY): Payer: Self-pay

## 2019-03-01 ENCOUNTER — Emergency Department (HOSPITAL_COMMUNITY)
Admission: EM | Admit: 2019-03-01 | Discharge: 2019-03-01 | Disposition: A | Discharge status: Home / Self Care | Payer: Medicaid Other | Attending: Emergency Medicine | Admitting: Emergency Medicine

## 2019-03-01 ENCOUNTER — Other Ambulatory Visit: Payer: Self-pay

## 2019-03-01 DIAGNOSIS — R112 Nausea with vomiting, unspecified: Secondary | ICD-10-CM

## 2019-03-01 DIAGNOSIS — Z79899 Other long term (current) drug therapy: Secondary | ICD-10-CM | POA: Diagnosis not present

## 2019-03-01 DIAGNOSIS — F17210 Nicotine dependence, cigarettes, uncomplicated: Secondary | ICD-10-CM | POA: Insufficient documentation

## 2019-03-01 DIAGNOSIS — N390 Urinary tract infection, site not specified: Secondary | ICD-10-CM | POA: Insufficient documentation

## 2019-03-01 LAB — URINALYSIS, ROUTINE W REFLEX MICROSCOPIC
Bacteria, UA: NONE SEEN
Bilirubin Urine: NEGATIVE
Glucose, UA: NEGATIVE mg/dL
Hgb urine dipstick: NEGATIVE
Ketones, ur: 5 mg/dL — AB
Nitrite: NEGATIVE
Protein, ur: NEGATIVE mg/dL
SPECIFIC GRAVITY, URINE: 1.027 (ref 1.005–1.030)
pH: 6 (ref 5.0–8.0)

## 2019-03-01 LAB — CBC WITH DIFFERENTIAL/PLATELET
Abs Immature Granulocytes: 0.04 10*3/uL (ref 0.00–0.07)
Basophils Absolute: 0 10*3/uL (ref 0.0–0.1)
Basophils Relative: 0 %
Eosinophils Absolute: 0.2 10*3/uL (ref 0.0–0.5)
Eosinophils Relative: 1 %
HCT: 46.1 % — ABNORMAL HIGH (ref 36.0–46.0)
Hemoglobin: 14.8 g/dL (ref 12.0–15.0)
Immature Granulocytes: 0 %
Lymphocytes Relative: 15 %
Lymphs Abs: 2.1 10*3/uL (ref 0.7–4.0)
MCH: 30.5 pg (ref 26.0–34.0)
MCHC: 32.1 g/dL (ref 30.0–36.0)
MCV: 94.9 fL (ref 80.0–100.0)
Monocytes Absolute: 0.6 10*3/uL (ref 0.1–1.0)
Monocytes Relative: 4 %
Neutro Abs: 11.8 10*3/uL — ABNORMAL HIGH (ref 1.7–7.7)
Neutrophils Relative %: 80 %
PLATELETS: 253 10*3/uL (ref 150–400)
RBC: 4.86 MIL/uL (ref 3.87–5.11)
RDW: 13.9 % (ref 11.5–15.5)
WBC: 14.8 10*3/uL — ABNORMAL HIGH (ref 4.0–10.5)
nRBC: 0 % (ref 0.0–0.2)

## 2019-03-01 LAB — COMPREHENSIVE METABOLIC PANEL
ALT: 19 U/L (ref 0–44)
AST: 42 U/L — ABNORMAL HIGH (ref 15–41)
Albumin: 3.8 g/dL (ref 3.5–5.0)
Alkaline Phosphatase: 96 U/L (ref 38–126)
Anion gap: 8 (ref 5–15)
BUN: 6 mg/dL (ref 6–20)
CO2: 23 mmol/L (ref 22–32)
CREATININE: 0.9 mg/dL (ref 0.44–1.00)
Calcium: 8.9 mg/dL (ref 8.9–10.3)
Chloride: 104 mmol/L (ref 98–111)
GFR calc Af Amer: >60 mL/min (ref >60)
GFR calc non Af Amer: >60 mL/min (ref >60)
Glucose, Bld: 86 mg/dL (ref 70–99)
Potassium: 5.9 mmol/L — ABNORMAL HIGH (ref 3.5–5.1)
Sodium: 135 mmol/L (ref 135–145)
Total Bilirubin: 1.9 mg/dL — ABNORMAL HIGH (ref 0.3–1.2)
Total Protein: 7.6 g/dL (ref 6.5–8.1)

## 2019-03-01 LAB — POC URINE PREG, ED: Preg Test, Ur: NEGATIVE

## 2019-03-01 LAB — LIPASE, BLOOD: LIPASE: 56 U/L — AB (ref 11–51)

## 2019-03-01 MED ORDER — SODIUM CHLORIDE 0.9 % IV BOLUS
1000.0000 mL | Freq: Once | INTRAVENOUS | Status: AC
  Administered 2019-03-01: 1000 mL via INTRAVENOUS

## 2019-03-01 MED ORDER — CEPHALEXIN 500 MG PO CAPS: 500.0000 mg | ORAL_CAPSULE | Freq: Two times a day (BID) | ORAL | 0 refills | Status: AC

## 2019-03-01 MED ORDER — ONDANSETRON HCL 4 MG/2ML IJ SOLN
4.0000 mg | Freq: Once | INTRAMUSCULAR | Status: AC
  Administered 2019-03-01: 4 mg via INTRAVENOUS
  Filled 2019-03-01: qty 2

## 2019-03-01 NOTE — ED Provider Notes (Signed)
[facility] EMERGENCY DEPARTMENT Provider Note   CSN: [id number] Arrival date & time: 03/01/19  1521    History   Chief Complaint Chief Complaint  Patient presents with  . Emesis    HPI [name] is a 33 y.o. female.     HPI  Son came home on Sunday and was sick, vomiting, and then Wednesday [name] also started vomiting. Friday and Saturday began to feel better but today took amoxicillin and the started throwing up. Was given tamiflu and zofran by urgent care but did not have fever, cough or body aches.  3-4 episodes of emesis today No diarrhea No hematemesis Normal BM No urinary symptoms No cough, congestion, fever Epigastric apin radiating to umbilicus Worse after eating  Sharp pain 10/10 pain No pain like this before Other son has been sick as well     Past Medical History:  Diagnosis Date  . Obesity     Patient Active Problem List   Diagnosis Date Noted  . Obesity, Class III, BMI 40-49.9 (morbid obesity) (HCC) 03/06/2017  . Tobacco use 03/06/2017    Past Surgical History:  Procedure Laterality Date  . BREAST BIOPSY Left 07/31/2013   Procedure: IRRIGATION AND DEBRIDMENT LEFT BREAST ABSCESS;  Surgeon: Lodema Pilot, DO;  Location: [facility];  Service: General;  Laterality: Left;  IRRIGATION AND DEBRIDEMENT LEFT BREAST ABSCESS  . CESAREAN SECTION       OB History   No obstetric history on file.      Home Medications    Prior to Admission medications   Medication Sig Start Date End Date Taking? Authorizing Provider  albuterol (PROVENTIL HFA;VENTOLIN HFA) 108 (90 Base) MCG/ACT inhaler Inhale 1-2 puffs into the lungs every 6 (six) hours as needed for wheezing or shortness of breath. 12/22/18   Myra Rude, MD  amoxicillin-clavulanate (AUGMENTIN) 875-125 MG tablet Take 1 tablet by mouth every 12 (twelve) hours. 12/06/18   Law, Waylan Boga, PA-C  azithromycin (ZITHROMAX) 250 MG tablet Take 1 tablet (250 mg total) by mouth  daily. Take first 2 tablets together, then 1 every day until finished. 12/05/18   Roxy Horseman, PA-C  benzonatate (TESSALON) 100 MG capsule Take 2 capsules (200 mg total) by mouth 2 (two) times daily as needed for cough. 12/05/18   Roxy Horseman, PA-C  cephALEXin (KEFLEX) 500 MG capsule Take 1 capsule (500 mg total) by mouth 2 (two) times daily for 7 days. 03/01/19 03/08/19  Alvira Monday, MD  diclofenac (VOLTAREN) 75 MG EC tablet Take 1 tablet (75 mg total) by mouth 2 (two) times daily. 11/30/18   Sudie Grumbling, NP  guaiFENesin-codeine 100-10 MG/5ML syrup Take 5 mLs by mouth every 6 (six) hours as needed for cough. 12/05/18   Roxy Horseman, PA-C  HYDROcodone-acetaminophen (NORCO/VICODIN) 5-325 MG tablet Take 1-2 tablets by mouth every 6 (six) hours as needed for severe pain. 11/30/18   Sudie Grumbling, NP  ibuprofen (ADVIL,MOTRIN) 600 MG tablet Take 1 tablet (600 mg total) by mouth every 6 (six) hours as needed. 12/06/18   Law, Waylan Boga, PA-C  ondansetron (ZOFRAN-ODT) 8 MG disintegrating tablet Take 1 tablet (8 mg total) by mouth every 8 (eight) hours as needed for nausea. 02/26/19   Elvina Sidle, MD  oseltamivir (TAMIFLU) 75 MG capsule Take 1 capsule (75 mg total) by mouth every 12 (twelve) hours. 02/26/19   Elvina Sidle, MD  oxyCODONE-acetaminophen (PERCOCET/ROXICET) 5-325 MG tablet Take 1 tablet by mouth every 6 (six) hours as needed for  severe pain. 12/06/18   Emi Holes, PA-C    Family History Family History  Problem Relation Age of Onset  . Diabetes Father     Social History Social History   Tobacco Use  . Smoking status: Current Every Day Smoker    Packs/day: 1.00    Types: Cigarettes  . Smokeless tobacco: Never Used  Substance Use Topics  . Alcohol use: No  . Drug use: Never     Allergies   Patient has no known allergies.   Review of Systems Review of Systems  Constitutional: Negative for fever.  HENT: Negative for sore throat.   Eyes:  Negative for visual disturbance.  Respiratory: Negative for cough and shortness of breath.   Cardiovascular: Negative for chest pain.  Gastrointestinal: Positive for abdominal pain, nausea and vomiting. Negative for constipation and diarrhea.  Genitourinary: Negative for difficulty urinating and dysuria.  Musculoskeletal: Negative for back pain and neck pain.  Skin: Negative for rash.  Neurological: Negative for syncope and headaches.     Physical Exam Updated Vital Signs BP 112/64   Pulse 82   Temp 97.7 F (36.5 C)   Resp 18   LMP 02/10/2019   SpO2 100%   Physical Exam Vitals signs and nursing note reviewed.  Constitutional:      General: She is not in acute distress.    Appearance: She is well-developed. She is not diaphoretic.  HENT:     Head: Normocephalic and atraumatic.  Eyes:     Conjunctiva/sclera: Conjunctivae normal.  Neck:     Musculoskeletal: Normal range of motion.  Cardiovascular:     Rate and Rhythm: Normal rate and regular rhythm.     Heart sounds: Normal heart sounds. No murmur. No friction rub. No gallop.   Pulmonary:     Effort: Pulmonary effort is normal. No respiratory distress.     Breath sounds: Normal breath sounds. No wheezing or rales.  Abdominal:     General: There is no distension.     Palpations: Abdomen is soft.     Tenderness: There is abdominal tenderness (mild suprapubic). There is no right CVA tenderness, left CVA tenderness or guarding.     Comments: Negative murphy's   Musculoskeletal:        General: No tenderness.  Skin:    General: Skin is warm and dry.     Findings: No erythema or rash.  Neurological:     Mental Status: She is alert and oriented to person, place, and time.      ED Treatments / Results  Labs (all labs ordered are listed, but only abnormal results are displayed) Labs Reviewed  CBC WITH DIFFERENTIAL/PLATELET - Abnormal; Notable for the following components:      Result Value   WBC 14.8 (*)    HCT 46.1  (*)    Neutro Abs 11.8 (*)    All other components within normal limits  COMPREHENSIVE METABOLIC PANEL - Abnormal; Notable for the following components:   Potassium 5.9 (*)    AST 42 (*)    Total Bilirubin 1.9 (*)    All other components within normal limits  URINALYSIS, ROUTINE W REFLEX MICROSCOPIC - Abnormal; Notable for the following components:   APPearance HAZY (*)    Ketones, ur 5 (*)    Leukocytes,Ua TRACE (*)    All other components within normal limits  LIPASE, BLOOD - Abnormal; Notable for the following components:   Lipase 56 (*)    All other components within  normal limits  POC URINE PREG, ED    EKG None  Radiology No results found.  Procedures Procedures (including critical care time)  Medications Ordered in ED Medications  sodium chloride 0.9 % bolus 1,000 mL (1,000 mLs Intravenous New Bag/Given 03/01/19 1615)  ondansetron (ZOFRAN) injection 4 mg (4 mg Intravenous Given 03/01/19 1616)     Initial Impression / Assessment and Plan / ED Course  I have reviewed the triage vital signs and the nursing notes.  Pertinent labs & imaging results that were available during my care of the patient were reviewed by me and considered in my medical decision making (see chart for details).        32yo female with no significant medical history presents with nausea, vomiting and abdominal pain.  DDx includes gastroenteritis, side effect of abx or tamiflu, cholelithiasis, pancreatitis.  Doubt cholecystitis given afebrile, no significant tenderness on exam, no continuous pain, more likely gastritis/gastroenteritis given sick contacts.  Labs show no sign of pancreatitis, hepatitis, renal disease or electrolyte abnormalities. Mild elevation of lipase, AST, K in setting of hemolysis per lab, and not significant elevation to suspect hepatitis or pancreatitis.  Pregnancy test negative, UA with WBC, possible UTI/pyelonphritis as etiology of symptoms.  Recommend d/c tamiflu. Has zofran  rx. Recommend keflex for possible UTI, continued supportive care and discussed reasons to return in detail. Patient discharged in stable condition with understanding of reasons to return.    Final Clinical Impressions(s) / ED Diagnoses   Final diagnoses:  Nausea and vomiting, intractability of vomiting not specified, unspecified vomiting type  Urinary tract infection without hematuria, site unspecified    ED Discharge Orders         Ordered    cephALEXin (KEFLEX) 500 MG capsule  2 times daily     03/01/19 1837           Alvira Monday, MD 03/01/19 1850

## 2019-03-01 NOTE — ED Triage Notes (Signed)
Seen at u/c 02-26-19 for vomiting.  Was prescribed Tamiflu and Zofran.  No improvement.

## 2019-06-08 ENCOUNTER — Other Ambulatory Visit: Payer: Self-pay

## 2019-06-08 ENCOUNTER — Encounter (HOSPITAL_COMMUNITY): Payer: Self-pay | Admitting: Emergency Medicine

## 2019-06-08 ENCOUNTER — Ambulatory Visit (HOSPITAL_COMMUNITY)
Admission: EM | Admit: 2019-06-08 | Discharge: 2019-06-08 | Disposition: A | Discharge status: Home / Self Care | Payer: Medicaid Other | Attending: Family Medicine | Admitting: Family Medicine

## 2019-06-08 DIAGNOSIS — M659 Synovitis and tenosynovitis, unspecified: Secondary | ICD-10-CM

## 2019-06-08 MED ORDER — METHYLPREDNISOLONE 4 MG PO TBPK: ORAL_TABLET | ORAL | 0 refills | Status: DC

## 2019-06-08 NOTE — ED Triage Notes (Addendum)
Right index finger hurts.  Pain for 2 days, unknown how injury occurred.  Swelling in right index finger  Patient is maintaining a phone to head.

## 2019-06-08 NOTE — ED Provider Notes (Signed)
[facility]    CSN: [id number] Arrival date & time: 06/08/19  1642      History   Chief Complaint Chief Complaint  Patient presents with  . Finger Injury    HPI [name] is a 33 y.o. female.   HPI  Pain in right index finger.  No known overuse.  No known injury.  It swollen.  Hurts to move.  Never had this before. Does not have any activities with repetitive hand or finger movement. States she is otherwise in good health  Past Medical History:  Diagnosis Date  . Obesity     Patient Active Problem List   Diagnosis Date Noted  . Obesity, Class III, BMI 40-49.9 (morbid obesity) ([place]) 03/06/2017  . Tobacco use 03/06/2017    Past Surgical History:  Procedure Laterality Date  . BREAST BIOPSY Left 07/31/2013   Procedure: IRRIGATION AND DEBRIDMENT LEFT BREAST ABSCESS;  Surgeon: Madilyn Hook, DO;  Location: [facility];  Service: General;  Laterality: Left;  IRRIGATION AND DEBRIDEMENT LEFT BREAST ABSCESS  . CESAREAN SECTION      OB History   No obstetric history on file.      Home Medications    Prior to Admission medications   Medication Sig Start Date End Date Taking? Authorizing Provider  albuterol (PROVENTIL HFA;VENTOLIN HFA) 108 (90 Base) MCG/ACT inhaler Inhale 1-2 puffs into the lungs every 6 (six) hours as needed for wheezing or shortness of breath. 12/22/18   Rosemarie Ax, MD  methylPREDNISolone (MEDROL DOSEPAK) 4 MG TBPK tablet tad 06/08/19   Raylene Everts, MD  oxyCODONE-acetaminophen (PERCOCET/ROXICET) 5-325 MG tablet Take 1 tablet by mouth every 6 (six) hours as needed for severe pain. 12/06/18   Frederica Kuster, PA-C    Family History Family History  Problem Relation Age of Onset  . Diabetes Father     Social History Social History   Tobacco Use  . Smoking status: Former Smoker    Packs/day: 1.00    Types: Cigarettes  . Smokeless tobacco: Never Used  Substance Use Topics  . Alcohol use: No  . Drug use: Never      Allergies   Patient has no known allergies.   Review of Systems Review of Systems  Constitutional: Negative for chills and fever.  HENT: Negative for ear pain and sore throat.   Eyes: Negative for pain and visual disturbance.  Respiratory: Negative for cough and shortness of breath.   Cardiovascular: Negative for chest pain and palpitations.  Gastrointestinal: Negative for abdominal pain and vomiting.  Genitourinary: Negative for dysuria and hematuria.  Musculoskeletal: Positive for arthralgias. Negative for back pain.  Skin: Negative for color change and rash.  Neurological: Negative for seizures and syncope.  All other systems reviewed and are negative.    Physical Exam Triage Vital Signs ED Triage Vitals  Enc Vitals Group     BP 06/08/19 1707 131/85     Pulse Rate 06/08/19 1707 86     Resp 06/08/19 1707 18     Temp 06/08/19 1707 98.2 F (36.8 C)     Temp Source 06/08/19 1707 Oral     SpO2 06/08/19 1707 100 %     Weight --      Height --      Head Circumference --      Peak Flow --      Pain Score 06/08/19 1704 10     Pain Loc --      Pain Edu? --  Excl. in GC? --    No data found.  Updated Vital Signs BP 131/85 (BP Location: Left Arm)   Pulse 86   Temp 98.2 F (36.8 C) (Oral)   Resp 18   LMP 05/25/2019   SpO2 100%   Visual Acuity Right Eye Distance:   Left Eye Distance:   Bilateral Distance:    Right Eye Near:   Left Eye Near:    Bilateral Near:     Physical Exam Constitutional:      General: She is not in acute distress.    Appearance: She is well-developed.  HENT:     Head: Normocephalic and atraumatic.  Eyes:     Conjunctiva/sclera: Conjunctivae normal.     Pupils: Pupils are equal, round, and reactive to light.  Neck:     Musculoskeletal: Normal range of motion.  Cardiovascular:     Rate and Rhythm: Normal rate.  Pulmonary:     Effort: Pulmonary effort is normal. No respiratory distress.  Abdominal:     General: There is no  distension.     Palpations: Abdomen is soft.  Musculoskeletal: Normal range of motion.     Comments: Pain with palpation of the extensor tendon of the right index finger.  Extreme pain with resistance to finger extension  Skin:    General: Skin is warm and dry.     Comments: Swelling and tenderness over the dorsum of the right finger especially over the proximal phalanx and distal meta carpal  Neurological:     General: No focal deficit present.     Mental Status: She is alert.     Sensory: No sensory deficit.     Coordination: Coordination normal.     Deep Tendon Reflexes: Reflexes normal.      UC Treatments / Results  Labs (all labs ordered are listed, but only abnormal results are displayed) Labs Reviewed - No data to display  EKG None  Radiology No results found.  Procedures Procedures (including critical care time)  Medications Ordered in UC Medications - No data to display  Initial Impression / Assessment and Plan / UC Course  I have reviewed the triage vital signs and the nursing notes.  Pertinent labs & imaging results that were available during my care of the patient were reviewed by me and considered in my medical decision making (see chart for details).     Reviewed that this could have been from a trauma, overuse, or stretching injury.  The tendon is inflamed.  Discussed rest ice anti-inflammatories. Final Clinical Impressions(s) / UC Diagnoses   Final diagnoses:  Tenosynovitis of finger     Discharge Instructions     Put ice on the injured area for 20 minutes every 2-4 hours Take the Medrol Dosepak (steroid) as directed.  Take all of day 1 today.  You can take 3 now and then 3 when you go to bed tonight.  Tomorrow take it on schedule Wear splint for the next 2 to 3 days.  Try not to bend or use the finger. Call or return if not improving towards the end of the week.  You may want to call your primary care doctor if this is an ongoing problem   ED  Prescriptions    Medication Sig Dispense Auth. Provider   methylPREDNISolone (MEDROL DOSEPAK) 4 MG TBPK tablet tad 21 tablet Eustace MooreNelson, [place] Sue, MD     Controlled Substance Prescriptions [place] Controlled Substance Registry consulted? Not Applicable   Eustace MooreNelson, Kitty [place] Sue, MD  06/08/19 1936  

## 2019-06-08 NOTE — Discharge Instructions (Signed)
Put ice on the injured area for 20 minutes every 2-4 hours Take the Medrol Dosepak (steroid) as directed.  Take all of day 1 today.  You can take 3 now and then 3 when you go to bed tonight.  Tomorrow take it on schedule Wear splint for the next 2 to 3 days.  Try not to bend or use the finger. Call or return if not improving towards the end of the week.  You may want to call your primary care doctor if this is an ongoing problem

## 2019-12-06 ENCOUNTER — Encounter (HOSPITAL_COMMUNITY): Payer: Self-pay

## 2019-12-06 ENCOUNTER — Ambulatory Visit (HOSPITAL_COMMUNITY)
Admission: EM | Admit: 2019-12-06 | Discharge: 2019-12-06 | Disposition: A | Discharge status: Home / Self Care | Payer: Medicaid Other | Attending: Urgent Care | Admitting: Urgent Care

## 2019-12-06 ENCOUNTER — Other Ambulatory Visit: Payer: Self-pay

## 2019-12-06 DIAGNOSIS — L309 Dermatitis, unspecified: Secondary | ICD-10-CM

## 2019-12-06 DIAGNOSIS — Z3490 Encounter for supervision of normal pregnancy, unspecified, unspecified trimester: Secondary | ICD-10-CM

## 2019-12-06 DIAGNOSIS — L299 Pruritus, unspecified: Secondary | ICD-10-CM

## 2019-12-06 MED ORDER — TRIAMCINOLONE ACETONIDE 0.1 % EX CREA: 1.0000 "application " | TOPICAL_CREAM | Freq: Two times a day (BID) | CUTANEOUS | 0 refills | Status: DC

## 2019-12-06 MED ORDER — CETIRIZINE HCL 10 MG PO TABS: 10.0000 mg | ORAL_TABLET | Freq: Every day | ORAL | 0 refills | Status: DC

## 2019-12-06 NOTE — ED Triage Notes (Signed)
Patient presents to Urgent Care with complaints of itchy rash on the left side of her face since three days ago. Patient reports she has been putting neosporin on it but it is still spreading.

## 2019-12-06 NOTE — ED Provider Notes (Signed)
[facility]   MRN: [id number] DOB: 1986-09-01  Subjective:   [name] is a 33 y.o. female presenting for 3-day history of acute onset worsening pruritic rash over the left side of her face.  Patient states she has been using Neosporin without any kind of relief.  She is also 5 months pregnant.  Denies history of lupus, eczema, rosacea.  She has only using albuterol as needed.  No Known Allergies  Past Medical History:  Diagnosis Date  . Obesity      Past Surgical History:  Procedure Laterality Date  . BREAST BIOPSY Left 07/31/2013   Procedure: IRRIGATION AND DEBRIDMENT LEFT BREAST ABSCESS;  Surgeon: Madilyn Hook, DO;  Location: [facility];  Service: General;  Laterality: Left;  IRRIGATION AND DEBRIDEMENT LEFT BREAST ABSCESS  . CESAREAN SECTION      Family History  Problem Relation Age of Onset  . Diabetes Father     Social History   Tobacco Use  . Smoking status: Former Smoker    Packs/day: 1.00    Types: Cigarettes  . Smokeless tobacco: Never Used  Substance Use Topics  . Alcohol use: No  . Drug use: Never    Review of Systems  Constitutional: Negative for fever and malaise/fatigue.  HENT: Negative for congestion, ear pain, sinus pain and sore throat.   Eyes: Negative for discharge and redness.  Respiratory: Negative for cough, hemoptysis, shortness of breath and wheezing.   Cardiovascular: Negative for chest pain.  Gastrointestinal: Negative for abdominal pain, diarrhea, nausea and vomiting.  Genitourinary: Negative for dysuria, flank pain and hematuria.  Musculoskeletal: Negative for myalgias.  Skin: Positive for itching and rash.  Neurological: Negative for dizziness, weakness and headaches.  Psychiatric/Behavioral: Negative for depression and substance abuse.     Objective:   Vitals: BP 119/75 (BP Location: Left Arm)   Pulse 91   Temp 98.1 F (36.7 C) (Oral)   Resp 16   LMP 05/25/2019   SpO2 100%   Physical Exam Constitutional:       General: She is not in acute distress.    Appearance: Normal appearance. She is well-developed. She is not ill-appearing, toxic-appearing or diaphoretic.  HENT:     Head: Normocephalic and atraumatic.     Right Ear: Tympanic membrane and ear canal normal. No drainage or tenderness. No middle ear effusion. Tympanic membrane is not erythematous.     Left Ear: Tympanic membrane and ear canal normal. No drainage or tenderness.  No middle ear effusion. Tympanic membrane is not erythematous.     Nose: Nose normal. No congestion or rhinorrhea.     Mouth/Throat:     Mouth: Mucous membranes are moist. No oral lesions.     Pharynx: Oropharynx is clear. No pharyngeal swelling, oropharyngeal exudate, posterior oropharyngeal erythema or uvula swelling.     Tonsils: No tonsillar exudate or tonsillar abscesses.  Eyes:     General: No scleral icterus.    Extraocular Movements: Extraocular movements intact.     Right eye: Normal extraocular motion.     Left eye: Normal extraocular motion.     Conjunctiva/sclera: Conjunctivae normal.     Pupils: Pupils are equal, round, and reactive to light.  Cardiovascular:     Rate and Rhythm: Normal rate.  Pulmonary:     Effort: Pulmonary effort is normal.  Musculoskeletal:     Cervical back: Normal range of motion and neck supple.  Lymphadenopathy:     Cervical: No cervical adenopathy.  Skin:  General: Skin is warm and dry.     Findings: Rash (Over left lower side of face that is dry and scaly with similar developing rash of right side of face; no rash over oropharynx, palms of hands or neck area) present.  Neurological:     General: No focal deficit present.     Mental Status: She is alert and oriented to person, place, and time.  Psychiatric:        Mood and Affect: Mood normal.        Behavior: Behavior normal.           Assessment and Plan :   1. Facial eczema   2. Itching   3. Pregnancy, unspecified gestational age     Start  triamcinolone to address significant inflammatory rash such as facial eczema.  Counseled on appropriate dosing and administration of triamcinolone cream.  Will use Zyrtec for itching. Counseled patient on potential for adverse effects with medications prescribed/recommended today, ER and return-to-clinic precautions discussed, patient verbalized understanding.    Wallis Bamberg, PA-C 12/06/19 1555

## 2019-12-18 HISTORY — DX: Maternal care for unspecified type scar from previous cesarean delivery: O34.219

## 2019-12-18 NOTE — L&D Delivery Note (Signed)
Delivery Note At 12:18 PM a viable female was delivered via Vaginal, Spontaneous (Presentation:   Occiput Anterior).  APGAR: 9, 9; weight  pending.   Placenta status: Spontaneous, Intact.  Cord: 3 vessels with the following complications: None.  Cord pH: NA  Anesthesia: None Episiotomy: None Lacerations: right and left sulcus repaired with  Suture Repair: 3.0 with vicryl Est. Blood Loss (mL):  200  Mom to postpartum.  Baby to Couplet care / Skin to Skin.  Charlesetta Garibaldi Shreya Lacasse 05/29/2020, 1:08 PM

## 2020-02-08 ENCOUNTER — Other Ambulatory Visit: Payer: Self-pay

## 2020-02-08 ENCOUNTER — Encounter (HOSPITAL_COMMUNITY): Payer: Self-pay | Admitting: Obstetrics and Gynecology

## 2020-02-08 ENCOUNTER — Inpatient Hospital Stay (HOSPITAL_BASED_OUTPATIENT_CLINIC_OR_DEPARTMENT_OTHER): Payer: Medicaid Other

## 2020-02-08 ENCOUNTER — Inpatient Hospital Stay (HOSPITAL_COMMUNITY)
Admission: AD | Admit: 2020-02-08 | Discharge: 2020-02-08 | Disposition: A | Discharge status: Home / Self Care | Payer: Medicaid Other | Attending: Obstetrics and Gynecology | Admitting: Obstetrics and Gynecology

## 2020-02-08 DIAGNOSIS — Z87891 Personal history of nicotine dependence: Secondary | ICD-10-CM | POA: Insufficient documentation

## 2020-02-08 DIAGNOSIS — R109 Unspecified abdominal pain: Secondary | ICD-10-CM | POA: Diagnosis present

## 2020-02-08 DIAGNOSIS — Z8619 Personal history of other infectious and parasitic diseases: Secondary | ICD-10-CM | POA: Diagnosis present

## 2020-02-08 DIAGNOSIS — A5901 Trichomonal vulvovaginitis: Secondary | ICD-10-CM

## 2020-02-08 DIAGNOSIS — Z98891 History of uterine scar from previous surgery: Secondary | ICD-10-CM

## 2020-02-08 DIAGNOSIS — Z3A21 21 weeks gestation of pregnancy: Secondary | ICD-10-CM

## 2020-02-08 DIAGNOSIS — F329 Major depressive disorder, single episode, unspecified: Secondary | ICD-10-CM

## 2020-02-08 DIAGNOSIS — Z833 Family history of diabetes mellitus: Secondary | ICD-10-CM | POA: Diagnosis not present

## 2020-02-08 DIAGNOSIS — O23593 Infection of other part of genital tract in pregnancy, third trimester: Secondary | ICD-10-CM | POA: Diagnosis present

## 2020-02-08 DIAGNOSIS — Z3490 Encounter for supervision of normal pregnancy, unspecified, unspecified trimester: Secondary | ICD-10-CM

## 2020-02-08 DIAGNOSIS — O358XX0 Maternal care for other (suspected) fetal abnormality and damage, not applicable or unspecified: Secondary | ICD-10-CM | POA: Insufficient documentation

## 2020-02-08 DIAGNOSIS — O0932 Supervision of pregnancy with insufficient antenatal care, second trimester: Secondary | ICD-10-CM

## 2020-02-08 DIAGNOSIS — F319 Bipolar disorder, unspecified: Secondary | ICD-10-CM

## 2020-02-08 DIAGNOSIS — O99212 Obesity complicating pregnancy, second trimester: Secondary | ICD-10-CM | POA: Diagnosis not present

## 2020-02-08 DIAGNOSIS — F209 Schizophrenia, unspecified: Secondary | ICD-10-CM

## 2020-02-08 DIAGNOSIS — F32A Depression, unspecified: Secondary | ICD-10-CM

## 2020-02-08 DIAGNOSIS — O34219 Maternal care for unspecified type scar from previous cesarean delivery: Secondary | ICD-10-CM

## 2020-02-08 DIAGNOSIS — O283 Abnormal ultrasonic finding on antenatal screening of mother: Secondary | ICD-10-CM

## 2020-02-08 HISTORY — DX: Trichomonal vulvovaginitis: A59.01

## 2020-02-08 HISTORY — DX: Unspecified asthma, uncomplicated: J45.909

## 2020-02-08 HISTORY — DX: Abnormal ultrasonic finding on antenatal screening of mother: O28.3

## 2020-02-08 LAB — URINALYSIS, ROUTINE W REFLEX MICROSCOPIC
Bilirubin Urine: NEGATIVE
Glucose, UA: NEGATIVE mg/dL
Hgb urine dipstick: NEGATIVE
Ketones, ur: NEGATIVE mg/dL
Nitrite: NEGATIVE
Protein, ur: NEGATIVE mg/dL
Specific Gravity, Urine: 1.01 (ref 1.005–1.030)
pH: 7 (ref 5.0–8.0)

## 2020-02-08 LAB — WET PREP, GENITAL
Clue Cells Wet Prep HPF POC: NONE SEEN
Sperm: NONE SEEN
Trich, Wet Prep: NONE SEEN
Yeast Wet Prep HPF POC: NONE SEEN

## 2020-02-08 LAB — POCT PREGNANCY, URINE: Preg Test, Ur: POSITIVE — AB

## 2020-02-08 LAB — URINALYSIS, MICROSCOPIC (REFLEX)

## 2020-02-08 MED ORDER — PREPLUS 27-1 MG PO TABS: 1.0000 | ORAL_TABLET | Freq: Every day | ORAL | 13 refills | Status: DC

## 2020-02-08 MED ORDER — ASPIRIN EC 81 MG PO TBEC: 81.0000 mg | DELAYED_RELEASE_TABLET | Freq: Every day | ORAL | 2 refills | Status: DC

## 2020-02-08 NOTE — MAU Provider Note (Addendum)
Chief Complaint:  Abdominal Pain   First Provider Initiated Contact with Patient 02/08/20 [phone]      HPI: [name] is a 34 y.o. [phone] with IUP of unknown gestational age, who presents to maternity admissions reporting lower abdominal pain. Patient reports her lower abdominal pain feels like "normal pregnancy contractions". The pain is unchanged over the last month and has not increased in frequency. She reports good fetal movement, denies LOF, vaginal bleeding, vaginal itching/burning, urinary symptoms, h/a, dizziness, n/v, or fever/chills.   Patient states that she is here today mostly to find out her gestational age. She does not know her LMP and has not had any prenatal [place]. She takes a prenatal vitamin daily, but has received no other prenatal care. She is requesting to not have any blood work done today, but states that she is open to referral for follow-up prenatal care after discharge.  Past Medical History: Past Medical History:  Diagnosis Date  . Asthma   . Obesity     Past obstetric history: OB History  Gravida Para Term Preterm AB Living  4 3 0 2   2  SAB TAB Ectopic Multiple Live Births        1 3    # Outcome Date GA Lbr Len/2nd Weight Sex Delivery Anes PTL Lv  4A Gravida           4B Current           3 Para 03/29/11    M Vag-Spont   LIV  2 Preterm 02/27/09    F Vag-Spont   ND  1 Preterm 07/24/04    M CS-Unspec   LIV    Past Surgical History: Past Surgical History:  Procedure Laterality Date  . BREAST BIOPSY Left 07/31/2013   Procedure: IRRIGATION AND DEBRIDMENT LEFT BREAST ABSCESS;  Surgeon: Lodema Pilot, DO;  Location: [facility];  Service: General;  Laterality: Left;  IRRIGATION AND DEBRIDEMENT LEFT BREAST ABSCESS  . CESAREAN SECTION      Family History: Family History  Problem Relation Age of Onset  . Diabetes Father     Social History: Social History   Tobacco Use  . Smoking status: Former Smoker    Packs/day: 1.00    Types: Cigarettes  .  Smokeless tobacco: Never Used  Substance Use Topics  . Alcohol use: No  . Drug use: Never    Allergies: No Known Allergies  Meds:  Medications Prior to Admission  Medication Sig Dispense Refill Last Dose  . Prenatal Vit-Fe Fumarate-FA (MULTIVITAMIN-PRENATAL) 27-0.8 MG TABS tablet Take 1 tablet by mouth daily at 12 noon.   02/06/2020  . albuterol (PROVENTIL HFA;VENTOLIN HFA) 108 (90 Base) MCG/ACT inhaler Inhale 1-2 puffs into the lungs every 6 (six) hours as needed for wheezing or shortness of breath. 1 Inhaler 0 More than a month at Unknown time  . cetirizine (ZYRTEC ALLERGY) 10 MG tablet Take 1 tablet (10 mg total) by mouth daily. 30 tablet 0   . triamcinolone cream (KENALOG) 0.1 % Apply 1 application topically 2 (two) times daily. 30 g 0     ROS:  Review of Systems  All other systems reviewed and are negative.  I have reviewed patient's Past Medical Hx, Surgical Hx, Family Hx, Social Hx, medications and allergies.   Physical Exam   Patient Vitals for the past 24 hrs:  BP Temp Temp src Pulse Resp SpO2 Height Weight  02/08/20 0948 128/88 97.8 F (36.6 C) Oral 89 20 100 % -- --  02/08/20 0947 -- -- -- -- -- -- 5\' 1"  (1.549 m) 99.4 kg   Constitutional: Well-developed, well-nourished female in no acute distress.  Cardiovascular: normal rate Respiratory: normal effort GI: Abd soft, non-tender, gravid appropriate for gestational age.  MS: Extremities nontender, no edema, normal ROM Neurologic: Alert and oriented x 4.  GU: Neg CVAT.  PELVIC EXAM: Cervix pink, visually closed, without lesion, scant white-green discharge, vaginal walls and external genitalia normal Bimanual exam: Cervix 0/long/high, firm, anterior, neg CMT, uterus nontender, nonenlarged, adnexa without tenderness, enlargement, or mass     Labs: Results for orders placed or performed during the hospital encounter of 02/08/20 (from the past 24 hour(s))  Pregnancy, urine POC     Status: Abnormal   Collection Time:  02/08/20  9:32 AM  Result Value Ref Range   Preg Test, Ur POSITIVE (A) NEGATIVE  Urinalysis, Routine w reflex microscopic     Status: Abnormal   Collection Time: 02/08/20  9:35 AM  Result Value Ref Range   Color, Urine YELLOW YELLOW   APPearance CLOUDY (A) CLEAR   Specific Gravity, Urine 1.010 1.005 - 1.030   pH 7.0 5.0 - 8.0   Glucose, UA NEGATIVE NEGATIVE mg/dL   Hgb urine dipstick NEGATIVE NEGATIVE   Bilirubin Urine NEGATIVE NEGATIVE   Ketones, ur NEGATIVE NEGATIVE mg/dL   Protein, ur NEGATIVE NEGATIVE mg/dL   Nitrite NEGATIVE NEGATIVE   Leukocytes,Ua MODERATE (A) NEGATIVE  Urinalysis, Microscopic (reflex)     Status: Abnormal   Collection Time: 02/08/20  9:35 AM  Result Value Ref Range   RBC / HPF 0-5 0 - 5 RBC/hpf   WBC, UA 6-10 0 - 5 WBC/hpf   Bacteria, UA RARE (A) NONE SEEN   Squamous Epithelial / LPF 6-10 0 - 5  Wet prep, genital     Status: Abnormal   Collection Time: 02/08/20 10:30 AM   Specimen: PATH Cytology Cervicovaginal Ancillary Only  Result Value Ref Range   Yeast Wet Prep HPF POC NONE SEEN NONE SEEN   Trich, Wet Prep NONE SEEN NONE SEEN   Clue Cells Wet Prep HPF POC NONE SEEN NONE SEEN   WBC, Wet Prep HPF POC MANY (A) NONE SEEN   Sperm NONE SEEN       Imaging:  No results found.  MAU Course/MDM: Orders Placed This Encounter  Procedures  . Wet prep, genital  . Culture, OB Urine  . 02/10/20 MFM OB DETAIL +14 WK  . Urinalysis, Routine w reflex microscopic  . Urinalysis, Microscopic (reflex)  . Pregnancy, urine POC  . Discharge patient    Meds ordered this encounter  Medications  . Prenatal Vit-Fe Fumarate-FA (PREPLUS) 27-1 MG TABS    Sig: Take 1 tablet by mouth daily.    Dispense:  30 tablet    Refill:  13    Order Specific Question:   Supervising Provider    Answer:   [place], MICHAEL L [[id number]]  . aspirin EC 81 MG tablet    Sig: Take 1 tablet (81 mg total) by mouth daily. Take after 12 weeks for prevention of preeclampsia later in pregnancy     Dispense:  300 tablet    Refill:  2    Order Specific Question:   Supervising Provider    Answer:   Alysia Penna, MICHAEL L [[id number]]    Presented with lower abdominal pain consistent with intermittent uterine contractions in pregnancy.   Given her lack of prenatal care and unknown gestational age, pt sent for MFM anatomy  US. Anatomy US revealed a fetus at [redacted] weeks gestation, notable for a fetal echogenic intracardiac focus. Remainder of [place] is otherwise normal with an EDD of 06/20/2020.  On exam, her cervix was closed with some white-green discharge concerning for possible infection. UA revealed moderate leukocytes so wet prep and urine culture were ordered. Wet prep was negative for trichomonas and gardnerella. Urine culture pending.  Patient declined blood work today, but is open to it at an outpatient visit. Pt discharged with strict labor precautions and referral to [facility] for follow-up prenatal care and blood work.  Assessment: 1. No prenatal care in current pregnancy in second trimester   2. Uncertain dates, antepartum   3. Fetal echogenic intracardiac focus on prenatal ultrasound   4. [redacted] weeks gestation of pregnancy   5. Obesity, Class III, BMI 40-49.9 (morbid obesity) ([place])      Plan: Discharge home with labor precautions and referral to [facility] for outpatient prenatal follow-up prenatal care and blood work. - If urine culture is positive, f/u with patient for abx treatment - Continue prenatal vitamins daily - Start 81 mg aspirin daily given risk factors for preeclampsia  Allergies as of 02/08/2020   No Known Allergies     Medication List    TAKE these medications   albuterol 108 (90 Base) MCG/ACT inhaler Commonly known as: VENTOLIN HFA Inhale 1-2 puffs into the lungs every 6 (six) hours as needed for wheezing or shortness of breath.   aspirin EC 81 MG tablet Take 1 tablet (81 mg total) by mouth daily. Take after 12 weeks for prevention of preeclampsia later in pregnancy    cetirizine 10 MG tablet Commonly known as: ZyrTEC Allergy Take 1 tablet (10 mg total) by mouth daily.   multivitamin-prenatal 27-0.8 MG Tabs tablet Take 1 tablet by mouth daily at 12 noon. What changed: Another medication with the same name was added. Make sure you understand how and when to take each.   PrePLUS 27-1 MG Tabs Take 1 tablet by mouth daily. What changed: You were already taking a medication with the same name, and this prescription was added. Make sure you understand how and when to take each.   triamcinolone cream 0.1 % Commonly known as: KENALOG Apply 1 application topically 2 (two) times daily.      Faris Almubaslat, Ms3 02/08/2020 12:00 PM  I confirm that I have verified the information documented in the medical student's note and that I have also personally reperformed the history, physical exam and all medical decision making activities of this service and have verified that all service and findings are accurately documented in this student's note.   --No concerning findings on physical exam --Tobacco cessation encouraged --S/p phone consult with Dr. Donalee Citrin, MFM --Message sent to [facility] to coordinate New Ob appointment  S. Jeronimo Greaves, [place] 02/08/2020 12:47 PM

## 2020-02-08 NOTE — Discharge Instructions (Signed)
Second Trimester of Pregnancy  The second trimester is from week 14 through week 27 (month 4 through 6). This is often the time in pregnancy that you feel your best. Often times, morning sickness has lessened or quit. You may have more energy, and you may get hungry more often. Your unborn baby is growing rapidly. At the end of the sixth month, he or she is about 9 inches long and weighs about 1 pounds. You will likely feel the baby move between 18 and 20 weeks of pregnancy. Follow these instructions at home: Medicines  Take over-the-counter and prescription medicines only as told by your doctor. Some medicines are safe and some medicines are not safe during pregnancy.  Take a prenatal vitamin that contains at least 600 micrograms (mcg) of folic acid.  If you have trouble pooping (constipation), take medicine that will make your stool soft (stool softener) if your doctor approves. Eating and drinking   Eat regular, healthy meals.  Avoid raw meat and uncooked cheese.  If you get low calcium from the food you eat, talk to your doctor about taking a daily calcium supplement.  Avoid foods that are high in fat and sugars, such as fried and sweet foods.  If you feel sick to your stomach (nauseous) or throw up (vomit): ? Eat 4 or 5 small meals a day instead of 3 large meals. ? Try eating a few soda crackers. ? Drink liquids between meals instead of during meals.  To prevent constipation: ? Eat foods that are high in fiber, like fresh fruits and vegetables, whole grains, and beans. ? Drink enough fluids to keep your pee (urine) clear or pale yellow. Activity  Exercise only as told by your doctor. Stop exercising if you start to have cramps.  Do not exercise if it is too hot, too humid, or if you are in a place of great height (high altitude).  Avoid heavy lifting.  Wear low-heeled shoes. Sit and stand up straight.  You can continue to have sex unless your doctor tells you not  to. Relieving pain and discomfort  Wear a good support bra if your breasts are tender.  Take warm water baths (sitz baths) to soothe pain or discomfort caused by hemorrhoids. Use hemorrhoid cream if your doctor approves.  Rest with your legs raised if you have leg cramps or low back pain.  If you develop puffy, bulging veins (varicose veins) in your legs: ? Wear support hose or compression stockings as told by your doctor. ? Raise (elevate) your feet for 15 minutes, 3-4 times a day. ? Limit salt in your food. Prenatal care  Write down your questions. Take them to your prenatal visits.  Keep all your prenatal visits as told by your doctor. This is important. Safety  Wear your seat belt when driving.  Make a list of emergency phone numbers, including numbers for family, friends, the hospital, and police and fire departments. General instructions  Ask your doctor about the right foods to eat or for help finding a counselor, if you need these services.  Ask your doctor about local prenatal classes. Begin classes before month 6 of your pregnancy.  Do not use hot tubs, steam rooms, or saunas.  Do not douche or use tampons or scented sanitary pads.  Do not cross your legs for long periods of time.  Visit your dentist if you have not done so. Use a soft toothbrush to brush your teeth. Floss gently.  Avoid all smoking, herbs,   and alcohol. Avoid drugs that are not approved by your doctor.  Do not use any products that contain nicotine or tobacco, such as cigarettes and e-cigarettes. If you need help quitting, ask your doctor.  Avoid cat litter boxes and soil used by cats. These carry germs that can cause birth defects in the baby and can cause a loss of your baby (miscarriage) or stillbirth. Contact a doctor if:  You have mild cramps or pressure in your lower belly.  You have pain when you pee (urinate).  You have bad smelling fluid coming from your vagina.  You continue to  feel sick to your stomach (nauseous), throw up (vomit), or have watery poop (diarrhea).  You have a nagging pain in your belly area.  You feel dizzy. Get help right away if:  You have a fever.  You are leaking fluid from your vagina.  You have spotting or bleeding from your vagina.  You have severe belly cramping or pain.  You lose or gain weight rapidly.  You have trouble catching your breath and have chest pain.  You notice sudden or extreme puffiness (swelling) of your face, hands, ankles, feet, or legs.  You have not felt the baby move in over an hour.  You have severe headaches that do not go away when you take medicine.  You have trouble seeing. Summary  The second trimester is from week 14 through week 27 (months 4 through 6). This is often the time in pregnancy that you feel your best.  To take care of yourself and your unborn baby, you will need to eat healthy meals, take medicines only if your doctor tells you to do so, and do activities that are safe for you and your baby.  Call your doctor if you get sick or if you notice anything unusual about your pregnancy. Also, call your doctor if you need help with the right food to eat, or if you want to know what activities are safe for you. This information is not intended to replace advice given to you by your health care provider. Make sure you discuss any questions you have with your health care provider. Document Revised: 03/27/2019 Document Reviewed: 01/08/2017 Elsevier Patient Education  2020 [employer].   Prenatal Care Prenatal care is health care during pregnancy. It helps you and your unborn baby (fetus) stay as healthy as possible. Prenatal care may be provided by a midwife, a family practice health care provider, or a childbirth and pregnancy specialist (obstetrician). How does this affect me? During pregnancy, you will be closely monitored for any new conditions that might develop. To lower your risk of  pregnancy complications, you and your health care provider will talk about any underlying conditions you have. How does this affect my baby? Early and consistent prenatal care increases the chance that your baby will be healthy during pregnancy. Prenatal care lowers the risk that your baby will be:  Born early (prematurely).  Smaller than expected at birth (small for gestational age). What can I expect at the first prenatal care visit? Your first prenatal care visit will likely be the longest. You should schedule your first prenatal care visit as soon as you know that you are pregnant. Your first visit is a good time to talk about any questions or concerns you have about pregnancy. At your visit, you and your health care provider will talk about:  Your medical history, including: ? Any past pregnancies. ? Your family's medical history. ? The  baby's father's medical history. ? Any long-term (chronic) health conditions you have and how you manage them. ? Any surgeries or procedures you have had. ? Any current over-the-counter or prescription medicines, herbs, or supplements you are taking.  Other factors that could pose a risk to your baby, including:  Your home setting and your stress levels, including: ? Exposure to abuse or violence. ? Household financial strain. ? Mental health conditions you have.  Your daily health habits, including diet and exercise. Your health care provider will also:  Measure your weight, height, and blood pressure.  Do a physical exam, including a pelvic and breast exam.  Perform blood tests and urine tests to check for: ? Urinary tract infection. ? Sexually transmitted infections (STIs). ? Low iron levels in your blood (anemia). ? Blood type and certain proteins on red blood cells (Rh antibodies). ? Infections and immunity to viruses, such as hepatitis B and rubella. ? HIV (human immunodeficiency virus).  Do an ultrasound to confirm your baby's  growth and development and to help predict your estimated due date (EDD). This ultrasound is done with a probe that is inserted into the vagina (transvaginal ultrasound).  Discuss your options for genetic screening.  Give you information about how to keep yourself and your baby healthy, including: ? Nutrition and taking vitamins. ? Physical activity. ? How to manage pregnancy symptoms such as nausea and vomiting (morning sickness). ? Infections and substances that may be harmful to your baby and how to avoid them. ? Food safety. ? Dental care. ? Working. ? Travel. ? Warning signs to watch for and when to call your health care provider. How often will I have prenatal care visits? After your first prenatal care visit, you will have regular visits throughout your pregnancy. The visit schedule is often as follows:  Up to week 28 of pregnancy: once every 4 weeks.  28-36 weeks: once every 2 weeks.  After 36 weeks: every week until delivery. Some women may have visits more or less often depending on any underlying health conditions and the health of the baby. Keep all follow-up and prenatal care visits as told by your health care provider. This is important. What happens during routine prenatal care visits? Your health care provider will:  Measure your weight and blood pressure.  Check for fetal heart sounds.  Measure the height of your uterus in your abdomen (fundal height). This may be measured starting around week 20 of pregnancy.  Check the position of your baby inside your uterus.  Ask questions about your diet, sleeping patterns, and whether you can feel the baby move.  Review warning signs to watch for and signs of labor.  Ask about any pregnancy symptoms you are having and how you are dealing with them. Symptoms may include: ? Headaches. ? Nausea and vomiting. ? Vaginal discharge. ? Swelling. ? Fatigue. ? Constipation. ? Any discomfort, including back or pelvic  pain. Make a list of questions to ask your health care provider at your routine visits. What tests might I have during prenatal care visits? You may have blood, urine, and imaging tests throughout your pregnancy, such as:  Urine tests to check for glucose, protein, or signs of infection.  Glucose tests to check for a form of diabetes that can develop during pregnancy (gestational diabetes mellitus). This is usually done around week 24 of pregnancy.  An ultrasound to check your baby's growth and development and to check for birth defects. This is usually done around  week 20 of pregnancy.  A test to check for group B strep (GBS) infection. This is usually done around week 36 of pregnancy.  Genetic testing. This may include blood or imaging tests, such as an ultrasound. Some genetic tests are done during the first trimester and some are done during the second trimester. What else can I expect during prenatal care visits? Your health care provider may recommend getting certain vaccines during pregnancy. These may include:  A yearly flu shot (annual influenza vaccine). This is especially important if you will be pregnant during flu season.  Tdap (tetanus, diphtheria, pertussis) vaccine. Getting this vaccine during pregnancy can protect your baby from whooping cough (pertussis) after birth. This vaccine may be recommended between weeks 27 and 36 of pregnancy. Later in your pregnancy, your health care provider may give you information about:  Childbirth and breastfeeding classes.  Choosing a health care provider for your baby.  Umbilical cord banking.  Breastfeeding.  Birth control after your baby is born.  The hospital labor and delivery unit and how to tour it.  Registering at the hospital before you go into labor. Where to find more information  Office on Women's Health: TravelLesson.ca  American Pregnancy Association: americanpregnancy.org  March of Dimes:  marchofdimes.org Summary  Prenatal care helps you and your baby stay as healthy as possible during pregnancy.  Your first prenatal care visit will most likely be the longest.  You will have visits and tests throughout your pregnancy to monitor your health and your baby's health.  Bring a list of questions to your visits to ask your health care provider.  Make sure to keep all follow-up and prenatal care visits with your health care provider. This information is not intended to replace advice given to you by your health care provider. Make sure you discuss any questions you have with your health care provider. Document Revised: 03/25/2019 Document Reviewed: 12/02/2017 Elsevier Patient Education  2020 [employer].  Safe Medications in Pregnancy   Acne: Benzoyl Peroxide Salicylic Acid  Backache/Headache: Tylenol: 2 regular strength every 4 hours OR              2 Extra strength every 6 hours  Colds/Coughs/Allergies: Benadryl (alcohol free) 25 mg every 6 hours as needed Breath right strips Claritin Cepacol throat lozenges Chloraseptic throat spray Cold-Eeze- up to three times per day Cough drops, alcohol free Flonase (by prescription only) Guaifenesin Mucinex Robitussin DM (plain only, alcohol free) Saline nasal spray/drops Sudafed (pseudoephedrine) & Actifed ** use only after [redacted] weeks gestation and if you do not have high blood pressure Tylenol Vicks Vaporub Zinc lozenges Zyrtec   Constipation: Colace Ducolax suppositories Fleet enema Glycerin suppositories Metamucil Milk of magnesia Miralax Senokot Smooth move tea  Diarrhea: Kaopectate Imodium A-D  *NO pepto Bismol  Hemorrhoids: Anusol Anusol HC Preparation H Tucks  Indigestion: Tums Maalox Mylanta Zantac  Pepcid  Insomnia: Benadryl (alcohol free) 25mg  every 6 hours as needed Tylenol PM Unisom, no Gelcaps  Leg Cramps: Tums MagGel  Nausea/Vomiting:  Bonine Dramamine Emetrol Ginger  extract Sea bands Meclizine  Nausea medication to take during pregnancy:  Unisom (doxylamine succinate 25 mg tablets) Take one tablet daily at bedtime. If symptoms are not adequately controlled, the dose can be increased to a maximum recommended dose of two tablets daily (1/2 tablet in the morning, 1/2 tablet mid-afternoon and one at bedtime). Vitamin B6 100mg  tablets. Take one tablet twice a day (up to 200 mg per day).  Skin Rashes: Aveeno products  Benadryl cream or 25mg  every 6 hours as needed Calamine Lotion 1% cortisone cream  Yeast infection: Gyne-lotrimin 7 Monistat 7   **If taking multiple medications, please check labels to avoid duplicating the same active ingredients **take medication as directed on the label ** Do not exceed 4000 mg of tylenol in 24 hours **Do not take medications that contain aspirin or ibuprofen

## 2020-02-08 NOTE — MAU Note (Signed)
Presents with c/o abdominal pain that began 1 month ago.  Denies VB.  States wants to know how far along she is.  LMP October 2020.  Reports no PNC, had pregnancy confirmed @ [facility].

## 2020-02-09 ENCOUNTER — Telehealth: Payer: Self-pay | Admitting: General Practice

## 2020-02-09 LAB — CULTURE, OB URINE: Culture: NO GROWTH

## 2020-02-09 LAB — GC/CHLAMYDIA PROBE AMP (~~LOC~~) NOT AT ARMC
Chlamydia: NEGATIVE
Comment: NEGATIVE
Comment: NORMAL
Neisseria Gonorrhea: NEGATIVE

## 2020-02-09 NOTE — Telephone Encounter (Signed)
-----   Message from Clovis Pu, RN sent at 02/08/2020  1:56 PM EST ----- Regarding: FW: New OB  ----- Message ----- From: Calvert Cantor, CNM Sent: 02/08/2020   1:44 PM EST To: Clovis Pu, RN Subject: New OB                                         Openings for New OB? Patient is 21 weeks.  Thanks! SW

## 2020-02-09 NOTE — Telephone Encounter (Signed)
Called patient to schedule NOB.  Left message with pt's father to give our office a call at her earliest convenience.

## 2020-03-09 ENCOUNTER — Encounter: Payer: Medicaid Other | Admitting: Obstetrics & Gynecology

## 2020-03-09 DIAGNOSIS — O09899 Supervision of other high risk pregnancies, unspecified trimester: Secondary | ICD-10-CM

## 2020-03-09 HISTORY — DX: Supervision of other high risk pregnancies, unspecified trimester: O09.899

## 2020-03-12 IMAGING — US US MFM OB DETAIL+14 WK
1 series · 13 of 28 positions shown · non-contrast
Comparison: none

[Series 1: us mfm ob detail+14 wk · 13 of 59 slices shown]
[im 3/59]
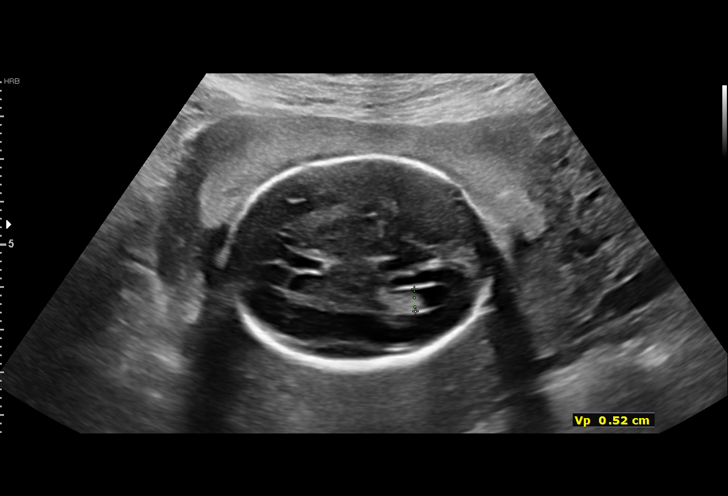
[im 7/59]
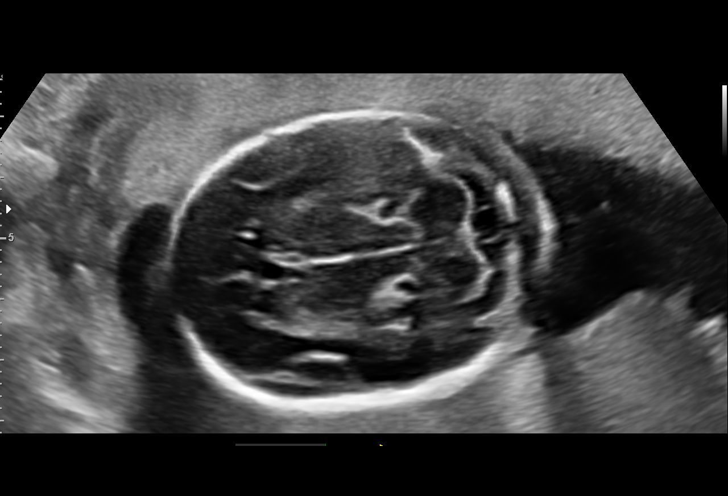
[im 11/59]
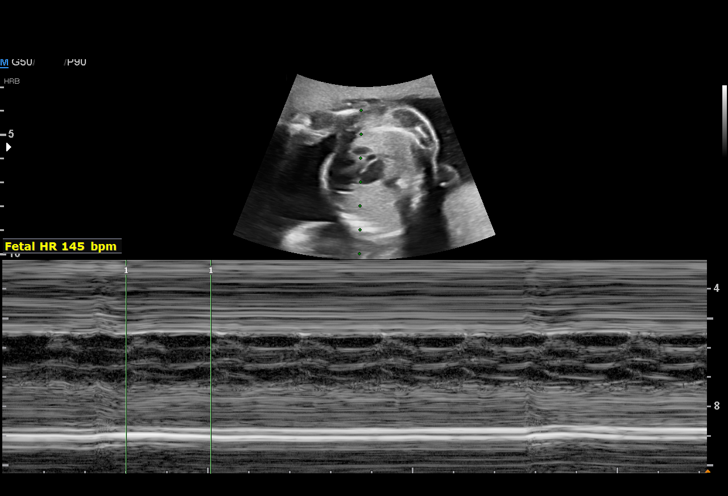
[im 16/59]
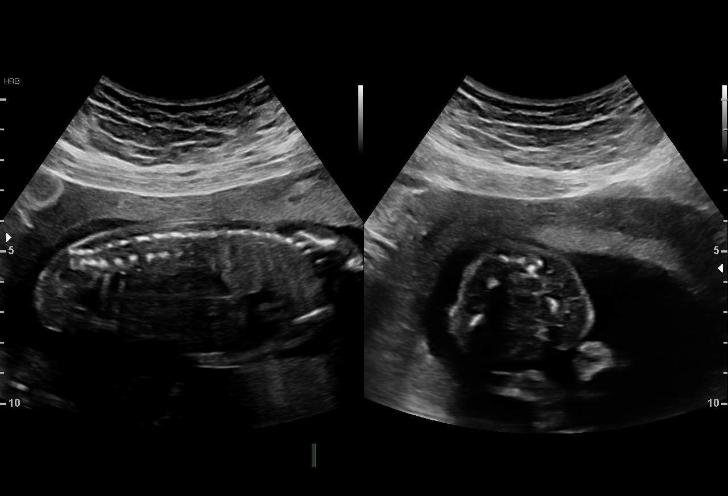
[im 20/59]
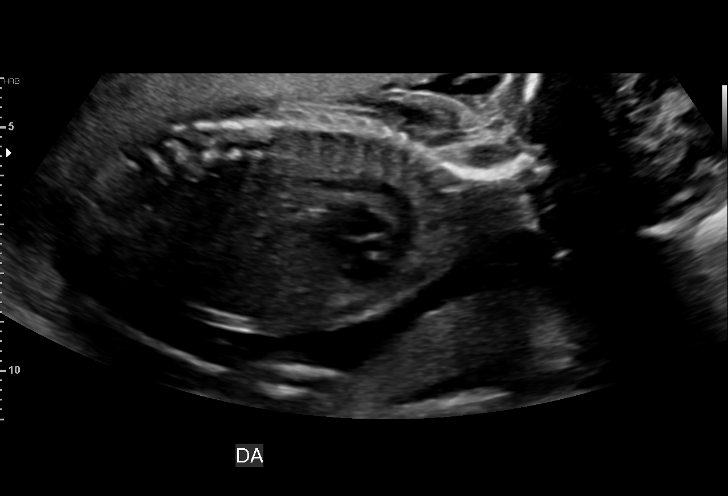
[im 24/59]
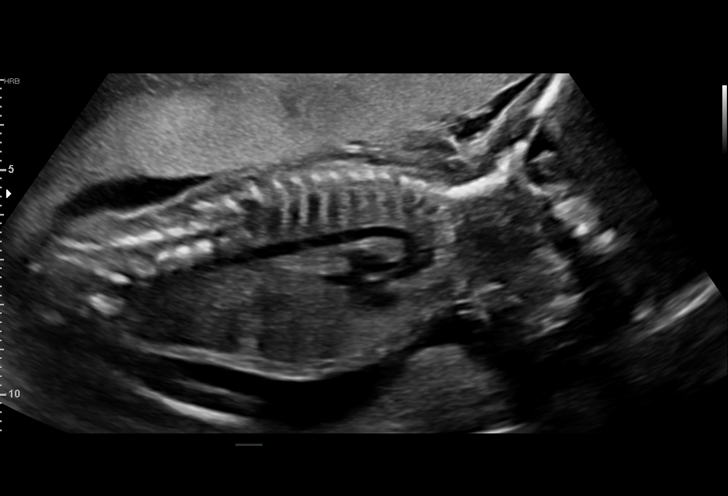
[im 31/59]
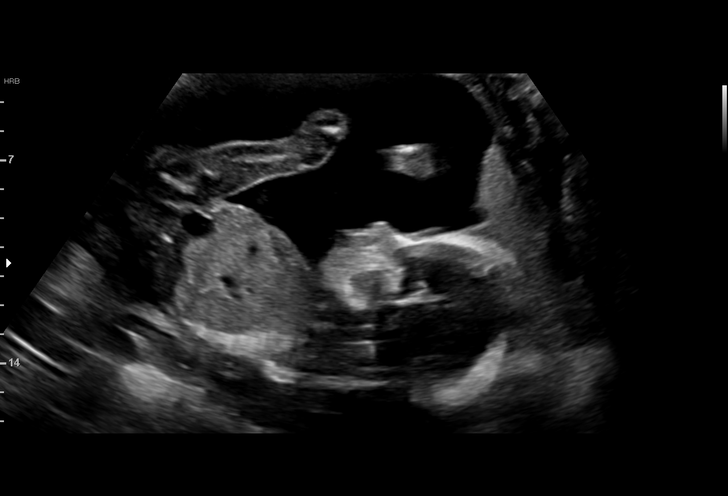
[im 35/59]
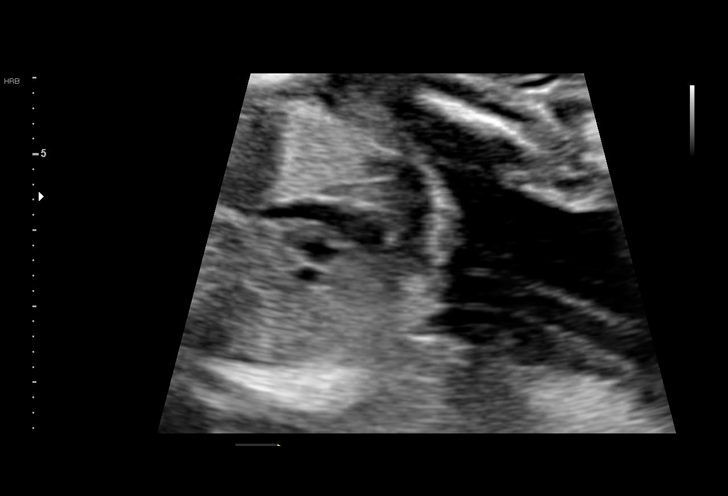
[im 39/59]
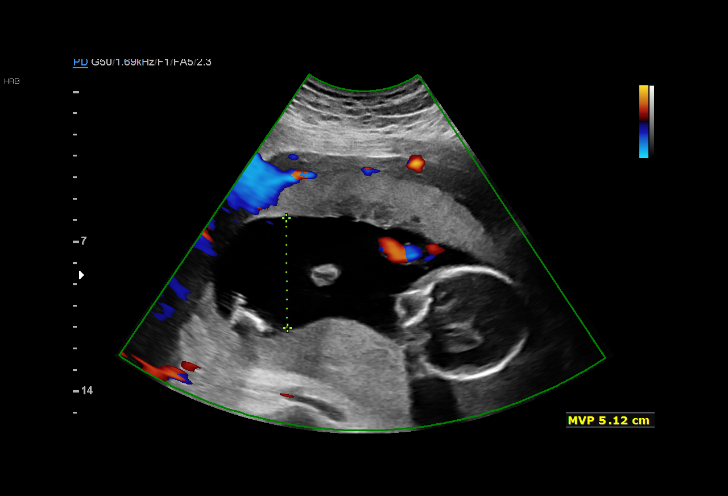
[im 43/59]
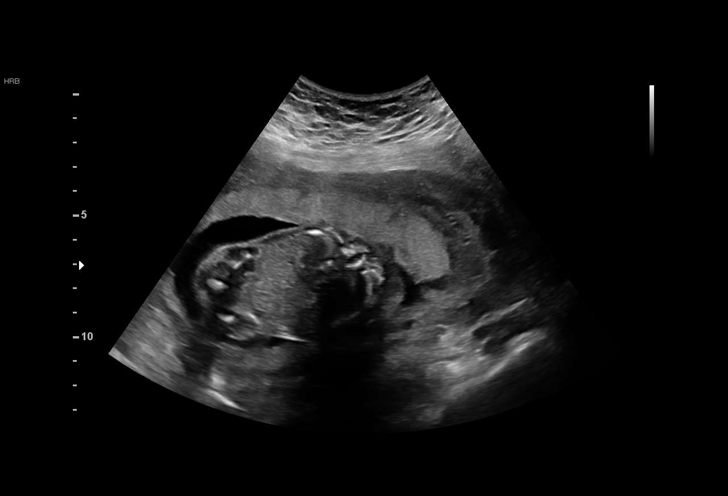
[im 48/59]
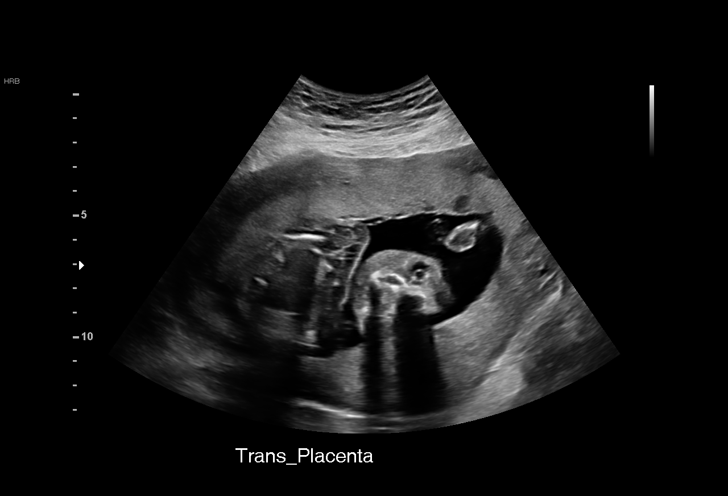
[im 52/59]
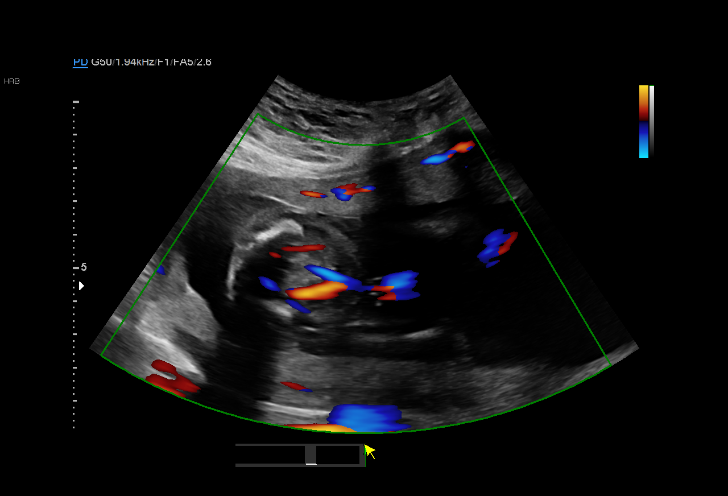
[im 56/59]
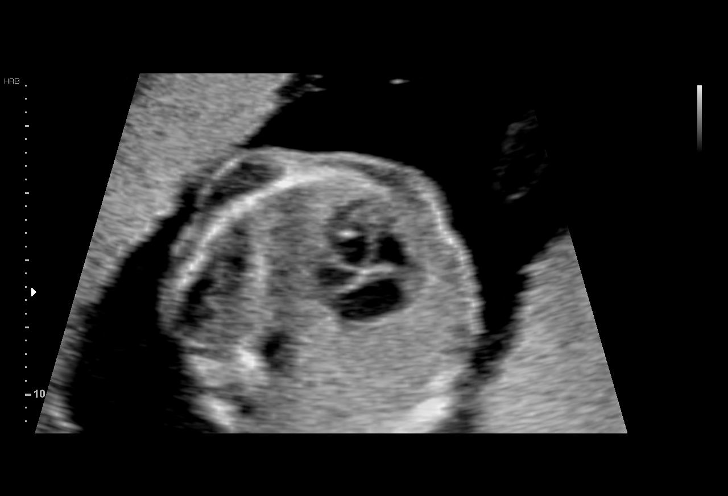

[13 of 28 positions shown; findings below may reference images not displayed]

1  US MFM OB DETAIL +14 WK              76811.01     NILAM
                                                       CLEOPATRA
 ----------------------------------------------------------------------

 ----------------------------------------------------------------------
Indications

  Encounter for antenatal screening for
  malformations
  Echogenic intracardiac focus of the heart
  (EIF)
  Encounter for uncertain dates
  Abdominal pain in pregnancy (x1 month)
  21 weeks gestation of pregnancy
  Obesity complicating pregnancy, second
  trimester (BMI 41)
  Poor obstetric history: Previous preterm
  delivery, antepartum x2
  Poor obstetric history: Previous neonatal
  death - preterm
 ----------------------------------------------------------------------
Fetal Evaluation

 Num Of Fetuses:         1
 Fetal Heart Rate(bpm):  145
 Cardiac Activity:       Observed
 Presentation:           Cephalic
 Placenta:               Anterior
 P. Cord Insertion:      Visualized, central

 Amniotic Fluid
 AFI FV:      Within normal limits

                             Largest Pocket(cm)

Biometry

 BPD:      48.5  mm     G. Age:  20w 5d         35  %    CI:        70.47   %    70 - 86
                                                         FL/HC:      19.8   %    15.9 -
 HC:      184.2  mm     G. Age:  20w 5d         31  %    HC/AC:      1.17        1.06 -
 AC:      157.1  mm     G. Age:  20w 6d         38  %    FL/BPD:     75.3   %
 FL:       36.5  mm     G. Age:  21w 4d         62  %    FL/AC:      23.2   %    20 - 24
 CER:      21.5  mm     G. Age:  20w 4d         39  %
 NFT:       4.9  mm

 LV:        5.2  mm
 CM:        5.1  mm

 Est. FW:     402  gm    0 lb 14 oz      52  %
OB History

 Gravidity:    4         Prem:   2
Gestational Age

 U/S Today:     21w 0d                                        EDD:   06/20/20
 Best:          21w 0d     Det. By:  U/S (02/08/20)           EDD:   06/20/20
Anatomy

 Cranium:               Appears normal         Aortic Arch:            Appears normal
 Cavum:                 Appears normal         Ductal Arch:            Appears normal
 Ventricles:            Appears normal         Diaphragm:              Appears normal
 Choroid Plexus:        Appears normal         Stomach:                Appears normal, left
                                                                       sided
 Cerebellum:            Appears normal         Abdomen:                Appears normal
 Posterior Fossa:       Appears normal         Abdominal Wall:         Appears nml (cord
                                                                       insert, abd wall)
 Nuchal Fold:           Appears normal         Cord Vessels:           Appears normal (3
                                                                       vessel cord)
 Face:                  Orbits nl; profile not Kidneys:                Appear normal
                        well visualized
 Lips:                  Appears normal         Bladder:                Appears normal
 Thoracic:              Appears normal         Spine:                  Appears normal
 Heart:                 Echogenic foci in      Upper Extremities:      Appears normal
                        LV, not well vis.
 RVOT:                  Appears normal         Lower Extremities:      Appears normal
 LVOT:                  Appears normal

 Other:  Heels visualized. Technically difficult due to maternal habitus and
         fetal position.
Cervix Uterus Adnexa

 Cervix
 Length:           3.25  cm.
 Normal appearance by transabdominal scan.
Impression

 Patient was evaluated the [facility] for complaints of abdominal
 pain.  She does not have an established EDD.
 We performed a fetal anatomical survey.  The fetal biometry
 is consistent with 21 weeks gestation.  EDD is assigned at
 06/20/2020.  Amniotic fluid is normal good fetal activity seen.
 An echogenic focus is seen in the left ventricle.  No other
 markers of aneuploidies or fetal structural defects are seen.
 Fetal anatomy, otherwise, appears normal.

 I spoke with the patient on phone.  I explained that echogenic
 intracardiac focus is seen in about 3% to 4% of normal
 fetuses and also seen in some fetuses with Down syndrome.
 I also explained that only amniocentesis will give a definitive
 result on the fetal karyotype.
 I encouraged her to initiate her prenatal care and undergo
 screening for Down syndrome.
 Informed [facility] team.
Recommendations

 -Follow-up scan to complete anatomy in 4 weeks (no
 appointments were made).
                 Delowr, Auntyjatty

## 2020-04-21 ENCOUNTER — Encounter: Payer: Self-pay | Admitting: Obstetrics

## 2020-04-21 ENCOUNTER — Ambulatory Visit (INDEPENDENT_AMBULATORY_CARE_PROVIDER_SITE_OTHER): Payer: Medicaid Other | Admitting: Obstetrics

## 2020-04-21 ENCOUNTER — Other Ambulatory Visit: Payer: Self-pay

## 2020-04-21 VITALS — BP 129/85 | HR 108 | Wt 229.0 lb

## 2020-04-21 DIAGNOSIS — Z3A31 31 weeks gestation of pregnancy: Secondary | ICD-10-CM | POA: Diagnosis not present

## 2020-04-21 DIAGNOSIS — O34219 Maternal care for unspecified type scar from previous cesarean delivery: Secondary | ICD-10-CM

## 2020-04-21 DIAGNOSIS — Z3689 Encounter for other specified antenatal screening: Secondary | ICD-10-CM

## 2020-04-21 DIAGNOSIS — O99613 Diseases of the digestive system complicating pregnancy, third trimester: Secondary | ICD-10-CM

## 2020-04-21 DIAGNOSIS — O283 Abnormal ultrasonic finding on antenatal screening of mother: Secondary | ICD-10-CM

## 2020-04-21 DIAGNOSIS — O0933 Supervision of pregnancy with insufficient antenatal care, third trimester: Secondary | ICD-10-CM | POA: Diagnosis not present

## 2020-04-21 DIAGNOSIS — K5900 Constipation, unspecified: Secondary | ICD-10-CM

## 2020-04-21 DIAGNOSIS — O0932 Supervision of pregnancy with insufficient antenatal care, second trimester: Secondary | ICD-10-CM

## 2020-04-21 DIAGNOSIS — O09899 Supervision of other high risk pregnancies, unspecified trimester: Secondary | ICD-10-CM

## 2020-04-21 MED ORDER — VITAFOL ULTRA 29-0.6-0.4-200 MG PO CAPS: 1.0000 | ORAL_CAPSULE | Freq: Every day | ORAL | 4 refills | Status: DC

## 2020-04-21 MED ORDER — DOCUSATE SODIUM 100 MG PO CAPS: 100.0000 mg | ORAL_CAPSULE | Freq: Two times a day (BID) | ORAL | 5 refills | Status: DC

## 2020-04-21 NOTE — Progress Notes (Signed)
Subjective:    [name] is being seen today for her first obstetrical visit.  This is not a planned pregnancy. She is at [redacted]w[redacted]d gestation. Her obstetrical history is significant for non-compliance, obesity, pre-eclampsia and smoker. Relationship with FOB: significant other, not living together. Patient does intend to breast feed. Pregnancy history fully reviewed.  The information documented in the HPI was reviewed and verified.  Menstrual History: OB History    Gravida  4   Para  3   Term  0   Preterm  2   AB      Living  2     SAB      TAB      Ectopic      Multiple  1   Live Births  3            No LMP recorded (lmp unknown). Patient is pregnant.    Past Medical History:  Diagnosis Date  . Asthma   . Obesity     Past Surgical History:  Procedure Laterality Date  . BREAST BIOPSY Left 07/31/2013   Procedure: IRRIGATION AND DEBRIDMENT LEFT BREAST ABSCESS;  Surgeon: Lodema Pilot, DO;  Location: [facility];  Service: General;  Laterality: Left;  IRRIGATION AND DEBRIDEMENT LEFT BREAST ABSCESS  . CESAREAN SECTION      (Not in a hospital admission)  No Known Allergies  Social History   Tobacco Use  . Smoking status: Former Smoker    Packs/day: 1.00    Types: Cigarettes  . Smokeless tobacco: Never Used  . Tobacco comment: rare   Substance Use Topics  . Alcohol use: No    Family History  Problem Relation Age of Onset  . Diabetes Father      Review of Systems Constitutional: negative for weight loss Gastrointestinal: negative for vomiting Genitourinary:negative for genital lesions and vaginal discharge and dysuria Musculoskeletal:negative for back pain Behavioral/Psych: negative for abusive relationship, depression, illegal drug usage and tobacco use    Objective:    BP 129/85   Pulse (!) 108   Wt 229 lb (103.9 kg)   LMP  (LMP Unknown) Comment: LMP October 2020  BMI 43.27 kg/m  General Appearance:    Alert, cooperative, no distress,  appears stated age  Head:    Normocephalic, without obvious abnormality, atraumatic  Eyes:    PERRL, conjunctiva/corneas clear, EOM's intact, fundi    benign, both eyes  Ears:    Normal TM's and external ear canals, both ears  Nose:   Nares normal, septum midline, mucosa normal, no drainage    or sinus tenderness  Throat:   Lips, mucosa, and tongue normal; teeth and gums normal  Neck:   Supple, symmetrical, trachea midline, no adenopathy;    thyroid:  no enlargement/tenderness/nodules; no carotid   bruit or JVD  Back:     Symmetric, no curvature, ROM normal, no CVA tenderness  Lungs:     Clear to auscultation bilaterally, respirations unlabored  Chest Wall:    No tenderness or deformity   Heart:    Regular rate and rhythm, S1 and S2 normal, no murmur, rub   or gallop  Breast Exam:    No tenderness, masses, or nipple abnormality  Abdomen:     Soft, non-tender, bowel sounds active all four quadrants,    no masses, no organomegaly  Genitalia:    Normal female without lesion, discharge or tenderness  Extremities:   Extremities normal, atraumatic, no cyanosis or edema  Pulses:   2+  and symmetric all extremities  Skin:   Skin color, texture, turgor normal, no rashes or lesions  Lymph nodes:   Cervical, supraclavicular, and axillary nodes normal  Neurologic:   CNII-XII intact, normal strength, sensation and reflexes    throughout      Lab Review Urine pregnancy test Labs reviewed yes Radiologic studies reviewed yes  Assessment:    Pregnancy at [redacted]w[redacted]d weeks    Plan:     1. Supervision of other high risk pregnancy, antepartum Rx: - Obstetric Panel, Including HIV - Hepatitis C antibody  2. No prenatal care in current pregnancy in second trimester  3. Hx of preterm delivery, currently pregnant  4. Fetal echogenic intracardiac focus on prenatal ultrasound Rx:  5. Encounter for ultrasound to assess interval growth of fetus Rx: - [place] [facility] OB FOLLOW UP; Future  6. Constipation  during pregnancy in third trimester Rx: - docusate sodium (COLACE) 100 MG capsule; Take 1 capsule (100 mg total) by mouth 2 (two) times daily.  Dispense: 60 capsule; Refill: 5 - Prenat-Fe Poly-Methfol-FA-DHA (VITAFOL ULTRA) 29-0.6-0.4-200 MG CAPS; Take 1 capsule by mouth daily before breakfast.  Dispense: 90 capsule; Refill: 4   Prenatal vitamins.  Counseling provided regarding continued use of seat belts, cessation of alcohol consumption, smoking or use of illicit drugs; infection precautions i.e., influenza/TDAP immunizations, toxoplasmosis,CMV, parvovirus, listeria and varicella; workplace safety, exercise during pregnancy; routine dental care, safe medications, sexual activity, hot tubs, saunas, pools, travel, caffeine use, fish and methlymercury, potential toxins, hair treatments, varicose veins Weight gain recommendations per IOM guidelines reviewed: underweight/BMI< 18.5--> gain 28 - 40 lbs; normal weight/BMI 18.5 - 24.9--> gain 25 - 35 lbs; overweight/BMI 25 - 29.9--> gain 15 - 25 lbs; obese/BMI >30->gain  11 - 20 lbs Problem list reviewed and updated. FIRST/CF mutation testing/NIPT/QUAD SCREEN/fragile X/Ashkenazi Jewish population testing/Spinal muscular atrophy discussed: yes Role of ultrasound in pregnancy discussed; fetal survey: requested. Amniocentesis discussed: not indicated.  Meds ordered this encounter  Medications  . docusate sodium (COLACE) 100 MG capsule    Sig: Take 1 capsule (100 mg total) by mouth 2 (two) times daily.    Dispense:  60 capsule    Refill:  5  . Prenat-Fe Poly-Methfol-FA-DHA (VITAFOL ULTRA) 29-0.6-0.4-200 MG CAPS    Sig: Take 1 capsule by mouth daily before breakfast.    Dispense:  90 capsule    Refill:  4   Orders Placed This Encounter  Procedures  . [place] [facility] OB FOLLOW UP    Standing Status:   Future    Standing Expiration Date:   06/21/2021    Order Specific Question:   Reason for Exam (SYMPTOM  OR DIAGNOSIS REQUIRED)    Answer:   Interval growth     Order Specific Question:   Preferred Location    Answer:   [facility]  . Obstetric Panel, Including HIV  . Hepatitis C antibody    Follow up in 2 weeks. 50% of 25 min visit spent on counseling and coordination of care.    Shelly Bombard, MD 04/21/2020 3:39 PM

## 2020-04-22 LAB — OBSTETRIC PANEL, INCLUDING HIV
Antibody Screen: NEGATIVE
Basophils Absolute: 0 10*3/uL (ref 0.0–0.2)
Basos: 0 %
EOS (ABSOLUTE): 0.3 10*3/uL (ref 0.0–0.4)
Eos: 2 %
HIV Screen 4th Generation wRfx: NONREACTIVE
Hematocrit: 32.1 % — ABNORMAL LOW (ref 34.0–46.6)
Hemoglobin: 11.4 g/dL (ref 11.1–15.9)
Hepatitis B Surface Ag: NEGATIVE
Immature Grans (Abs): 0.1 10*3/uL (ref 0.0–0.1)
Immature Granulocytes: 1 %
Lymphocytes Absolute: 3.4 10*3/uL — ABNORMAL HIGH (ref 0.7–3.1)
Lymphs: 21 %
MCH: 33.4 pg — ABNORMAL HIGH (ref 26.6–33.0)
MCHC: 35.5 g/dL (ref 31.5–35.7)
MCV: 94 fL (ref 79–97)
Monocytes Absolute: 0.9 10*3/uL (ref 0.1–0.9)
Monocytes: 6 %
Neutrophils Absolute: 11.6 10*3/uL — ABNORMAL HIGH (ref 1.4–7.0)
Neutrophils: 70 %
Platelets: 321 10*3/uL (ref 150–450)
RBC: 3.41 x10E6/uL — ABNORMAL LOW (ref 3.77–5.28)
RDW: 12.4 % (ref 11.7–15.4)
RPR Ser Ql: NONREACTIVE
Rh Factor: POSITIVE
Rubella Antibodies, IGG: <0.9 index — ABNORMAL LOW (ref >0.99)
WBC: 16.3 10*3/uL — ABNORMAL HIGH (ref 3.4–10.8)

## 2020-04-22 LAB — HEPATITIS C ANTIBODY: Hep C Virus Ab: <0.1 s/co ratio (ref 0.0–0.9)

## 2020-04-26 ENCOUNTER — Ambulatory Visit: Payer: Medicaid Other | Admitting: *Deleted

## 2020-04-26 ENCOUNTER — Other Ambulatory Visit: Payer: Self-pay | Admitting: *Deleted

## 2020-04-26 ENCOUNTER — Other Ambulatory Visit: Payer: Self-pay

## 2020-04-26 ENCOUNTER — Ambulatory Visit: Payer: Medicaid Other | Attending: Obstetrics and Gynecology

## 2020-04-26 DIAGNOSIS — Z8751 Personal history of pre-term labor: Secondary | ICD-10-CM

## 2020-04-26 DIAGNOSIS — O358XX0 Maternal care for other (suspected) fetal abnormality and damage, not applicable or unspecified: Secondary | ICD-10-CM

## 2020-04-26 DIAGNOSIS — O09213 Supervision of pregnancy with history of pre-term labor, third trimester: Secondary | ICD-10-CM

## 2020-04-26 DIAGNOSIS — O0932 Supervision of pregnancy with insufficient antenatal care, second trimester: Secondary | ICD-10-CM | POA: Insufficient documentation

## 2020-04-26 DIAGNOSIS — O09899 Supervision of other high risk pregnancies, unspecified trimester: Secondary | ICD-10-CM | POA: Insufficient documentation

## 2020-04-26 DIAGNOSIS — O0933 Supervision of pregnancy with insufficient antenatal care, third trimester: Secondary | ICD-10-CM | POA: Diagnosis not present

## 2020-04-26 DIAGNOSIS — Z3689 Encounter for other specified antenatal screening: Secondary | ICD-10-CM | POA: Diagnosis not present

## 2020-04-26 DIAGNOSIS — O09293 Supervision of pregnancy with other poor reproductive or obstetric history, third trimester: Secondary | ICD-10-CM | POA: Diagnosis not present

## 2020-04-26 DIAGNOSIS — O99213 Obesity complicating pregnancy, third trimester: Secondary | ICD-10-CM

## 2020-04-26 DIAGNOSIS — E669 Obesity, unspecified: Secondary | ICD-10-CM

## 2020-04-26 DIAGNOSIS — Z3A32 32 weeks gestation of pregnancy: Secondary | ICD-10-CM

## 2020-05-03 ENCOUNTER — Encounter: Payer: Medicaid Other | Admitting: Obstetrics and Gynecology

## 2020-05-05 ENCOUNTER — Other Ambulatory Visit: Payer: Self-pay

## 2020-05-05 ENCOUNTER — Ambulatory Visit (INDEPENDENT_AMBULATORY_CARE_PROVIDER_SITE_OTHER): Payer: Medicaid Other | Admitting: Obstetrics & Gynecology

## 2020-05-05 ENCOUNTER — Encounter: Payer: Self-pay | Admitting: Obstetrics & Gynecology

## 2020-05-05 VITALS — BP 132/85 | HR 103 | Wt 226.8 lb

## 2020-05-05 DIAGNOSIS — O34219 Maternal care for unspecified type scar from previous cesarean delivery: Secondary | ICD-10-CM

## 2020-05-05 DIAGNOSIS — F209 Schizophrenia, unspecified: Secondary | ICD-10-CM

## 2020-05-05 DIAGNOSIS — Z98891 History of uterine scar from previous surgery: Secondary | ICD-10-CM

## 2020-05-05 DIAGNOSIS — O09899 Supervision of other high risk pregnancies, unspecified trimester: Secondary | ICD-10-CM

## 2020-05-05 DIAGNOSIS — O09893 Supervision of other high risk pregnancies, third trimester: Secondary | ICD-10-CM | POA: Diagnosis not present

## 2020-05-05 DIAGNOSIS — Z3A33 33 weeks gestation of pregnancy: Secondary | ICD-10-CM

## 2020-05-05 NOTE — Progress Notes (Signed)
   PRENATAL VISIT NOTE  Subjective:  [name] is a 34 y.o. [phone] at [redacted]w[redacted]d being seen today for ongoing prenatal care.  She is currently monitored for the following issues for this high-risk pregnancy and has Obesity, Class III, BMI 40-49.9 (morbid obesity) (HCC); Tobacco use; Hx of trichomoniasis; Depression; Bipolar disorder (HCC); Hx of cesarean section; Schizophrenia (HCC); Hx successful VBAC (vaginal birth after cesarean), currently pregnant; No prenatal care in current pregnancy in second trimester; Fetal echogenic intracardiac focus on prenatal ultrasound; and Supervision of other high risk pregnancy, antepartum on their problem list.  Patient reports occasional contractions.  Contractions: Irritability. Vag. Bleeding: None.  Movement: Present. Denies leaking of fluid.   The following portions of the patient's history were reviewed and updated as appropriate: allergies, current medications, past family history, past medical history, past social history, past surgical history and problem list.   Objective:   Vitals:   05/05/20 1004  BP: 132/85  Pulse: (!) 103  Weight: 226 lb 12.8 oz (102.9 kg)    Fetal Status: Fetal Heart Rate (bpm): 154   Movement: Present     General:  Alert, oriented and cooperative. Patient is in no acute distress.  Skin: Skin is warm and dry. No rash noted.   Cardiovascular: Normal heart rate noted  Respiratory: Normal respiratory effort, no problems with respiration noted  Abdomen: Soft, gravid, appropriate for gestational age.  Pain/Pressure: Present     Pelvic: Cervical exam deferred        Extremities: Normal range of motion.  Edema: None  Mental Status: Normal mood and affect. Normal behavior. Normal judgment and thought content.   Assessment and Plan:  Pregnancy: [id number] at [redacted]w[redacted]d 1. Supervision of other high risk pregnancy, antepartum anxiety - Ambulatory referral to [facility]  2. Hx of cesarean section VBAC  desired  3. Hx successful VBAC (vaginal birth after cesarean), currently pregnant   4. Schizophrenia, unspecified type (HCC)  - Ambulatory referral to [facility]  5. Hx of preterm delivery, currently pregnant F/u 2 weeks  Preterm labor symptoms and general obstetric precautions including but not limited to vaginal bleeding, contractions, leaking of fluid and fetal movement were reviewed in detail with the patient. Please refer to After Visit Summary for other counseling recommendations.   Return in about 2 weeks (around 05/19/2020) for needs appt with [facility].  Future Appointments  Date Time Provider Department Center  05/24/2020 11:15 AM [facility] NURSE [facility]  05/24/2020 11:15 AM [facility] US2 [facility]    Scheryl Darter, MD

## 2020-05-05 NOTE — Patient Instructions (Signed)
Vaginal Birth After Cesarean Delivery  Vaginal birth after cesarean delivery (VBAC) is giving birth vaginally after previously delivering a baby through a cesarean section (C-section). A VBAC may be a safe option for you, depending on your health and other factors. It is important to discuss VBAC with your health care provider early in your pregnancy so you can understand the risks, benefits, and options. Having these discussions early will give you time to make your birth plan. Who are the best candidates for VBAC? The best candidates for VBAC are women who:  Have had one or two prior cesarean deliveries, and the incision made during the delivery was horizontal (low transverse).  Do not have a vertical (classical) scar on their uterus.  Have not had a tear in the wall of their uterus (uterine rupture).  Plan to have more pregnancies. A VBAC is also more likely to be successful:  In women who have previously given birth vaginally.  When labor starts by itself (spontaneously) before the due date. What are the benefits of VBAC? The benefits of delivering your baby vaginally instead of by a cesarean delivery include:  A shorter hospital stay.  A faster recovery time.  Less pain.  Avoiding risks associated with major surgery, such as infection and blood clots.  Less blood loss and less need for donated blood (transfusions). What are the risks of VBAC? The main risk of attempting a VBAC is that it may fail, forcing your health care provider to deliver your baby by a C-section. Other risks are rare and include:  Tearing (rupture) of the scar from a past cesarean delivery.  Other risks associated with vaginal deliveries. If a repeat cesarean delivery is needed, the risks include:  Blood loss.  Infection.  Blood clot.  Damage to surrounding organs.  Removal of the uterus (hysterectomy), if it is damaged.  Placenta problems in future pregnancies. What else should I know  about my options? Delivering a baby through a VBAC is similar to having a normal spontaneous vaginal delivery. Therefore, it is safe:  To try with twins.  For your health care provider to try to turn the baby from a breech position (external cephalic version) during labor.  With epidural analgesia for pain relief. Consider where you would like to deliver your baby. VBAC should be attempted in facilities where an emergency cesarean delivery can be performed. VBAC is not recommended for home births. Any changes in your health or your baby's health during your pregnancy may make it necessary to change your initial decision about VBAC. Your health care provider may recommend that you do not attempt a VBAC if:  Your baby's suspected weight is 8.8 lb (4 kg) or more.  You have preeclampsia. This is a condition that causes high blood pressure along with other symptoms, such as swelling and headaches.  You will have VBAC less than 19 months after your cesarean delivery.  You are past your due date.  You need to have labor started (induced) because your cervix is not ready for labor (unfavorable). Where to find more information  American Pregnancy Association: americanpregnancy.org  American Congress of Obstetricians and Gynecologists: acog.org Summary  Vaginal birth after cesarean delivery (VBAC) is giving birth vaginally after previously delivering a baby through a cesarean section (C-section). A VBAC may be a safe option for you, depending on your health and other factors.  Discuss VBAC with your health care provider early in your pregnancy so you can understand the risks, benefits, options, and   have plenty of time to make your birth plan.  The main risk of attempting a VBAC is that it may fail, forcing your health care provider to deliver your baby by a C-section. Other risks are rare. This information is not intended to replace advice given to you by your health care provider. Make sure  you discuss any questions you have with your health care provider. Document Revised: 03/31/2019 Document Reviewed: 03/12/2017 Elsevier Patient Education  2020 Elsevier Inc.  

## 2020-05-05 NOTE — Progress Notes (Signed)
Patient presents for ROB. Patient complains of having intermittent lower back pain.

## 2020-05-10 ENCOUNTER — Ambulatory Visit (INDEPENDENT_AMBULATORY_CARE_PROVIDER_SITE_OTHER): Payer: Medicaid Other | Admitting: Licensed Clinical Social Worker

## 2020-05-10 ENCOUNTER — Inpatient Hospital Stay (HOSPITAL_COMMUNITY)
Admission: AD | Admit: 2020-05-10 | Discharge: 2020-05-10 | Discharge status: Against Medical Advice | Payer: Medicaid Other | Attending: Family Medicine | Admitting: Family Medicine

## 2020-05-10 ENCOUNTER — Encounter (HOSPITAL_COMMUNITY): Payer: Self-pay | Admitting: Family Medicine

## 2020-05-10 ENCOUNTER — Other Ambulatory Visit: Payer: Self-pay

## 2020-05-10 DIAGNOSIS — O163 Unspecified maternal hypertension, third trimester: Secondary | ICD-10-CM | POA: Insufficient documentation

## 2020-05-10 DIAGNOSIS — Z3A34 34 weeks gestation of pregnancy: Secondary | ICD-10-CM | POA: Insufficient documentation

## 2020-05-10 DIAGNOSIS — Z79899 Other long term (current) drug therapy: Secondary | ICD-10-CM | POA: Insufficient documentation

## 2020-05-10 DIAGNOSIS — Z91199 Patient's noncompliance with other medical treatment and regimen due to unspecified reason: Secondary | ICD-10-CM

## 2020-05-10 DIAGNOSIS — O4703 False labor before 37 completed weeks of gestation, third trimester: Secondary | ICD-10-CM | POA: Diagnosis not present

## 2020-05-10 DIAGNOSIS — Z5329 Procedure and treatment not carried out because of patient's decision for other reasons: Secondary | ICD-10-CM

## 2020-05-10 DIAGNOSIS — Z87891 Personal history of nicotine dependence: Secondary | ICD-10-CM | POA: Diagnosis not present

## 2020-05-10 DIAGNOSIS — O4693 Antepartum hemorrhage, unspecified, third trimester: Secondary | ICD-10-CM | POA: Diagnosis not present

## 2020-05-10 LAB — WET PREP, GENITAL
Clue Cells Wet Prep HPF POC: NONE SEEN
Sperm: NONE SEEN
Yeast Wet Prep HPF POC: NONE SEEN

## 2020-05-10 MED ORDER — BETAMETHASONE SOD PHOS & ACET 6 (3-3) MG/ML IJ SUSP
12.0000 mg | Freq: Once | INTRAMUSCULAR | Status: AC
  Administered 2020-05-10: 12 mg via INTRAMUSCULAR
  Filled 2020-05-10: qty 5

## 2020-05-10 NOTE — MAU Note (Signed)
I informed patient that the provider wanted to get lab work because of her BP being elevated. Patient states she does not want blood work because she just had some in the office. Patient informed that she would have to leave AMA otherwise due to the nature of her situation. Patient agreed she still wanted to go home and states she will sign the form. The patient was informed that the hospital or its staff would not be responsible for any ill effects or bad outcomes. Patient reports understanding and will sign form. Provider notified.

## 2020-05-10 NOTE — BH Specialist Note (Signed)
Called pt twice. Pt no show appt

## 2020-05-10 NOTE — MAU Note (Signed)
Patient reports VB that started 3 hours ago.  Mostly dark red.  Last intercourse last night.  Denies complications w/ her pregnancy.  Had some contractions last night that subsided after having a BM.  Denies LOF.  Endorses + FM.

## 2020-05-10 NOTE — MAU Provider Note (Signed)
History     CSN: [id number]  Arrival date and time: 05/10/20 2109   First Provider Initiated Contact with Patient 05/10/20 2208      Chief Complaint  Patient presents with  . Vaginal Bleeding   [name] is a 34 y.o. [phone] at [redacted]w[redacted]d who presents today with vaginal bleeding. She reports that this started today after intercourse last night. She also reports contractions. She states that she has had contractions off and on for weeks. She was seen in MAU a few weeks ago and reports that her cervix was closed.   Vaginal Bleeding The patient's primary symptoms include pelvic pain and vaginal bleeding. This is a new problem. The current episode started today. The problem occurs intermittently. The problem has been unchanged. The pain is mild. The problem affects both sides. She is pregnant. The vaginal discharge was bloody and mucoid. The vaginal bleeding is lighter than menses. She has not been passing clots. She has not been passing tissue. The symptoms are aggravated by intercourse. She has tried nothing for the symptoms.    OB History    Gravida  4   Para  3   Term  0   Preterm  3   AB      Living  2     SAB      TAB      Ectopic      Multiple  1   Live Births  3           Past Medical History:  Diagnosis Date  . Asthma   . Obesity     Past Surgical History:  Procedure Laterality Date  . BREAST BIOPSY Left 07/31/2013   Procedure: IRRIGATION AND DEBRIDMENT LEFT BREAST ABSCESS;  Surgeon: Lodema Pilot, DO;  Location: [facility];  Service: General;  Laterality: Left;  IRRIGATION AND DEBRIDEMENT LEFT BREAST ABSCESS  . CESAREAN SECTION      Family History  Problem Relation Age of Onset  . Diabetes Father     Social History   Tobacco Use  . Smoking status: Former Smoker    Packs/day: 1.00    Types: Cigarettes  . Smokeless tobacco: Never Used  . Tobacco comment: rare   Substance Use Topics  . Alcohol use: No  . Drug use: Never    Allergies: No  Known Allergies  Medications Prior to Admission  Medication Sig Dispense Refill Last Dose  . MELATONIN PO Take by mouth.   05/09/2020 at Unknown time  . Prenat-Fe Poly-Methfol-FA-DHA (VITAFOL ULTRA) 29-0.6-0.4-200 MG CAPS Take 1 capsule by mouth daily before breakfast. 90 capsule 4 05/10/2020 at Unknown time  . albuterol (PROVENTIL HFA;VENTOLIN HFA) 108 (90 Base) MCG/ACT inhaler Inhale 1-2 puffs into the lungs every 6 (six) hours as needed for wheezing or shortness of breath. 1 Inhaler 0   . aspirin EC 81 MG tablet Take 1 tablet (81 mg total) by mouth daily. Take after 12 weeks for prevention of preeclampsia later in pregnancy (Patient not taking: Reported on 05/05/2020) 300 tablet 2   . cetirizine (ZYRTEC ALLERGY) 10 MG tablet Take 1 tablet (10 mg total) by mouth daily. (Patient not taking: Reported on 05/05/2020) 30 tablet 0   . docusate sodium (COLACE) 100 MG capsule Take 1 capsule (100 mg total) by mouth 2 (two) times daily. (Patient not taking: Reported on 04/26/2020) 60 capsule 5   . triamcinolone cream (KENALOG) 0.1 % Apply 1 application topically 2 (two) times daily. (Patient not taking: Reported on 05/05/2020)  30 g 0     Review of Systems  Genitourinary: Positive for pelvic pain and vaginal bleeding.   Physical Exam   Blood pressure 135/83, pulse (!) 110, temperature 98.2 F (36.8 C), resp. rate 19, weight 102.1 kg, SpO2 100 %, unknown if currently breastfeeding.  Physical Exam  Nursing note and vitals reviewed. Constitutional: She is oriented to person, place, and time. She appears well-developed and well-nourished. No distress.  HENT:  Head: Normocephalic.  Cardiovascular: Normal rate.  Respiratory: Effort normal.  GI: Soft. There is no abdominal tenderness. There is no rebound.  Genitourinary:    Genitourinary Comments:  Dilation: 3 Effacement (%): 80 Station: -2 Exam by:: Marcille Buffy, CNM    Neurological: She is alert and oriented to person, place, and time.  Skin: Skin  is warm and dry.  Psychiatric: She has a normal mood and affect.     NST:  Baseline: 150 Variability: moderate Accels: 15x15 Decels: none Toco: q4-5 mins Reactive/Appropriate for GA  MAU Course  Procedures  MDM  DW patient that it is possible that she in early labor if her cervix was closed and she is now 3cm. Although I don't see a note about this exam in Epic. I discussed that I would like to try IV fluids and then recheck her in about an hour. She states that she would like to go home at this time. Offered for her to get BMZ today and FU in 24 hours v staying and rechecking in an hour. She is agreeable with this plan.   As RN was getting DC vitals patient was noted to be hypertensive. She denies any HA or visual disturbances at this time. DW patient that I would like to get lab work to make sure that her blood pressure isn't a blood pressure complication of pregnancy. Patient does not wish to have blood work done today. She would prefer to sign out AMA at this time.   Assessment and Plan   1. Hypertension during pregnancy in third trimester, unspecified hypertension in pregnancy type   2. [redacted] weeks gestation of pregnancy   3. Threatened premature labor in third trimester    Patient left AMA Advised her to return tomorrow evening for repeat BMZ injection Pre-eclampsia warning signs reviewed    [place], CNM  05/10/20  10:39 PM

## 2020-05-11 ENCOUNTER — Inpatient Hospital Stay (HOSPITAL_COMMUNITY)
Admission: AD | Admit: 2020-05-11 | Discharge: 2020-05-11 | Discharge status: Against Medical Advice | Payer: Medicaid Other | Attending: Obstetrics and Gynecology | Admitting: Obstetrics and Gynecology

## 2020-05-11 ENCOUNTER — Encounter (HOSPITAL_COMMUNITY): Payer: Self-pay | Admitting: Obstetrics and Gynecology

## 2020-05-11 ENCOUNTER — Inpatient Hospital Stay (EMERGENCY_DEPARTMENT_HOSPITAL)
Admission: AD | Admit: 2020-05-11 | Discharge: 2020-05-12 | Discharge status: Against Medical Advice | Payer: Medicaid Other | Source: Home / Self Care | Attending: Obstetrics & Gynecology | Admitting: Obstetrics & Gynecology

## 2020-05-11 DIAGNOSIS — O368390 Maternal care for abnormalities of the fetal heart rate or rhythm, unspecified trimester, not applicable or unspecified: Secondary | ICD-10-CM

## 2020-05-11 DIAGNOSIS — Z5329 Procedure and treatment not carried out because of patient's decision for other reasons: Secondary | ICD-10-CM | POA: Insufficient documentation

## 2020-05-11 DIAGNOSIS — O288 Other abnormal findings on antenatal screening of mother: Secondary | ICD-10-CM

## 2020-05-11 DIAGNOSIS — Z87891 Personal history of nicotine dependence: Secondary | ICD-10-CM | POA: Diagnosis not present

## 2020-05-11 DIAGNOSIS — O10919 Unspecified pre-existing hypertension complicating pregnancy, unspecified trimester: Secondary | ICD-10-CM

## 2020-05-11 DIAGNOSIS — Z3A34 34 weeks gestation of pregnancy: Secondary | ICD-10-CM

## 2020-05-11 DIAGNOSIS — J45909 Unspecified asthma, uncomplicated: Secondary | ICD-10-CM | POA: Insufficient documentation

## 2020-05-11 DIAGNOSIS — Z79899 Other long term (current) drug therapy: Secondary | ICD-10-CM | POA: Insufficient documentation

## 2020-05-11 DIAGNOSIS — O99513 Diseases of the respiratory system complicating pregnancy, third trimester: Secondary | ICD-10-CM | POA: Diagnosis not present

## 2020-05-11 DIAGNOSIS — E669 Obesity, unspecified: Secondary | ICD-10-CM | POA: Insufficient documentation

## 2020-05-11 DIAGNOSIS — O133 Gestational [pregnancy-induced] hypertension without significant proteinuria, third trimester: Secondary | ICD-10-CM

## 2020-05-11 DIAGNOSIS — O09213 Supervision of pregnancy with history of pre-term labor, third trimester: Secondary | ICD-10-CM | POA: Insufficient documentation

## 2020-05-11 DIAGNOSIS — R12 Heartburn: Secondary | ICD-10-CM

## 2020-05-11 DIAGNOSIS — O99213 Obesity complicating pregnancy, third trimester: Secondary | ICD-10-CM | POA: Diagnosis not present

## 2020-05-11 DIAGNOSIS — O99891 Other specified diseases and conditions complicating pregnancy: Secondary | ICD-10-CM | POA: Diagnosis not present

## 2020-05-11 DIAGNOSIS — O26893 Other specified pregnancy related conditions, third trimester: Secondary | ICD-10-CM | POA: Diagnosis not present

## 2020-05-11 DIAGNOSIS — Z7982 Long term (current) use of aspirin: Secondary | ICD-10-CM | POA: Insufficient documentation

## 2020-05-11 DIAGNOSIS — R030 Elevated blood-pressure reading, without diagnosis of hypertension: Secondary | ICD-10-CM | POA: Diagnosis not present

## 2020-05-11 LAB — GC/CHLAMYDIA PROBE AMP (~~LOC~~) NOT AT ARMC
Chlamydia: NEGATIVE
Comment: NEGATIVE
Comment: NORMAL
Neisseria Gonorrhea: NEGATIVE

## 2020-05-11 MED ORDER — METRONIDAZOLE 500 MG PO TABS
2000.0000 mg | ORAL_TABLET | Freq: Once | ORAL | Status: AC
  Administered 2020-05-11: 2000 mg via ORAL
  Filled 2020-05-11: qty 4

## 2020-05-11 MED ORDER — BETAMETHASONE SOD PHOS & ACET 6 (3-3) MG/ML IJ SUSP: 12.0000 mg | Freq: Once | INTRAMUSCULAR | Status: DC

## 2020-05-11 NOTE — MAU Note (Signed)
Pt declining blood work

## 2020-05-11 NOTE — MAU Provider Note (Signed)
CC:  Chief Complaint  Patient presents with  . BMZ     First Provider Initiated Contact with Patient 05/11/20 1329      HPI: [name] is a 34 y.o. year old [id number] female at [redacted]w[redacted]d weeks gestation who presents to MAU for second betamethasone shot.  Was seen in MAU last night for preterm contractions.  Her cervix was 3 cm dilated (with no baseline cervical exam for comparison) and patient has history of preterm birth x2 and was given betamethasone #1.  States she was told to come back today at noon for second shot.  Denies contractions.  Patient also noted to have elevated blood pressure last night and and MAU today.  Denies headache, vision changes or epigastric pain.  Refused labs last night and refuses labs against today.  Associated Sx:  Vaginal bleeding: Denies Leaking of fluid: Denies Fetal movement: Active  O:  Patient Vitals for the past 24 hrs:  BP Temp Pulse Resp SpO2 Height  05/11/20 1345 (!) 157/101 -- (!) 115 -- -- --  05/11/20 1330 135/84 -- (!) 104 -- -- --  05/11/20 1316 140/82 -- (!) 118 -- -- --  05/11/20 1258 (!) 144/90 98 F (36.7 C) (!) 119 18 -- 5\' 3"  (1.6 m)  05/11/20 1257 -- -- -- -- 100 % --    General: NAD Heart: Regular rate Lungs: Normal rate and effort Abd: Soft, NT, Gravid, S=D Pelvic: NEFG, no blood.  Dilation: 4 Effacement (%): 70 Cervical Position: Middle Station: Ballotable Presentation: Vertex Exam by:: [email or web address], CNM  EFM: 145, Moderate variability, 10 X 10 accelerations, no decelerations Toco: Uterine irritability  MAU course Orders Placed This Encounter  Procedures  . Urinalysis, Routine w reflex microscopic  . Protein / creatinine ratio, urine  . Vital signs   Meds ordered this encounter  Medications  . DISCONTD: betamethasone acetate-betamethasone sodium phosphate (CELESTONE) injection 12 mg  . metroNIDAZOLE (FLAGYL) tablet 2,000 mg     MDM -Possible preterm labor.  Cervix change from 3 last night to 4 cm  today.  Patient refused to stay for further evaluation and cervical exams.  States her contractions went away.  RN had explained that we normally give second betamethasone shot 24 hours after first.  CNM verify this with patient, but also offered to give it now even though it would be early since patient plans to leave AMA but patient refuses it now.  States she will come back tonight when she has her bags packed and "her ducks in a row" and will get it then.  Preterm labor precautions reviewed.  Discussed -DX trichomonas.  Flagyl given in MAU.  Expedited partner therapy given to her partner, [name] who was in the room.  He verified that he did not have any medication allergies.  Instructed them to avoid intercourse for 1 week after treatment and recommend always using condoms. -Elevated blood pressure.  Patient again refuses preeclampsia labs.  No preeclampsia symptoms.  Reviewed risks of preeclampsia and ED eclampsia inform patient that blood work is necessary to determine her diagnosis and risk for seizures.  Patient asked CNM to review labs from new OB on Apr 21, 2020.  Explained that lab work can change quickly as preeclampsia evolves and labs will need to be repeated today. Continues to refuse blood work and further monitoring.  States she will consent to labs when she comes back tonight.  Preeclampsia precautions. -Nonreactive NST.  Fetal heart rate with moderate variability and Tenex  10 accelerations.  Audible fetal movement heard and palpated.  Encourage patient to stay on monitor, p.o. hydrate and let us monitor her baby further to verify fetal wellbeing.  Patient states her baby is moving well and that it is fine and refuses further monitoring.  A: [redacted]w[redacted]d week IUP Preterm labor Gestational hypertension versus preeclampsia Nonreactive NST Minus infection in pregnancy FHR reactive Maternal tachycardia of unknown etiology   P:  Left AMA after counseling of the risks of leaving without  complete evaluation.  Verbalizes understanding and states she will return tonight for second betamethasone shot, labs and possible admission.  Tamala Julian, [place], [place] 05/11/2020 2:23 PM  3

## 2020-05-11 NOTE — MAU Note (Signed)
Pt requesting to leave AMA, declining any further evaluation today. Treatment for flagyl given to patient. Pt states she will return tonight for BMZ injection. AMA form signed by pt and witnessed by this RN.

## 2020-05-11 NOTE — MAU Note (Signed)
.   [name] is a 34 y.o. at [redacted]w[redacted]d here in MAU reporting: that her physician wanted to admit her last night because she was 3cms. Denies pain at present. Was told to come in and repeat BMZ  Onset of complaint: ongoing Pain score: 0 Vitals:   05/11/20 1257 05/11/20 1258  BP:  (!) 144/90  Pulse:  (!) 119  Resp:  18  Temp:  98 F (36.7 C)  SpO2: 100%      FHT:145 Lab orders placed from triage: UA

## 2020-05-11 NOTE — MAU Note (Signed)
Pt here for 2nd BMZ injection. Denies any pain, VB, or LOF. States she will stay tonight for FM if needed.

## 2020-05-12 ENCOUNTER — Inpatient Hospital Stay (HOSPITAL_BASED_OUTPATIENT_CLINIC_OR_DEPARTMENT_OTHER): Payer: Medicaid Other

## 2020-05-12 DIAGNOSIS — O10919 Unspecified pre-existing hypertension complicating pregnancy, unspecified trimester: Secondary | ICD-10-CM

## 2020-05-12 DIAGNOSIS — O133 Gestational [pregnancy-induced] hypertension without significant proteinuria, third trimester: Secondary | ICD-10-CM

## 2020-05-12 DIAGNOSIS — O288 Other abnormal findings on antenatal screening of mother: Secondary | ICD-10-CM | POA: Diagnosis not present

## 2020-05-12 DIAGNOSIS — R12 Heartburn: Secondary | ICD-10-CM

## 2020-05-12 DIAGNOSIS — Z3A34 34 weeks gestation of pregnancy: Secondary | ICD-10-CM | POA: Diagnosis not present

## 2020-05-12 DIAGNOSIS — O99891 Other specified diseases and conditions complicating pregnancy: Secondary | ICD-10-CM

## 2020-05-12 HISTORY — DX: Unspecified pre-existing hypertension complicating pregnancy, unspecified trimester: O10.919

## 2020-05-12 MED ORDER — BETAMETHASONE SOD PHOS & ACET 6 (3-3) MG/ML IJ SUSP
12.0000 mg | Freq: Once | INTRAMUSCULAR | Status: AC
  Administered 2020-05-12: 12 mg via INTRAMUSCULAR
  Filled 2020-05-12: qty 5

## 2020-05-12 MED ORDER — CALCIUM CARBONATE ANTACID 500 MG PO CHEW
2.0000 | CHEWABLE_TABLET | Freq: Once | ORAL | Status: AC
  Administered 2020-05-12: 400 mg via ORAL
  Filled 2020-05-12: qty 2

## 2020-05-12 NOTE — MAU Provider Note (Signed)
Chief Complaint:  Injections   First Provider Initiated Contact with Patient 05/12/20 0034     HPI: [name] is a 34 y.o. [id number] at [redacted]w[redacted]d who presents to maternity admissions for second betamethasone injection.  Was seen in MAU last night and this morning for contractions, was 4 cm dilated.  Reports no further contractions or abdominal pain.  Previously in MAU had a nonreactive fetal tracing and new onset elevated blood pressures.  She refused further fetal monitoring and refused blood work, she signed out [employer].  She denies any history of high blood pressure, denies headache, visual disturbance, or epigastric pain.  She does endorse heartburn that has been worsening throughout her pregnancy.  Has not been treating her heartburn.  Reports good fetal movement.   Pregnancy Course: [facility]. Hx of PTD x 3. Limited prenatal care - has missed recent ob appointments  Past Medical History:  Diagnosis Date  . Asthma   . Obesity    OB History  Gravida Para Term Preterm AB Living  4 3 0 3   2  SAB TAB Ectopic Multiple Live Births        0 3    # Outcome Date GA Lbr Len/2nd Weight Sex Delivery Anes PTL Lv  4 Current           3 Preterm 03/29/11    M Vag-Spont   LIV  2 Preterm 02/27/09    F Vag-Spont   ND  1 Preterm 07/24/04 [redacted]w[redacted]d  907 g M CS-Unspec   LIV   Past Surgical History:  Procedure Laterality Date  . BREAST BIOPSY Left 07/31/2013   Procedure: IRRIGATION AND DEBRIDMENT LEFT BREAST ABSCESS;  Surgeon: Madilyn Hook, DO;  Location: [facility];  Service: General;  Laterality: Left;  IRRIGATION AND DEBRIDEMENT LEFT BREAST ABSCESS  . CESAREAN SECTION     Family History  Problem Relation Age of Onset  . Diabetes Father    Social History   Tobacco Use  . Smoking status: Former Smoker    Packs/day: 1.00    Types: Cigarettes  . Smokeless tobacco: Never Used  . Tobacco comment: rare   Substance Use Topics  . Alcohol use: No  . Drug use: Never   No Known Allergies Medications Prior  to Admission  Medication Sig Dispense Refill Last Dose  . albuterol (PROVENTIL HFA;VENTOLIN HFA) 108 (90 Base) MCG/ACT inhaler Inhale 1-2 puffs into the lungs every 6 (six) hours as needed for wheezing or shortness of breath. 1 Inhaler 0 05/12/2020 at Unknown time  . MELATONIN PO Take by mouth.   05/12/2020 at Unknown time  . Prenat-Fe Poly-Methfol-FA-DHA (VITAFOL ULTRA) 29-0.6-0.4-200 MG CAPS Take 1 capsule by mouth daily before breakfast. 90 capsule 4 05/12/2020 at Unknown time  . aspirin EC 81 MG tablet Take 1 tablet (81 mg total) by mouth daily. Take after 12 weeks for prevention of preeclampsia later in pregnancy (Patient not taking: Reported on 05/05/2020) 300 tablet 2 Unknown at Unknown time  . cetirizine (ZYRTEC ALLERGY) 10 MG tablet Take 1 tablet (10 mg total) by mouth daily. (Patient not taking: Reported on 05/05/2020) 30 tablet 0 Unknown at Unknown time  . docusate sodium (COLACE) 100 MG capsule Take 1 capsule (100 mg total) by mouth 2 (two) times daily. (Patient not taking: Reported on 04/26/2020) 60 capsule 5 Unknown at Unknown time  . triamcinolone cream (KENALOG) 0.1 % Apply 1 application topically 2 (two) times daily. (Patient not taking: Reported on 05/05/2020) 30 g 0 Unknown at  Unknown time    I have reviewed patient's Past Medical Hx, Surgical Hx, Family Hx, Social Hx, medications and allergies.   ROS:  Review of Systems  Constitutional: Negative.   Eyes: Negative for visual disturbance.  Gastrointestinal: Negative for abdominal pain, diarrhea, rectal pain and vomiting.       +heartburn  Genitourinary: Negative.   Neurological: Negative for headaches.    Physical Exam   Patient Vitals for the past 24 hrs:  BP Temp Pulse Resp SpO2 Height Weight  05/12/20 0111 140/77 - (!) 106 - - - -  05/12/20 0110 140/77 - (!) 105 19 - - -  05/12/20 0030 136/76 - (!) 106 - 99 % - -  05/12/20 0019 131/76 - (!) 108 - - - -  05/11/20 2358 - - - - 99 % - -  05/11/20 2357 134/90 - (!) 115 - - -  -  05/11/20 2354 - 98.3 F (36.8 C) - 18 - 5\' 3"  (1.6 m) 102.5 kg    Constitutional: Well-developed, well-nourished female in no acute distress.  Cardiovascular: normal rate & rhythm, no murmur Respiratory: normal effort, lung sounds clear throughout GI: Abd soft, non-tender, gravid appropriate for gestational age. Pos BS x 4 MS: Extremities nontender, no edema, normal ROM Neurologic: Alert and oriented x 4.   NST:  Baseline: 150 bpm, Variability: Good {> 6 bpm), Accelerations: none and Decelerations: Absent   Labs: No results found for this or any previous visit (from the past 24 hour(s)).  Imaging:  No results found.  MAU Course: Orders Placed This Encounter  Procedures  . MFM FETAL BPP WO NON STRESS  . Urinalysis, Routine w reflex microscopic  . Protein / creatinine ratio, urine   Meds ordered this encounter  Medications  . calcium carbonate (TUMS - dosed in mg elemental calcium) chewable tablet 400 mg of elemental calcium  . betamethasone acetate-betamethasone sodium phosphate (CELESTONE) injection 12 mg    MDM: Fetal tracing reassuring, but not reactive.  BPP ordered and is 8/8.  Tums given for heartburn - patient reports good relief.   Elevated blood pressures in MAU this evening, as well as earlier today.  Patient continues to refuse preeclampsia labs.  No severe features at this time.  Discussed with patient that she now has diagnosis of gestational hypertension but unable to rule out preeclampsia without proper labs and urine sample.  Maintains that she does not need her labs drawn, and is unable to give a urine sample.  States that she had an OB appointment on June 3, will keep that appointment and get labs if still needed.  Discussed implications of undiagnosed and untreated preeclampsia including placental abruption, fetal death, seizures, or maternal death. 02-05-2001 RN at bedside during this discussion.   Assessment: 1. Gestational hypertension, third  trimester   2. Non-reactive NST (non-stress test)   3. [redacted] weeks gestation of pregnancy   4. Left against medical advice   5. Heartburn during pregnancy in third trimester     Plan: Patient signed out against medical advice  Ralene Bathe, NP 05/12/2020 1:32 AM

## 2020-05-12 NOTE — MAU Note (Signed)
Pt signed AMA due to refusal of further EFM and blood work.  Pt states "I know my body.  This is the furthest that I have ever carried my babies so I know that I am going to be good."  "I have an appointment on Tuesday, my doctor can do the labs their."  Pt given information on pre-eclampsia.  Pt stated thank you.

## 2020-05-14 ENCOUNTER — Inpatient Hospital Stay (HOSPITAL_COMMUNITY): Payer: Medicaid Other

## 2020-05-14 ENCOUNTER — Other Ambulatory Visit: Payer: Self-pay

## 2020-05-14 ENCOUNTER — Inpatient Hospital Stay (HOSPITAL_COMMUNITY)
Admission: AD | Admit: 2020-05-14 | Discharge: 2020-05-14 | Disposition: A | Discharge status: Home / Self Care | Payer: Medicaid Other | Source: Ambulatory Visit | Attending: Obstetrics and Gynecology | Admitting: Obstetrics and Gynecology

## 2020-05-14 ENCOUNTER — Encounter (HOSPITAL_COMMUNITY): Payer: Self-pay | Admitting: Obstetrics and Gynecology

## 2020-05-14 DIAGNOSIS — Z87891 Personal history of nicotine dependence: Secondary | ICD-10-CM | POA: Insufficient documentation

## 2020-05-14 DIAGNOSIS — R12 Heartburn: Secondary | ICD-10-CM | POA: Diagnosis not present

## 2020-05-14 DIAGNOSIS — A5901 Trichomonal vulvovaginitis: Secondary | ICD-10-CM

## 2020-05-14 DIAGNOSIS — J45909 Unspecified asthma, uncomplicated: Secondary | ICD-10-CM | POA: Insufficient documentation

## 2020-05-14 DIAGNOSIS — Z79899 Other long term (current) drug therapy: Secondary | ICD-10-CM | POA: Insufficient documentation

## 2020-05-14 DIAGNOSIS — R109 Unspecified abdominal pain: Secondary | ICD-10-CM | POA: Diagnosis present

## 2020-05-14 DIAGNOSIS — R1011 Right upper quadrant pain: Secondary | ICD-10-CM

## 2020-05-14 DIAGNOSIS — O10919 Unspecified pre-existing hypertension complicating pregnancy, unspecified trimester: Secondary | ICD-10-CM

## 2020-05-14 DIAGNOSIS — O26893 Other specified pregnancy related conditions, third trimester: Secondary | ICD-10-CM

## 2020-05-14 DIAGNOSIS — O99513 Diseases of the respiratory system complicating pregnancy, third trimester: Secondary | ICD-10-CM | POA: Diagnosis not present

## 2020-05-14 DIAGNOSIS — O133 Gestational [pregnancy-induced] hypertension without significant proteinuria, third trimester: Secondary | ICD-10-CM

## 2020-05-14 DIAGNOSIS — Z3689 Encounter for other specified antenatal screening: Secondary | ICD-10-CM

## 2020-05-14 DIAGNOSIS — Z3A34 34 weeks gestation of pregnancy: Secondary | ICD-10-CM

## 2020-05-14 DIAGNOSIS — R7989 Other specified abnormal findings of blood chemistry: Secondary | ICD-10-CM

## 2020-05-14 DIAGNOSIS — O09899 Supervision of other high risk pregnancies, unspecified trimester: Secondary | ICD-10-CM

## 2020-05-14 DIAGNOSIS — R7401 Elevation of levels of liver transaminase levels: Secondary | ICD-10-CM | POA: Insufficient documentation

## 2020-05-14 DIAGNOSIS — Z833 Family history of diabetes mellitus: Secondary | ICD-10-CM | POA: Diagnosis not present

## 2020-05-14 DIAGNOSIS — O0932 Supervision of pregnancy with insufficient antenatal care, second trimester: Secondary | ICD-10-CM

## 2020-05-14 DIAGNOSIS — K828 Other specified diseases of gallbladder: Secondary | ICD-10-CM

## 2020-05-14 LAB — CBC
HCT: 33.9 % — ABNORMAL LOW (ref 36.0–46.0)
Hemoglobin: 11.2 g/dL — ABNORMAL LOW (ref 12.0–15.0)
MCH: 32.6 pg (ref 26.0–34.0)
MCHC: 33 g/dL (ref 30.0–36.0)
MCV: 98.5 fL (ref 80.0–100.0)
Platelets: 306 10*3/uL (ref 150–400)
RBC: 3.44 MIL/uL — ABNORMAL LOW (ref 3.87–5.11)
RDW: 14 % (ref 11.5–15.5)
WBC: 18.8 10*3/uL — ABNORMAL HIGH (ref 4.0–10.5)
nRBC: 0.3 % — ABNORMAL HIGH (ref 0.0–0.2)

## 2020-05-14 LAB — COMPREHENSIVE METABOLIC PANEL
ALT: 74 U/L — ABNORMAL HIGH (ref 0–44)
AST: 72 U/L — ABNORMAL HIGH (ref 15–41)
Albumin: 2.7 g/dL — ABNORMAL LOW (ref 3.5–5.0)
Alkaline Phosphatase: 134 U/L — ABNORMAL HIGH (ref 38–126)
Anion gap: 13 (ref 5–15)
BUN: <5 mg/dL — ABNORMAL LOW (ref 6–20)
CO2: 16 mmol/L — ABNORMAL LOW (ref 22–32)
Calcium: 9.1 mg/dL (ref 8.9–10.3)
Chloride: 105 mmol/L (ref 98–111)
Creatinine, Ser: 0.59 mg/dL (ref 0.44–1.00)
GFR calc Af Amer: >60 mL/min (ref >60)
GFR calc non Af Amer: >60 mL/min (ref >60)
Glucose, Bld: 160 mg/dL — ABNORMAL HIGH (ref 70–99)
Potassium: 3.4 mmol/L — ABNORMAL LOW (ref 3.5–5.1)
Sodium: 134 mmol/L — ABNORMAL LOW (ref 135–145)
Total Bilirubin: 0.4 mg/dL (ref 0.3–1.2)
Total Protein: 6.5 g/dL (ref 6.5–8.1)

## 2020-05-14 LAB — URINALYSIS, ROUTINE W REFLEX MICROSCOPIC
Bilirubin Urine: NEGATIVE
Glucose, UA: NEGATIVE mg/dL
Hgb urine dipstick: NEGATIVE
Ketones, ur: NEGATIVE mg/dL
Leukocytes,Ua: NEGATIVE
Nitrite: NEGATIVE
Protein, ur: NEGATIVE mg/dL
Specific Gravity, Urine: 1.001 — ABNORMAL LOW (ref 1.005–1.030)
pH: 7 (ref 5.0–8.0)

## 2020-05-14 LAB — PROTEIN / CREATININE RATIO, URINE
Creatinine, Urine: 19.57 mg/dL
Total Protein, Urine: <6 mg/dL

## 2020-05-14 MED ORDER — ALBUTEROL SULFATE HFA 108 (90 BASE) MCG/ACT IN AERS: 1.0000 | INHALATION_SPRAY | Freq: Four times a day (QID) | RESPIRATORY_TRACT | 0 refills | Status: DC | PRN

## 2020-05-14 MED ORDER — ALUM & MAG HYDROXIDE-SIMETH 200-200-20 MG/5ML PO SUSP
30.0000 mL | Freq: Once | ORAL | Status: AC
  Administered 2020-05-14: 30 mL via ORAL
  Filled 2020-05-14: qty 30

## 2020-05-14 MED ORDER — PANTOPRAZOLE SODIUM 40 MG IV SOLR: 40.0000 mg | Freq: Once | INTRAVENOUS | Status: DC

## 2020-05-14 MED ORDER — LACTATED RINGERS IV BOLUS: 1000.0000 mL | Freq: Once | INTRAVENOUS | Status: DC

## 2020-05-14 MED ORDER — PANTOPRAZOLE SODIUM 40 MG PO TBEC
40.0000 mg | DELAYED_RELEASE_TABLET | Freq: Once | ORAL | Status: AC
  Administered 2020-05-14: 40 mg via ORAL
  Filled 2020-05-14: qty 1

## 2020-05-14 MED ORDER — PANTOPRAZOLE SODIUM 20 MG PO TBEC
20.0000 mg | DELAYED_RELEASE_TABLET | Freq: Two times a day (BID) | ORAL | 0 refills | Status: DC
Start: 2020-05-14 — End: 2021-03-20

## 2020-05-14 NOTE — Discharge Instructions (Signed)
Gallbladder Eating Plan If you have a gallbladder condition, you may have trouble digesting fats. Eating a low-fat diet can help reduce your symptoms, and may be helpful before and after having surgery to remove your gallbladder (cholecystectomy). Your health care provider may recommend that you work with a diet and nutrition specialist (dietitian) to help you reduce the amount of fat in your diet. What are tips for following this plan? General guidelines  Limit your fat intake to less than 30% of your total daily calories. If you eat around 1,800 calories each day, this is less than 60 grams (g) of fat per day.  Fat is an important part of a healthy diet. Eating a low-fat diet can make it hard to maintain a healthy body weight. Ask your dietitian how much fat, calories, and other nutrients you need each day.  Eat small, frequent meals throughout the day instead of three large meals.  Drink at least 8-10 cups of fluid a day. Drink enough fluid to keep your urine clear or pale yellow.  Limit alcohol intake to no more than 1 drink a day for nonpregnant women and 2 drinks a day for men. One drink equals 12 oz of beer, 5 oz of wine, or 1 oz of hard liquor. Reading food labels  Check Nutrition Facts on food labels for the amount of fat per serving. Choose foods with less than 3 grams of fat per serving. Shopping  Choose nonfat and low-fat healthy foods. Look for the words "nonfat," "low fat," or "fat free."  Avoid buying processed or prepackaged foods. Cooking  Cook using low-fat methods, such as baking, broiling, grilling, or boiling.  Cook with small amounts of healthy fats, such as olive oil, grapeseed oil, canola oil, or sunflower oil. What foods are recommended?   All fresh, frozen, or canned fruits and vegetables.  Whole grains.  Low-fat or non-fat (skim) milk and yogurt.  Lean meat, skinless poultry, fish, eggs, and beans.  Low-fat protein supplement powders or  drinks.  Spices and herbs. What foods are not recommended?  High-fat foods. These include baked goods, fast food, fatty cuts of meat, ice cream, french toast, sweet rolls, pizza, cheese bread, foods covered with butter, creamy sauces, or cheese.  Fried foods. These include french fries, tempura, battered fish, breaded chicken, fried breads, and sweets.  Foods with strong odors.  Foods that cause bloating and gas. Summary  A low-fat diet can be helpful if you have a gallbladder condition, or before and after gallbladder surgery.  Limit your fat intake to less than 30% of your total daily calories. This is about 60 g of fat if you eat 1,800 calories each day.  Eat small, frequent meals throughout the day instead of three large meals. This information is not intended to replace advice given to you by your health care provider. Make sure you discuss any questions you have with your health care provider. Document Revised: 03/26/2019 Document Reviewed: 01/10/2017 Elsevier Patient Education  2020 Elsevier Inc.  

## 2020-05-14 NOTE — MAU Provider Note (Signed)
History     CSN: [id number]  Arrival date and time: 05/14/20 0234   First Provider Initiated Contact with Patient 05/14/20 0310      Chief Complaint  Patient presents with  . Abdominal Pain  . Heartburn   [name] is a 34 y.o. [id number] at 44w5dwho receives care at [facility].  She presents today for Heartburn and Abdominal Pain.  She states that she has been experiencing heartburn throughout the pregnancy and that it has been mildly relieved with "tums and a teaspoon of mustard."  Patient states she has been experiencing some right sided upper quadrant pain that started 3 hours ago and is a 5/10.  Patient describes the pain as sharp and constant. Patient states the pain is worsened by the heartburn. Patient reports that she was given precautions regarding this type of discomfort when she was last seen and is concerned. Patient acknowledges that she was difficult with staff/provider at her last visit.  However, patient states that "I need you guys."  Patient endorses fetal movement and occasional contractions.  Patient reports that she was 5cm with her most recent vaginal exam. Patient denies HA or visual disturbances.    4pm [employer] Mac N Cheese, Collard greens, and cauliflower. Pizza; Pepperoni and Salad     OB History    Gravida  4   Para  3   Term  0   Preterm  3   AB      Living  2     SAB      TAB      Ectopic      Multiple  0   Live Births  3           Past Medical History:  Diagnosis Date  . Asthma   . Obesity     Past Surgical History:  Procedure Laterality Date  . BREAST BIOPSY Left 07/31/2013   Procedure: IRRIGATION AND DEBRIDMENT LEFT BREAST ABSCESS;  Surgeon: BMadilyn Hook DO;  Location: [facility];  Service: General;  Laterality: Left;  IRRIGATION AND DEBRIDEMENT LEFT BREAST ABSCESS  . CESAREAN SECTION      Family History  Problem Relation Age of Onset  . Diabetes Father     Social History   Tobacco Use  . Smoking  status: Former Smoker    Packs/day: 1.00    Types: Cigarettes  . Smokeless tobacco: Never Used  . Tobacco comment: rare   Substance Use Topics  . Alcohol use: No  . Drug use: Never    Allergies: No Known Allergies  Medications Prior to Admission  Medication Sig Dispense Refill Last Dose  . Prenat-Fe Poly-Methfol-FA-DHA (VITAFOL ULTRA) 29-0.6-0.4-200 MG CAPS Take 1 capsule by mouth daily before breakfast. 90 capsule 4 05/13/2020 at Unknown time  . albuterol (PROVENTIL HFA;VENTOLIN HFA) 108 (90 Base) MCG/ACT inhaler Inhale 1-2 puffs into the lungs every 6 (six) hours as needed for wheezing or shortness of breath. 1 Inhaler 0   . aspirin EC 81 MG tablet Take 1 tablet (81 mg total) by mouth daily. Take after 12 weeks for prevention of preeclampsia later in pregnancy (Patient not taking: Reported on 05/05/2020) 300 tablet 2   . cetirizine (ZYRTEC ALLERGY) 10 MG tablet Take 1 tablet (10 mg total) by mouth daily. (Patient not taking: Reported on 05/05/2020) 30 tablet 0   . docusate sodium (COLACE) 100 MG capsule Take 1 capsule (100 mg total) by mouth 2 (two) times daily. (Patient not taking: Reported on 04/26/2020)  60 capsule 5   . MELATONIN PO Take by mouth.     . triamcinolone cream (KENALOG) 0.1 % Apply 1 application topically 2 (two) times daily. (Patient not taking: Reported on 05/05/2020) 30 g 0     Review of Systems  Constitutional: Negative for chills and fever.  Eyes: Negative for visual disturbance.  Respiratory: Positive for shortness of breath (Asthma). Negative for cough.   Gastrointestinal: Positive for abdominal pain. Negative for constipation, diarrhea, nausea and vomiting.  Genitourinary: Positive for flank pain (Right) and pelvic pain. Negative for difficulty urinating, dysuria, vaginal bleeding and vaginal discharge.  Musculoskeletal: Positive for back pain.  Neurological: Negative for dizziness, light-headedness and headaches.   Physical Exam   Blood pressure (!) 143/85,  pulse 95, temperature 98.5 F (36.9 C), temperature source Oral, resp. rate 18, SpO2 98 %, unknown if currently breastfeeding.  Physical Exam  Constitutional: She is oriented to person, place, and time. She appears well-developed and well-nourished.  HENT:  Head: Normocephalic and atraumatic.  Eyes: Conjunctivae are normal.  Cardiovascular: Normal rate, regular rhythm and normal heart sounds.  Respiratory: Effort normal and breath sounds normal. No respiratory distress.  GI: Soft. There is abdominal tenderness in the right upper quadrant. There is no rigidity and no guarding.  Gravid--fundal height appears LGA, Soft   Musculoskeletal:        General: Normal range of motion.     Cervical back: Normal range of motion.  Neurological: She is alert and oriented to person, place, and time.  Skin: Skin is warm and dry.  Psychiatric: She has a normal mood and affect. Her behavior is normal.    Fetal Assessment 145 bpm, Mod Var, -Decels, +Accels Toco: 2 ctx graphed  MAU Course   Results for orders placed or performed during the hospital encounter of 05/14/20 (from the past 24 hour(s))  Urinalysis, Routine w reflex microscopic     Status: Abnormal   Collection Time: 05/14/20  3:02 AM  Result Value Ref Range   Color, Urine STRAW (A) YELLOW   APPearance CLEAR CLEAR   Specific Gravity, Urine 1.001 (L) 1.005 - 1.030   pH 7.0 5.0 - 8.0   Glucose, UA NEGATIVE NEGATIVE mg/dL   Hgb urine dipstick NEGATIVE NEGATIVE   Bilirubin Urine NEGATIVE NEGATIVE   Ketones, ur NEGATIVE NEGATIVE mg/dL   Protein, ur NEGATIVE NEGATIVE mg/dL   Nitrite NEGATIVE NEGATIVE   Leukocytes,Ua NEGATIVE NEGATIVE  Protein / creatinine ratio, urine     Status: None   Collection Time: 05/14/20  3:02 AM  Result Value Ref Range   Creatinine, Urine 19.57 mg/dL   Total Protein, Urine <6 mg/dL   Protein Creatinine Ratio        0.00 - 0.15 mg/mg[Cre]  Comprehensive metabolic panel     Status: Abnormal   Collection Time:  05/14/20  4:22 AM  Result Value Ref Range   Sodium 134 (L) 135 - 145 mmol/L   Potassium 3.4 (L) 3.5 - 5.1 mmol/L   Chloride 105 98 - 111 mmol/L   CO2 16 (L) 22 - 32 mmol/L   Glucose, Bld 160 (H) 70 - 99 mg/dL   BUN <5 (L) 6 - 20 mg/dL   Creatinine, Ser 0.59 0.44 - 1.00 mg/dL   Calcium 9.1 8.9 - 10.3 mg/dL   Total Protein 6.5 6.5 - 8.1 g/dL   Albumin 2.7 (L) 3.5 - 5.0 g/dL   AST 72 (H) 15 - 41 U/L   ALT 74 (H) 0 - 44  U/L   Alkaline Phosphatase 134 (H) 38 - 126 U/L   Total Bilirubin 0.4 0.3 - 1.2 mg/dL   GFR calc non Af Amer >60 >60 mL/min   GFR calc Af Amer >60 >60 mL/min   Anion gap 13 5 - 15  CBC     Status: Abnormal   Collection Time: 05/14/20  4:22 AM  Result Value Ref Range   WBC 18.8 (H) 4.0 - 10.5 K/uL   RBC 3.44 (L) 3.87 - 5.11 MIL/uL   Hemoglobin 11.2 (L) 12.0 - 15.0 g/dL   HCT 33.9 (L) 36.0 - 46.0 %   MCV 98.5 80.0 - 100.0 fL   MCH 32.6 26.0 - 34.0 pg   MCHC 33.0 30.0 - 36.0 g/dL   RDW 14.0 11.5 - 15.5 %   Platelets 306 150 - 400 K/uL   nRBC 0.3 (H) 0.0 - 0.2 %   US Abdomen Limited RUQ  Result Date: 05/14/2020 CLINICAL DATA:  Right upper quadrant abdominal pain. Third trimester of pregnancy. Elevated liver enzymes. EXAM: ULTRASOUND ABDOMEN LIMITED RIGHT UPPER QUADRANT COMPARISON:  None. FINDINGS: Gallbladder: No shadowing gallstones. Mild layering gallbladder sludge. No gallbladder wall thickening. No pericholecystic fluid. No sonographic Murphy sign. Common bile duct: Diameter: 2 mm Liver: No focal lesion identified. Within normal limits in parenchymal echogenicity. Portal vein is patent on color Doppler imaging with normal direction of blood flow towards the liver. Other: None. IMPRESSION: 1. Mild layering gallbladder sludge with no cholelithiasis and no evidence of acute cholecystitis. 2. No biliary ductal dilatation. 3. Normal liver. Electronically Signed   By: Ilona Sorrel M.D.   On: 05/14/2020 06:34    MDM Physical Exam Labs: CBC, CMP, PC Ratio Measure BPQ15  min EFM Start IV Protonix  Assessment and Plan  34 year old [id number]  SIUP at 34.5weeks Cat I FT Heartburn RUQ Pain  -POC reviewed. -Patient informed that she could be given IV medications for heartburn and agreeable. -Patient further agreeable to drawing of labs since IV would be started. -Labs ordered. -Will give Protonix 40 mg IV for heartburn. -Exam performed and findings discussed. -Will await results. -NST reactive.   Maryann Conners MSN, CNM 05/14/2020, 3:10 AM   Reassessment (3:42 AM)  -Nurse reports patient did not tolerate placement of IV. -Provider to bedside and patient willing to have labs drawn.  -IV orders cancelled. -Will give Mylanta and reassess. -Patient informed if pain remains, after Mylanta, provider will order pain medication.  Reassessment (5:06 AM)  -CBC and PC Ratio returns normal. -Provider to bedside to inform patient and results and that CMP Pending. -Informed that she will be discharged if results return normal.  -Instructed to follow up for regular OB as scheduled.  -Reassured that her cervical dilation is not reason for admission today especially in setting of preterm gestational age. -Precautions given regarding labor onset. -Patient verbalizes understanding.  -Await CMP.  Reassessment (5:25 AM) CMP returns with elevated AST/ALT -Dr. Sheryn Bison consulted and informed of patient status, interventions, and results.  After review of patient chart he advised: *Elevated bp appear to be Chronic state after other elevations noted prior to pregnancy. *Order Abdominal US for evaluation of gallbladder *Add-On Labs: INR, Fibrinogen, PTT, TSH, and Acute Hep Panel. *Provider to call back and discuss POC further once all results return.  -Nurse informed of POC -Okay to discontinue EFM as NST remains reactive.  -Cycle blood pressures every 30 minutes.  Reassessment (7:05 AM)  -Patient refused labs, but agreed to [place].  -  [place] returns with mild  gallbladder sludge noted. -Dr. Ilda Basset called with results of [place] and informed of patient refusal for labs.  Advises: *Place on Protonix 17m BID *Give information regarding low-fat diet *Recommendation for patient to return tomorrow for repeat CMP and drawing of INR, Fibrinogen, PTT, TSH, and Acute Hep. -Provider to bedside to review results of [place] discuss POC with patient. -Instructed to keep next regular scheduled appt for 6/3 with Dr. MGardiner Fanti -Discussed initiation of low fat-Info placed in AVS -Reviewed MD recommendation for repeat labs in am and patient states "I'm not coming back to this hospital until my baby is hanging halfway out." -Patient without further comments, questions, or concerns. -Encouraged to call or return to MAU if symptoms worsen or with the onset of new symptoms. -Discharged to home in stable condition.  JMaryann ConnersMSN, CNM [facility] Provider[facility]23 AM -Nurse call and states patient requests refill on albuterol inhaler for "panic attacks." -One refill sent for inhaler on file.   JMaryann ConnersMSN, CNM Advanced Practice Provider, [facility]

## 2020-05-14 NOTE — MAU Note (Signed)
Heartburn all day, can't eat because heartburn is so bad.  Under right breast pain for past 3 hours.  No bleeding. No leaking. Baby moving well.  Denies visual issues.  Denies headache.

## 2020-05-19 ENCOUNTER — Encounter: Payer: Self-pay | Admitting: Obstetrics and Gynecology

## 2020-05-19 ENCOUNTER — Other Ambulatory Visit: Payer: Self-pay

## 2020-05-19 ENCOUNTER — Ambulatory Visit (INDEPENDENT_AMBULATORY_CARE_PROVIDER_SITE_OTHER): Payer: Medicaid Other | Admitting: Obstetrics and Gynecology

## 2020-05-19 ENCOUNTER — Other Ambulatory Visit (HOSPITAL_COMMUNITY)
Admission: RE | Admit: 2020-05-19 | Discharge: 2020-05-19 | Disposition: A | Discharge status: Home / Self Care | Payer: Medicaid Other | Source: Ambulatory Visit | Attending: Obstetrics and Gynecology | Admitting: Obstetrics and Gynecology

## 2020-05-19 VITALS — BP 129/85 | HR 109 | Wt 230.1 lb

## 2020-05-19 DIAGNOSIS — O10919 Unspecified pre-existing hypertension complicating pregnancy, unspecified trimester: Secondary | ICD-10-CM

## 2020-05-19 DIAGNOSIS — R7989 Other specified abnormal findings of blood chemistry: Secondary | ICD-10-CM

## 2020-05-19 DIAGNOSIS — Z98891 History of uterine scar from previous surgery: Secondary | ICD-10-CM

## 2020-05-19 DIAGNOSIS — O09893 Supervision of other high risk pregnancies, third trimester: Secondary | ICD-10-CM

## 2020-05-19 DIAGNOSIS — O10013 Pre-existing essential hypertension complicating pregnancy, third trimester: Secondary | ICD-10-CM | POA: Diagnosis not present

## 2020-05-19 DIAGNOSIS — O09899 Supervision of other high risk pregnancies, unspecified trimester: Secondary | ICD-10-CM

## 2020-05-19 DIAGNOSIS — Z3A35 35 weeks gestation of pregnancy: Secondary | ICD-10-CM

## 2020-05-19 DIAGNOSIS — A5901 Trichomonal vulvovaginitis: Secondary | ICD-10-CM

## 2020-05-19 DIAGNOSIS — O34219 Maternal care for unspecified type scar from previous cesarean delivery: Secondary | ICD-10-CM

## 2020-05-19 DIAGNOSIS — O23593 Infection of other part of genital tract in pregnancy, third trimester: Secondary | ICD-10-CM

## 2020-05-19 NOTE — Progress Notes (Signed)
Subjective:  [name] is a 34 y.o. [phone] at 47w3dbeing seen today for ongoing prenatal care.  She is currently monitored for the following issues for this high-risk pregnancy and has Obesity, Class III, BMI 40-49.9 (morbid obesity) ([place]; Tobacco use; Trichomonal vaginitis during pregnancy in third trimester; Depression; Bipolar disorder ([place]; Hx of cesarean section; Schizophrenia ([place]; Hx successful VBAC (vaginal birth after cesarean), currently pregnant; Fetal echogenic intracardiac focus on prenatal ultrasound; Supervision of other high risk pregnancy, antepartum; Chronic hypertension in pregnancy; and Elevated liver function tests on their problem list.  Patient reports seen numerous times over the last week at [facility]. See [facility] notes for additional information. Today she denies HA, abd pain or visual changes. She denies ut ctx, VB or LOF..  Contractions: Not present. Vag. Bleeding: None.  Movement: Present. Denies leaking of fluid.   The following portions of the patient's history were reviewed and updated as appropriate: allergies, current medications, past family history, past medical history, past social history, past surgical history and problem list. Problem list updated.  Objective:   Vitals:   05/19/20 0928  BP: 129/85  Pulse: (!) 109  Weight: 230 lb 1.6 oz (104.4 kg)    Fetal Status: Fetal Heart Rate (bpm): 150   Movement: Present     General:  Alert, oriented and cooperative. Patient is in no acute distress.  Skin: Skin is warm and dry. No rash noted.   Cardiovascular: Normal heart rate noted  Respiratory: Normal respiratory effort, no problems with respiration noted  Abdomen: Soft, gravid, appropriate for gestational age. Pain/Pressure: Present     Pelvic:  Cervical exam performed        Extremities: Normal range of motion.  Edema: Trace  Mental Status: Normal mood and affect. Normal behavior. Normal judgment and thought content.   Urinalysis:      Assessment and  Plan:  Pregnancy: [id number] 314w3d1. Supervision of other high risk pregnancy, antepartum Stable Vaginal cultures obtained  2. Chronic hypertension in pregnancy BP stable today, no meds No Sx/S of PEC presently Reviewed PEC with pt and indications to present to [facility]U Will repeat labs today S/P BMZ x 2 U/S next week - Cervicovaginal ancillary only( [place]) - Strep Gp B NAA - CBC - Comp Met (CMET) - Hepatitis panel, acute - Protein / creatinine ratio, urine  3. Elevated liver function tests See above - Cervicovaginal ancillary only( [place]) - Strep Gp B NAA - CBC - Comp Met (CMET) - Hepatitis panel, acute - Protein / creatinine ratio, urine  4. Hx of cesarean section Desires TOLAC, consent signed  5. Hx successful VBAC (vaginal birth after cesarean), currently pregnant See above  6. Trichomonal vaginitis during pregnancy in third trimester S/P Tx will need TOC at later OB visit  Preterm labor symptoms and general obstetric precautions including but not limited to vaginal bleeding, contractions, leaking of fluid and fetal movement were reviewed in detail with the patient. Please refer to After Visit Summary for other counseling recommendations.  Return in about 1 week (around 05/26/2020) for face to face, MD only.   ErChancy MilroyMD

## 2020-05-19 NOTE — Progress Notes (Signed)
Pt is here for ROB, [redacted]w[redacted]d.  

## 2020-05-19 NOTE — Patient Instructions (Signed)
Third Trimester of Pregnancy The third trimester is from week 28 through week 40 (months 7 through 9). The third trimester is a time when the unborn baby (fetus) is growing rapidly. At the end of the ninth month, the fetus is about 20 inches in length and weighs 6-10 pounds. Body changes during your third trimester Your body will continue to go through many changes during pregnancy. The changes vary from woman to woman. During the third trimester:  Your weight will continue to increase. You can expect to gain 25-35 pounds (11-16 kg) by the end of the pregnancy.  You may begin to get stretch marks on your hips, abdomen, and breasts.  You may urinate more often because the fetus is moving lower into your pelvis and pressing on your bladder.  You may develop or continue to have heartburn. This is caused by increased hormones that slow down muscles in the digestive tract.  You may develop or continue to have constipation because increased hormones slow digestion and cause the muscles that push waste through your intestines to relax.  You may develop hemorrhoids. These are swollen veins (varicose veins) in the rectum that can itch or be painful.  You may develop swollen, bulging veins (varicose veins) in your legs.  You may have increased body aches in the pelvis, back, or thighs. This is due to weight gain and increased hormones that are relaxing your joints.  You may have changes in your hair. These can include thickening of your hair, rapid growth, and changes in texture. Some women also have hair loss during or after pregnancy, or hair that feels dry or thin. Your hair will most likely return to normal after your baby is born.  Your breasts will continue to grow and they will continue to become tender. A yellow fluid (colostrum) may leak from your breasts. This is the first milk you are producing for your baby.  Your belly button may stick out.  You may notice more swelling in your hands,  face, or ankles.  You may have increased tingling or numbness in your hands, arms, and legs. The skin on your belly may also feel numb.  You may feel short of breath because of your expanding uterus.  You may have more problems sleeping. This can be caused by the size of your belly, increased need to urinate, and an increase in your body's metabolism.  You may notice the fetus "dropping," or moving lower in your abdomen (lightening).  You may have increased vaginal discharge.  You may notice your joints feel loose and you may have pain around your pelvic bone. What to expect at prenatal visits You will have prenatal exams every 2 weeks until week 36. Then you will have weekly prenatal exams. During a routine prenatal visit:  You will be weighed to make sure you and the baby are growing normally.  Your blood pressure will be taken.  Your abdomen will be measured to track your baby's growth.  The fetal heartbeat will be listened to.  Any test results from the previous visit will be discussed.  You may have a cervical check near your due date to see if your cervix has softened or thinned (effaced).  You will be tested for Group B streptococcus. This happens between 35 and 37 weeks. Your health care provider may ask you:  What your birth plan is.  How you are feeling.  If you are feeling the baby move.  If you have had any abnormal   symptoms, such as leaking fluid, bleeding, severe headaches, or abdominal cramping.  If you are using any tobacco products, including cigarettes, chewing tobacco, and electronic cigarettes.  If you have any questions. Other tests or screenings that may be performed during your third trimester include:  Blood tests that check for low iron levels (anemia).  Fetal testing to check the health, activity level, and growth of the fetus. Testing is done if you have certain medical conditions or if there are problems during the pregnancy.  Nonstress test  (NST). This test checks the health of your baby to make sure there are no signs of problems, such as the baby not getting enough oxygen. During this test, a belt is placed around your belly. The baby is made to move, and its heart rate is monitored during movement. What is false labor? False labor is a condition in which you feel small, irregular tightenings of the muscles in the womb (contractions) that usually go away with rest, changing position, or drinking water. These are called Braxton Hicks contractions. Contractions may last for hours, days, or even weeks before true labor sets in. If contractions come at regular intervals, become more frequent, increase in intensity, or become painful, you should see your health care provider. What are the signs of labor?  Abdominal cramps.  Regular contractions that start at 10 minutes apart and become stronger and more frequent with time.  Contractions that start on the top of the uterus and spread down to the lower abdomen and back.  Increased pelvic pressure and dull back pain.  A watery or bloody mucus discharge that comes from the vagina.  Leaking of amniotic fluid. This is also known as your "water breaking." It could be a slow trickle or a gush. Let your health care provider know if it has a color or strange odor. If you have any of these signs, call your health care provider right away, even if it is before your due date. Follow these instructions at home: Medicines  Follow your health care provider's instructions regarding medicine use. Specific medicines may be either safe or unsafe to take during pregnancy.  Take a prenatal vitamin that contains at least 600 micrograms (mcg) of folic acid.  If you develop constipation, try taking a stool softener if your health care provider approves. Eating and drinking   Eat a balanced diet that includes fresh fruits and vegetables, whole grains, good sources of protein such as meat, eggs, or tofu,  and low-fat dairy. Your health care provider will help you determine the amount of weight gain that is right for you.  Avoid raw meat and uncooked cheese. These carry germs that can cause birth defects in the baby.  If you have low calcium intake from food, talk to your health care provider about whether you should take a daily calcium supplement.  Eat four or five small meals rather than three large meals a day.  Limit foods that are high in fat and processed sugars, such as fried and sweet foods.  To prevent constipation: ? Drink enough fluid to keep your urine clear or pale yellow. ? Eat foods that are high in fiber, such as fresh fruits and vegetables, whole grains, and beans. Activity  Exercise only as directed by your health care provider. Most women can continue their usual exercise routine during pregnancy. Try to exercise for 30 minutes at least 5 days a week. Stop exercising if you experience uterine contractions.  Avoid heavy lifting.  Do   not exercise in extreme heat or humidity, or at high altitudes.  Wear low-heel, comfortable shoes.  Practice good posture.  You may continue to have sex unless your health care provider tells you otherwise. Relieving pain and discomfort  Take frequent breaks and rest with your legs elevated if you have leg cramps or low back pain.  Take warm sitz baths to soothe any pain or discomfort caused by hemorrhoids. Use hemorrhoid cream if your health care provider approves.  Wear a good support bra to prevent discomfort from breast tenderness.  If you develop varicose veins: ? Wear support pantyhose or compression stockings as told by your healthcare provider. ? Elevate your feet for 15 minutes, 3-4 times a day. Prenatal care  Write down your questions. Take them to your prenatal visits.  Keep all your prenatal visits as told by your health care provider. This is important. Safety  Wear your seat belt at all times when driving.  Make  a list of emergency phone numbers, including numbers for family, friends, the hospital, and police and fire departments. General instructions  Avoid cat litter boxes and soil used by cats. These carry germs that can cause birth defects in the baby. If you have a cat, ask someone to clean the litter box for you.  Do not travel far distances unless it is absolutely necessary and only with the approval of your health care provider.  Do not use hot tubs, steam rooms, or saunas.  Do not drink alcohol.  Do not use any products that contain nicotine or tobacco, such as cigarettes and e-cigarettes. If you need help quitting, ask your health care provider.  Do not use any medicinal herbs or unprescribed drugs. These chemicals affect the formation and growth of the baby.  Do not douche or use tampons or scented sanitary pads.  Do not cross your legs for long periods of time.  To prepare for the arrival of your baby: ? Take prenatal classes to understand, practice, and ask questions about labor and delivery. ? Make a trial run to the hospital. ? Visit the hospital and tour the maternity area. ? Arrange for maternity or paternity leave through employers. ? Arrange for family and friends to take care of pets while you are in the hospital. ? Purchase a rear-facing car seat and make sure you know how to install it in your car. ? Pack your hospital bag. ? Prepare the baby's nursery. Make sure to remove all pillows and stuffed animals from the baby's crib to prevent suffocation.  Visit your dentist if you have not gone during your pregnancy. Use a soft toothbrush to brush your teeth and be gentle when you floss. Contact a health care provider if:  You are unsure if you are in labor or if your water has broken.  You become dizzy.  You have mild pelvic cramps, pelvic pressure, or nagging pain in your abdominal area.  You have lower back pain.  You have persistent nausea, vomiting, or  diarrhea.  You have an unusual or bad smelling vaginal discharge.  You have pain when you urinate. Get help right away if:  Your water breaks before 37 weeks.  You have regular contractions less than 5 minutes apart before 37 weeks.  You have a fever.  You are leaking fluid from your vagina.  You have spotting or bleeding from your vagina.  You have severe abdominal pain or cramping.  You have rapid weight loss or weight gain.  You have   shortness of breath with chest pain.  You notice sudden or extreme swelling of your face, hands, ankles, feet, or legs.  Your baby makes fewer than 10 movements in 2 hours.  You have severe headaches that do not go away when you take medicine.  You have vision changes. Summary  The third trimester is from week 28 through week 40, months 7 through 9. The third trimester is a time when the unborn baby (fetus) is growing rapidly.  During the third trimester, your discomfort may increase as you and your baby continue to gain weight. You may have abdominal, leg, and back pain, sleeping problems, and an increased need to urinate.  During the third trimester your breasts will keep growing and they will continue to become tender. A yellow fluid (colostrum) may leak from your breasts. This is the first milk you are producing for your baby.  False labor is a condition in which you feel small, irregular tightenings of the muscles in the womb (contractions) that eventually go away. These are called Braxton Hicks contractions. Contractions may last for hours, days, or even weeks before true labor sets in.  Signs of labor can include: abdominal cramps; regular contractions that start at 10 minutes apart and become stronger and more frequent with time; watery or bloody mucus discharge that comes from the vagina; increased pelvic pressure and dull back pain; and leaking of amniotic fluid. This information is not intended to replace advice given to you by your  health care provider. Make sure you discuss any questions you have with your health care provider. Document Revised: 03/26/2019 Document Reviewed: 01/08/2017 Elsevier Patient Education  2020 [employer] Inc[employer]  

## 2020-05-20 LAB — COMPREHENSIVE METABOLIC PANEL
ALT: 30 IU/L (ref 0–32)
AST: 23 IU/L (ref 0–40)
Albumin/Globulin Ratio: 1.1 — ABNORMAL LOW (ref 1.2–2.2)
Albumin: 3.2 g/dL — ABNORMAL LOW (ref 3.8–4.8)
Alkaline Phosphatase: 176 IU/L — ABNORMAL HIGH (ref 48–121)
BUN/Creatinine Ratio: 3 — ABNORMAL LOW (ref 9–23)
BUN: 2 mg/dL — ABNORMAL LOW (ref 6–20)
Bilirubin Total: 0.2 mg/dL (ref 0.0–1.2)
CO2: 19 mmol/L — ABNORMAL LOW (ref 20–29)
Calcium: 9.1 mg/dL (ref 8.7–10.2)
Chloride: 105 mmol/L (ref 96–106)
Creatinine, Ser: 0.6 mg/dL (ref 0.57–1.00)
GFR calc Af Amer: 138 mL/min/{1.73_m2} (ref >59)
GFR calc non Af Amer: 119 mL/min/{1.73_m2} (ref >59)
Globulin, Total: 2.8 g/dL (ref 1.5–4.5)
Glucose: 125 mg/dL — ABNORMAL HIGH (ref 65–99)
Potassium: 3.9 mmol/L (ref 3.5–5.2)
Sodium: 137 mmol/L (ref 134–144)
Total Protein: 6 g/dL (ref 6.0–8.5)

## 2020-05-20 LAB — HEPATITIS PANEL, ACUTE
Hep A IgM: NEGATIVE
Hep B C IgM: NEGATIVE
Hep C Virus Ab: <0.1 s/co ratio (ref 0.0–0.9)
Hepatitis B Surface Ag: NEGATIVE

## 2020-05-20 LAB — CBC
Hematocrit: 32.5 % — ABNORMAL LOW (ref 34.0–46.6)
Hemoglobin: 11.4 g/dL (ref 11.1–15.9)
MCH: 33 pg (ref 26.6–33.0)
MCHC: 35.1 g/dL (ref 31.5–35.7)
MCV: 94 fL (ref 79–97)
Platelets: 300 10*3/uL (ref 150–450)
RBC: 3.45 x10E6/uL — ABNORMAL LOW (ref 3.77–5.28)
RDW: 12.3 % (ref 11.7–15.4)
WBC: 16.6 10*3/uL — ABNORMAL HIGH (ref 3.4–10.8)

## 2020-05-20 LAB — CERVICOVAGINAL ANCILLARY ONLY
Chlamydia: NEGATIVE
Comment: NEGATIVE
Comment: NORMAL
Neisseria Gonorrhea: NEGATIVE

## 2020-05-20 LAB — PROTEIN / CREATININE RATIO, URINE
Creatinine, Urine: 40.9 mg/dL
Protein, Ur: 12.5 mg/dL
Protein/Creat Ratio: 306 mg/g creat — ABNORMAL HIGH (ref 0–200)

## 2020-05-21 LAB — STREP GP B NAA: Strep Gp B NAA: POSITIVE — AB

## 2020-05-23 ENCOUNTER — Encounter: Payer: Self-pay | Admitting: Obstetrics and Gynecology

## 2020-05-23 DIAGNOSIS — O99820 Streptococcus B carrier state complicating pregnancy: Secondary | ICD-10-CM

## 2020-05-23 HISTORY — DX: Streptococcus B carrier state complicating pregnancy: O99.820

## 2020-05-24 ENCOUNTER — Ambulatory Visit: Payer: Medicaid Other

## 2020-05-26 ENCOUNTER — Encounter: Payer: Medicaid Other | Admitting: Obstetrics & Gynecology

## 2020-05-29 ENCOUNTER — Other Ambulatory Visit: Payer: Self-pay

## 2020-05-29 ENCOUNTER — Inpatient Hospital Stay (HOSPITAL_COMMUNITY)
Admission: EM | Admit: 2020-05-29 | Discharge: 2020-05-31 | DRG: 806 | Disposition: A | Discharge status: Home / Self Care | Payer: Medicaid Other | Attending: Family Medicine | Admitting: Family Medicine

## 2020-05-29 ENCOUNTER — Encounter (HOSPITAL_COMMUNITY): Payer: Self-pay | Admitting: Family Medicine

## 2020-05-29 DIAGNOSIS — O1002 Pre-existing essential hypertension complicating childbirth: Secondary | ICD-10-CM | POA: Diagnosis present

## 2020-05-29 DIAGNOSIS — O09299 Supervision of pregnancy with other poor reproductive or obstetric history, unspecified trimester: Secondary | ICD-10-CM

## 2020-05-29 DIAGNOSIS — Z20822 Contact with and (suspected) exposure to covid-19: Secondary | ICD-10-CM | POA: Diagnosis present

## 2020-05-29 DIAGNOSIS — Z3A36 36 weeks gestation of pregnancy: Secondary | ICD-10-CM

## 2020-05-29 DIAGNOSIS — O26893 Other specified pregnancy related conditions, third trimester: Secondary | ICD-10-CM | POA: Diagnosis present

## 2020-05-29 DIAGNOSIS — O99324 Drug use complicating childbirth: Secondary | ICD-10-CM | POA: Diagnosis present

## 2020-05-29 DIAGNOSIS — O34219 Maternal care for unspecified type scar from previous cesarean delivery: Secondary | ICD-10-CM | POA: Diagnosis present

## 2020-05-29 DIAGNOSIS — O99824 Streptococcus B carrier state complicating childbirth: Secondary | ICD-10-CM | POA: Diagnosis present

## 2020-05-29 DIAGNOSIS — O09899 Supervision of other high risk pregnancies, unspecified trimester: Secondary | ICD-10-CM

## 2020-05-29 DIAGNOSIS — O23593 Infection of other part of genital tract in pregnancy, third trimester: Secondary | ICD-10-CM

## 2020-05-29 DIAGNOSIS — O99214 Obesity complicating childbirth: Secondary | ICD-10-CM | POA: Diagnosis present

## 2020-05-29 DIAGNOSIS — Z87891 Personal history of nicotine dependence: Secondary | ICD-10-CM

## 2020-05-29 DIAGNOSIS — O10919 Unspecified pre-existing hypertension complicating pregnancy, unspecified trimester: Secondary | ICD-10-CM

## 2020-05-29 DIAGNOSIS — F1490 Cocaine use, unspecified, uncomplicated: Secondary | ICD-10-CM | POA: Diagnosis present

## 2020-05-29 HISTORY — DX: Supervision of pregnancy with other poor reproductive or obstetric history, unspecified trimester: O09.299

## 2020-05-29 LAB — RAPID URINE DRUG SCREEN, HOSP PERFORMED
Amphetamines: NOT DETECTED
Barbiturates: NOT DETECTED
Benzodiazepines: NOT DETECTED
Cocaine: POSITIVE — AB
Opiates: NOT DETECTED
Tetrahydrocannabinol: POSITIVE — AB

## 2020-05-29 LAB — PROTEIN / CREATININE RATIO, URINE
Creatinine, Urine: 231.87 mg/dL
Protein Creatinine Ratio: 0.28 mg/mg{Cre} — ABNORMAL HIGH (ref 0.00–0.15)
Total Protein, Urine: 65 mg/dL

## 2020-05-29 LAB — COMPREHENSIVE METABOLIC PANEL
ALT: 42 U/L (ref 0–44)
AST: 53 U/L — ABNORMAL HIGH (ref 15–41)
Albumin: 2.2 g/dL — ABNORMAL LOW (ref 3.5–5.0)
Alkaline Phosphatase: 157 U/L — ABNORMAL HIGH (ref 38–126)
Anion gap: 8 (ref 5–15)
BUN: <5 mg/dL — ABNORMAL LOW (ref 6–20)
CO2: 20 mmol/L — ABNORMAL LOW (ref 22–32)
Calcium: 9.1 mg/dL (ref 8.9–10.3)
Chloride: 106 mmol/L (ref 98–111)
Creatinine, Ser: 0.67 mg/dL (ref 0.44–1.00)
GFR calc Af Amer: >60 mL/min (ref >60)
GFR calc non Af Amer: >60 mL/min (ref >60)
Glucose, Bld: 136 mg/dL — ABNORMAL HIGH (ref 70–99)
Potassium: 3.8 mmol/L (ref 3.5–5.1)
Sodium: 134 mmol/L — ABNORMAL LOW (ref 135–145)
Total Bilirubin: 0.4 mg/dL (ref 0.3–1.2)
Total Protein: 5.7 g/dL — ABNORMAL LOW (ref 6.5–8.1)

## 2020-05-29 LAB — SARS CORONAVIRUS 2 BY RT PCR (HOSPITAL ORDER, PERFORMED IN ~~LOC~~ HOSPITAL LAB): SARS Coronavirus 2: NEGATIVE

## 2020-05-29 MED ORDER — OXYTOCIN-SODIUM CHLORIDE 30-0.9 UT/500ML-% IV SOLN: 2.5000 [IU]/h | INTRAVENOUS | Status: DC

## 2020-05-29 MED ORDER — SIMETHICONE 80 MG PO CHEW: 80.0000 mg | CHEWABLE_TABLET | ORAL | Status: DC | PRN

## 2020-05-29 MED ORDER — LACTATED RINGERS IV SOLN: INTRAVENOUS | Status: DC

## 2020-05-29 MED ORDER — ZOLPIDEM TARTRATE 5 MG PO TABS: 5.0000 mg | ORAL_TABLET | Freq: Every evening | ORAL | Status: DC | PRN

## 2020-05-29 MED ORDER — ACETAMINOPHEN 325 MG PO TABS: 650.0000 mg | ORAL_TABLET | ORAL | Status: DC | PRN

## 2020-05-29 MED ORDER — OXYCODONE-ACETAMINOPHEN 5-325 MG PO TABS
1.0000 | ORAL_TABLET | ORAL | Status: DC | PRN
  Administered 2020-05-29 – 2020-05-31 (×5): 1 via ORAL
  Filled 2020-05-29 (×5): qty 1

## 2020-05-29 MED ORDER — LACTATED RINGERS IV SOLN: 500.0000 mL | INTRAVENOUS | Status: DC | PRN

## 2020-05-29 MED ORDER — ONDANSETRON HCL 4 MG/2ML IJ SOLN: 4.0000 mg | Freq: Four times a day (QID) | INTRAMUSCULAR | Status: DC | PRN

## 2020-05-29 MED ORDER — SODIUM CHLORIDE 0.9 % IV SOLN
2.0000 g | Freq: Once | INTRAVENOUS | Status: AC
  Administered 2020-05-29: 2 g via INTRAVENOUS
  Filled 2020-05-29: qty 2000

## 2020-05-29 MED ORDER — FENTANYL CITRATE (PF) 100 MCG/2ML IJ SOLN
50.0000 ug | INTRAMUSCULAR | Status: DC | PRN
  Administered 2020-05-29: 50 ug via INTRAVENOUS
  Filled 2020-05-29: qty 2

## 2020-05-29 MED ORDER — BENZOCAINE-MENTHOL 20-0.5 % EX AERO
1.0000 "application " | INHALATION_SPRAY | CUTANEOUS | Status: DC | PRN
  Administered 2020-05-29: 1 via TOPICAL
  Filled 2020-05-29: qty 56

## 2020-05-29 MED ORDER — LIDOCAINE HCL (PF) 1 % IJ SOLN
INTRAMUSCULAR | Status: AC
  Filled 2020-05-29: qty 30

## 2020-05-29 MED ORDER — DIPHENHYDRAMINE HCL 25 MG PO CAPS: 25.0000 mg | ORAL_CAPSULE | Freq: Four times a day (QID) | ORAL | Status: DC | PRN

## 2020-05-29 MED ORDER — OXYTOCIN-SODIUM CHLORIDE 30-0.9 UT/500ML-% IV SOLN
INTRAVENOUS | Status: AC
  Filled 2020-05-29: qty 1000

## 2020-05-29 MED ORDER — LIDOCAINE HCL (PF) 1 % IJ SOLN
30.0000 mL | INTRAMUSCULAR | Status: AC | PRN
  Administered 2020-05-29: 30 mL via SUBCUTANEOUS

## 2020-05-29 MED ORDER — IBUPROFEN 600 MG PO TABS
600.0000 mg | ORAL_TABLET | Freq: Four times a day (QID) | ORAL | Status: DC
  Administered 2020-05-29 – 2020-05-31 (×7): 600 mg via ORAL
  Filled 2020-05-29 (×8): qty 1

## 2020-05-29 MED ORDER — COCONUT OIL OIL: 1.0000 "application " | TOPICAL_OIL | Status: DC | PRN

## 2020-05-29 MED ORDER — SOD CITRATE-CITRIC ACID 500-334 MG/5ML PO SOLN: 30.0000 mL | ORAL | Status: DC | PRN

## 2020-05-29 MED ORDER — LIDOCAINE HCL (PF) 1 % IJ SOLN
INTRAMUSCULAR | Status: AC
  Administered 2020-05-29: 30 mL
  Filled 2020-05-29: qty 30

## 2020-05-29 MED ORDER — ONDANSETRON HCL 4 MG/2ML IJ SOLN: 4.0000 mg | INTRAMUSCULAR | Status: DC | PRN

## 2020-05-29 MED ORDER — OXYTOCIN BOLUS FROM INFUSION
500.0000 mL | Freq: Once | INTRAVENOUS | Status: AC
  Administered 2020-05-29: 500 mL via INTRAVENOUS

## 2020-05-29 MED ORDER — DIBUCAINE (PERIANAL) 1 % EX OINT: 1.0000 "application " | TOPICAL_OINTMENT | CUTANEOUS | Status: DC | PRN

## 2020-05-29 MED ORDER — ONDANSETRON HCL 4 MG PO TABS: 4.0000 mg | ORAL_TABLET | ORAL | Status: DC | PRN

## 2020-05-29 MED ORDER — PRENATAL MULTIVITAMIN CH
1.0000 | ORAL_TABLET | Freq: Every day | ORAL | Status: DC
  Administered 2020-05-30: 1 via ORAL
  Filled 2020-05-29 (×2): qty 1

## 2020-05-29 MED ORDER — TETANUS-DIPHTH-ACELL PERTUSSIS 5-2.5-18.5 LF-MCG/0.5 IM SUSP
0.5000 mL | Freq: Once | INTRAMUSCULAR | Status: AC
  Administered 2020-05-30: 0.5 mL via INTRAMUSCULAR
  Filled 2020-05-29 (×2): qty 0.5

## 2020-05-29 MED ORDER — WITCH HAZEL-GLYCERIN EX PADS: 1.0000 "application " | MEDICATED_PAD | CUTANEOUS | Status: DC | PRN

## 2020-05-29 NOTE — Progress Notes (Signed)
Pt IO cathed for protein creat. Ratio pt understands need to cath her for this urine bc she is bleeding. Pt agreeable.

## 2020-05-29 NOTE — Discharge Summary (Addendum)
Postpartum Discharge Summary     Patient Name: [name] DOB: 1986-03-21 MRN: [id number]  Date of admission: 05/29/2020 Delivery date:05/29/2020  Delivering provider: [place]  Date of discharge: 05/31/2020  Admitting diagnosis: Normal labor [O80, Z37.9] Intrauterine pregnancy: [redacted]w[redacted]d    Secondary diagnosis:  Active Problems:   Normal labor  Additional problems:  Patient Active Problem List   Diagnosis Date Noted   Normal labor 05/29/2020   GBS (group B Streptococcus carrier), +RV culture, currently pregnant 05/23/2020   Elevated liver function tests 05/14/2020   Chronic hypertension in pregnancy 05/12/2020   Supervision of other high risk pregnancy, antepartum 03/09/2020   Trichomonal vaginitis during pregnancy in third trimester 02/08/2020   Depression 02/08/2020   Bipolar disorder ([place] 02/08/2020   Hx of cesarean section 02/08/2020   Schizophrenia ([place] 02/08/2020   Hx successful VBAC (vaginal birth after cesarean), currently pregnant 02/08/2020   Fetal echogenic intracardiac focus on prenatal ultrasound 02/08/2020   Obesity, Class III, BMI 40-49.9 (morbid obesity) ([place] 03/06/2017   Tobacco use 03/06/2017        Discharge diagnosis: Preterm Pregnancy Delivered                                              Post partum procedures: None Augmentation:  None Complications: None  Hospital course: Onset of Labor With Vaginal Delivery      34y.o. yo [phone] 333w6das admitted in Active Labor on 05/29/2020. Patient had an uncomplicated labor course as follows:  Patient arrived via EMS with complaints of ctx that started last night at 7 pm. Cervix was 8 cm in [facility]U with bulging bag; transferred to L and D with imminent delivery expected.  Right and left sulcal tears, repaired with 3-0 vicryl. Untreated GBS, Membrane Rupture Time/Date: 12:11 PM ,05/29/2020   Delivery Method:Vaginal, Spontaneous  Episiotomy: None  Lacerations:  Sulcus  Patient had  an uncomplicated postpartum course. Her social situation (living in a shelter, + cocaine upon admission) have been addressed by SW and CPS. She is ambulating, tolerating a regular diet, passing flatus, and urinating well. Patient is discharged in stable condition on 05/31/20. May room in with baby if baby not discharged  Newborn Data: Birth date:05/29/2020  Birth time:12:18 PM  Gender:Female  Living status:Living  Apgars:9 ,9  Weight:2705 g   Magnesium Sulfate received: No BMZ received: Yes 5/26 and 5/27 Rhophylac:No MMR:No T-DaP:Given postpartum Flu: No Transfusion:No  Physical exam  Vitals:   05/29/20 1148 05/29/20 1249 05/29/20 1301 05/29/20 1316  BP: (!) 132/104 (!) 141/99 137/88 (!) 112/96  Pulse: (!) 103 95 90 100   General: alert, cooperative and no distress Lochia: appropriate Uterine Fundus: firm Incision: N/A DVT Evaluation: No evidence of DVT seen on physical exam. Labs: Lab Results  Component Value Date   WBC 16.6 (H) 05/19/2020   HGB 11.4 05/19/2020   HCT 32.5 (L) 05/19/2020   MCV 94 05/19/2020   PLT 300 05/19/2020   CMP Latest Ref Rng & Units 05/19/2020  Glucose 65 - 99 mg/dL 125(H)  BUN 6 - 20 mg/dL 2(L)  Creatinine 0.57 - 1.00 mg/dL 0.60  Sodium 134 - 144 mmol/L 137  Potassium 3.5 - 5.2 mmol/L 3.9  Chloride 96 - 106 mmol/L 105  CO2 20 - 29 mmol/L 19(L)  Calcium 8.7 - 10.2 mg/dL 9.1  Total  Protein 6.0 - 8.5 g/dL 6.0  Total Bilirubin 0.0 - 1.2 mg/dL 0.2  Alkaline Phos 48 - 121 IU/L 176(H)  AST 0 - 40 IU/L 23  ALT 0 - 32 IU/L 30   Edinburgh Score: No flowsheet data found.   After visit meds:  Allergies as of 05/31/2020   No Known Allergies      Medication List     STOP taking these medications    aspirin EC 81 MG tablet       TAKE these medications    acetaminophen 325 MG tablet Commonly known as: Tylenol Take 2 tablets (650 mg total) by mouth every 4 (four) hours as needed (for pain scale < 4).   albuterol 108 (90 Base) MCG/ACT  inhaler Commonly known as: VENTOLIN HFA Inhale 1-2 puffs into the lungs every 6 (six) hours as needed for wheezing or shortness of breath.   ibuprofen 600 MG tablet Commonly known as: ADVIL Take 1 tablet (600 mg total) by mouth every 6 (six) hours.   pantoprazole 20 MG tablet Commonly known as: PROTONIX Take 1 tablet (20 mg total) by mouth 2 (two) times daily.   Vitafol Ultra 29-0.6-0.4-200 MG Caps Take 1 capsule by mouth daily before breakfast.         Discharge home in stable condition Infant Feeding: Bottle Infant Disposition: Pending, per CPS Discharge instruction: per After Visit Summary and Postpartum booklet. Activity: Advance as tolerated. Pelvic rest for 6 weeks.  Diet: routine diet Future Appointments:No future appointments. Follow up Visit:   Please schedule this patient for a In person postpartum visit in 4 weeks with the following provider: MD. Additional Postpartum F/U: pap   High risk pregnancy complicated by:  Insufficient prenatal care; refused glucose test.  Delivery mode:  Vaginal, Spontaneous  Anticipated Birth Control:   OCP Please schedule patient for appt with Vesta Mixer   [facility] Madelin Headings, MD PGY-2 Resident Family Medicine 05/31/2020, 8:04 AM  I personally saw and evaluated the patient, performing the key elements of the service. I developed and verified the management plan that is described in the resident's/student's note, and I agree with the content with my edits above. VSS, HRR&R, Resp unlabored, Legs neg.  Nigel Berthold, CNM 05/31/2020 10:16 AM

## 2020-05-29 NOTE — Progress Notes (Signed)
No now refusing anymore fundal checks. Explained what the purpose of fundal checks are pt still refuses understands she could bleed to much if we are not rubbing her fundus.

## 2020-05-29 NOTE — H&P (Signed)
OBSTETRIC ADMISSION HISTORY AND PHYSICAL  [name] is a 34 y.o. female [phone] with IUP at [redacted]w[redacted]d presenting for normal labor. She reports +FMs. No LOF, VB, blurry vision, headaches, peripheral edema, or RUQ pain. She plans on bottle feeding. She requests OCP for birth control.   Dating: By third trimester UIS --->  Estimated Date of Delivery: 06/20/20  Sono:    @*31w4 d, normal anatomy,  presentation, 1769g, 20%   Prenatal History/Complications: Chronic HTN, diagnosed 5/27- refused Pre-E labs H/o Cesarean delivery H/o VBAC x2 Insufficient prenatal care Currently living in [facility] shelter    Past Medical History: Past Medical History:  Diagnosis Date  . Asthma   . Obesity     Past Surgical History: Past Surgical History:  Procedure Laterality Date  . BREAST BIOPSY Left 07/31/2013   Procedure: IRRIGATION AND DEBRIDMENT LEFT BREAST ABSCESS;  Surgeon: Madilyn Hook, DO;  Location: [facility];  Service: General;  Laterality: Left;  IRRIGATION AND DEBRIDEMENT LEFT BREAST ABSCESS  . CESAREAN SECTION      Obstetrical History: OB History    Gravida  4   Para  3   Term  0   Preterm  3   AB      Living  2     SAB      TAB      Ectopic      Multiple  0   Live Births  3           Social History: Social History   Socioeconomic History  . Marital status: Single    Spouse name: Not on file  . Number of children: Not on file  . Years of education: Not on file  . Highest education level: Not on file  Occupational History  . Not on file  Tobacco Use  . Smoking status: Former Smoker    Packs/day: 1.00    Types: Cigarettes  . Smokeless tobacco: Never Used  . Tobacco comment: rare   Vaping Use  . Vaping Use: Never used  Substance and Sexual Activity  . Alcohol use: No  . Drug use: Never  . Sexual activity: Yes    Birth control/protection: None  Other Topics Concern  . Not on file  Social History Narrative  . Not on file   Social Determinants  of Health   Financial Resource Strain:   . Difficulty of Paying Living Expenses:   Food Insecurity:   . Worried About Charity fundraiser in the Last Year:   . Arboriculturist in the Last Year:   Transportation Needs:   . Film/video editor (Medical):   Marland Kitchen Lack of Transportation (Non-Medical):   Physical Activity:   . Days of Exercise per Week:   . Minutes of Exercise per Session:   Stress:   . Feeling of Stress :   Social Connections:   . Frequency of Communication with Friends and Family:   . Frequency of Social Gatherings with Friends and Family:   . Attends Religious Services:   . Active Member of Clubs or Organizations:   . Attends Archivist Meetings:   Marland Kitchen Marital Status:     Family History: Family History  Problem Relation Age of Onset  . Diabetes Father     Allergies: No Known Allergies  Medications Prior to Admission  Medication Sig Dispense Refill Last Dose  . albuterol (VENTOLIN HFA) 108 (90 Base) MCG/ACT inhaler Inhale 1-2 puffs into the lungs every 6 (six) hours  as needed for wheezing or shortness of breath. 6.7 g 0   . aspirin EC 81 MG tablet Take 1 tablet (81 mg total) by mouth daily. Take after 12 weeks for prevention of preeclampsia later in pregnancy (Patient not taking: Reported on 05/05/2020) 300 tablet 2   . pantoprazole (PROTONIX) 20 MG tablet Take 1 tablet (20 mg total) by mouth 2 (two) times daily. 60 tablet 0   . Prenat-Fe Poly-Methfol-FA-DHA (VITAFOL ULTRA) 29-0.6-0.4-200 MG CAPS Take 1 capsule by mouth daily before breakfast. 90 capsule 4      Review of Systems:  All systems reviewed and negative except as stated in HPI  PE: unknown if currently breastfeeding. General appearance: uncomfortable with contractions Lungs: regular rate and effort Heart: regular rate  Abdomen: soft, non-tender Extremities: Homans sign is negative, no sign of DVT Presentation: cephalic EFM: 145 bpm, moderate variability, no accels, no decels Toco:  CTX q3 min Dilation: 8.5 Exam by:: Ronnel Zuercher cnm  Prenatal labs: ABO, Rh: B/Positive/-- (05/06 1551) Antibody: Negative (05/06 1551) Rubella: <0.90 (05/06 1551) RPR: Non Reactive (05/06 1551)  HBsAg: Negative (06/03 1041)  HIV: Non Reactive (05/06 1551)  GBS: Positive/-- (06/03 1030)  2 hr GTT not done  Prenatal Transfer Tool  Maternal Diabetes: unknown, has had elevated glucose on CMPs Genetic Screening: Declined Maternal Ultrasounds/Referrals: Isolated EIF (echogenic intracardiac focus) Fetal Ultrasounds or other Referrals:  Referred to Materal Fetal Medicine  Maternal Substance Abuse:  No Significant Maternal Medications:  None Significant Maternal Lab Results: Group B Strep positive  No results found for this or any previous visit (from the past 24 hour(s)).  Patient Active Problem List   Diagnosis Date Noted  . GBS (group B Streptococcus carrier), +RV culture, currently pregnant 05/23/2020  . Elevated liver function tests 05/14/2020  . Chronic hypertension in pregnancy 05/12/2020  . Supervision of other high risk pregnancy, antepartum 03/09/2020  . Trichomonal vaginitis during pregnancy in third trimester 02/08/2020  . Depression 02/08/2020  . Bipolar disorder (HCC) 02/08/2020  . Hx of cesarean section 02/08/2020  . Schizophrenia (HCC) 02/08/2020  . Hx successful VBAC (vaginal birth after cesarean), currently pregnant 02/08/2020  . Fetal echogenic intracardiac focus on prenatal ultrasound 02/08/2020  . Obesity, Class III, BMI 40-49.9 (morbid obesity) (HCC) 03/06/2017  . Tobacco use 03/06/2017    Assessment: [name] is a 34 y.o. [phone] at [redacted]w[redacted]d here for SOL  1. Labor: expectant management 2. FWB: Cat I 3. Pain: per patient request 4. GBS: positive- AMP ordered 5. TOLAC: VBAC consent signed 05/05/20 (Hx 2 VBAC after pLTCS) 6. GHTN: Pre-E labs 7. Elevated glucose on CMPs: CBG ordered, never did 2 hour GTT, ?undiagnosed GDM?   Plan: Admit to L&D,  anticipate NSVD  Hailey L Sparacino, DO  05/29/2020, 11:46 AM

## 2020-05-29 NOTE — MAU Note (Signed)
Pt arrived via EMS complaining of contractions since this morning. Some bleeding. No LOF. Unsure about FM  [place] CNM to bedside to check pt, SVE 8.5cm with a bulging bag.  EFM applied at 1139 and FHT 140s.  Called Watkins in LD at 1140, report given, pt to room 216. Pt transported to LD via stretcher.

## 2020-05-29 NOTE — Progress Notes (Addendum)
Called by RN as patient is refusing all admission labs and pre-eclampsia labs. Patient is adamant that she does not want labs drawn at this time. "I do not want to be stuck at all". Explained that we want to make sure that she is not developing pre-eclampsia as well as make sure her hemoglobin is stable since she had a fast delivery and has a history of c/section. Patient again refuses; will ask patient again if patient's status changes and we need urgent labs.   Luna Kitchens

## 2020-05-30 LAB — CBC
HCT: 30.4 % — ABNORMAL LOW (ref 36.0–46.0)
Hemoglobin: 10.2 g/dL — ABNORMAL LOW (ref 12.0–15.0)
MCH: 32.8 pg (ref 26.0–34.0)
MCHC: 33.6 g/dL (ref 30.0–36.0)
MCV: 97.7 fL (ref 80.0–100.0)
Platelets: 241 10*3/uL (ref 150–400)
RBC: 3.11 MIL/uL — ABNORMAL LOW (ref 3.87–5.11)
RDW: 14.3 % (ref 11.5–15.5)
WBC: 15.6 10*3/uL — ABNORMAL HIGH (ref 4.0–10.5)
nRBC: 0 % (ref 0.0–0.2)

## 2020-05-30 LAB — RPR: RPR Ser Ql: NONREACTIVE

## 2020-05-30 MED ORDER — PNEUMOCOCCAL 13-VAL CONJ VACC IM SUSP
0.5000 mL | INTRAMUSCULAR | Status: DC
  Filled 2020-05-30: qty 0.5

## 2020-05-30 NOTE — Progress Notes (Addendum)
3:39pm-CSW spoke with [name] to give update on CPS. [name] reported to this CSW that her address is [address], [place], [place] ([facility]). CSw asked [name] about address in chart as [name] reported that this was the correct address when CSW first met with her and [name] reports that the address in her chart is her dad's address. CSW understanding of  this and updated assigned worker of address. CSW was then advised by [name] that she is in the shelter due to the pandemic. 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 [name] reported feelings of fear as it related to CPS. CSW offered [name] support in which [name] appeared to cal.   CSW offered [name] housing resources in which [name] is agreeable to at this time.   3:27pm-CPS to arrive to speak with [name] on tomorrow morning 05/31/20.   3:10pm-CSW followed up with [place] CPS to determine the status of case at this time. CSW was advised that case was assigned to T. Rogers.CSW has reached out to T. Rogers at this time.     Jamesyn Moorefield S. Dean Wonder, MSW, LCSW [facility] [place] [phone]  
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
## 2020-05-30 NOTE — Progress Notes (Signed)
Post Partum Day 1 Subjective: Patient reports feeling well. She is tolerating PO. Ambulating and urinating without difficulty. Lochia minimal.  Objective: Blood pressure 110/63, pulse 81, temperature 98.3 F (36.8 C), temperature source Oral, resp. rate 18, SpO2 100 %, unknown if currently breastfeeding.  Physical Exam:  General: alert, cooperative and no distress Lochia: appropriate Uterine Fundus: firm Incision: NA DVT Evaluation: No evidence of DVT seen on physical exam.  Recent Labs    05/30/20 0414  HGB 10.2*  HCT 30.4*    Assessment/Plan: Plan for discharge tomorrow  GHTN: BP's normal range currently; cont to monitor Vitals stable SW consult pending (homeless, hx bipolar/schizophrenia, UDS pos for cocaine) Bottle feeding Refusing blood draws Trich treated 5/26; needs TOC outpatient   LOS: 1 day   Joselyn Arrow 05/30/2020, 7:38 AM

## 2020-05-31 LAB — SURGICAL PATHOLOGY

## 2020-05-31 MED ORDER — IBUPROFEN 600 MG PO TABS: 600.0000 mg | ORAL_TABLET | Freq: Four times a day (QID) | ORAL | 0 refills | Status: DC

## 2020-05-31 MED ORDER — ACETAMINOPHEN 325 MG PO TABS: 650.0000 mg | ORAL_TABLET | ORAL | 1 refills | Status: DC | PRN

## 2020-05-31 NOTE — Progress Notes (Signed)
Baby boy evaluated.  At best would be 1.1 gomco.  Infant reevaluated in nursery and decision made for interval circumcision likely by pediatrician due to penis borderline too small for gomco procedure.  Findings and plan discussed with parents with agreement. No issues.

## 2020-05-31 NOTE — Progress Notes (Addendum)
8:53am-CSW escorted CPS worker T. Roger to [name]'s room. CSW asked that T. Rogers call CSW with plan once he has finished speaking with [name].    CSW has also provided [name] with housing resources in the [place] area as well as updated CPS worker of homeless concerns.    Rashawna Scoles S. Adelina Collard, MSW, LCSW [facility] [place] [phone]    

## 2020-05-31 NOTE — Progress Notes (Addendum)
11:28am-CSW made aware that [name] needed help with getting WIC set up. CSW hasassited [name] in this and appointment for June 07, 2020 at 2:30pm.   CSW was updated by T. Rogers that there are no further barriers to [name] and infant discharging. Per T. Roger with [place] CPS a safety plan has been completed and he is planning to follow up with [name] and infant once at [facility] for further services.   At this time [name] and infant are cleared from CPS standpoint, no barriers to discharge.      [name], MSW, LCSW [facility] [place] [phone]   

## 2020-06-03 ENCOUNTER — Encounter (HOSPITAL_COMMUNITY): Payer: Self-pay

## 2020-06-03 ENCOUNTER — Other Ambulatory Visit: Payer: Self-pay

## 2020-06-03 ENCOUNTER — Ambulatory Visit (HOSPITAL_COMMUNITY)
Admission: EM | Admit: 2020-06-03 | Discharge: 2020-06-03 | Disposition: A | Discharge status: Home / Self Care | Payer: Medicaid Other | Attending: Physician Assistant | Admitting: Physician Assistant

## 2020-06-03 DIAGNOSIS — K643 Fourth degree hemorrhoids: Secondary | ICD-10-CM

## 2020-06-03 MED ORDER — LIDOCAINE 5 % EX OINT
1.0000 | TOPICAL_OINTMENT | CUTANEOUS | 0 refills | Status: DC | PRN
Start: 2020-06-03 — End: 2021-03-20

## 2020-06-03 MED ORDER — HYDROCORTISONE 1 % EX OINT
1.0000 | TOPICAL_OINTMENT | Freq: Two times a day (BID) | CUTANEOUS | 0 refills | Status: DC
Start: 2020-06-03 — End: 2021-03-20

## 2020-06-03 MED ORDER — POLYETHYLENE GLYCOL 3350 17 GM/SCOOP PO POWD: 17.0000 g | Freq: Every day | ORAL | 0 refills | Status: DC

## 2020-06-03 NOTE — Discharge Instructions (Signed)
Apply a mixture of the hydrocortisone and lidocaine 2-3 times a day  Take 3 scoops of miralax in 16oz of water, then consume 1-2 scoops daily until soft bowel movements  Call [facility] today for evaluation  You may take tylenol and ibuprofen over the counter for additional relief

## 2020-06-03 NOTE — ED Triage Notes (Signed)
Pt presents with hemorrhoids X 2 days; pt states she has a lot of pain & inflammation.

## 2020-06-03 NOTE — ED Provider Notes (Signed)
[facility]    CSN: [id number] Arrival date & time: 06/03/20  1004      History   Chief Complaint Chief Complaint  Patient presents with  . Hemorrhoids    HPI DHAMAR GREGORY is a 34 y.o. female.   Patient with recent delivery presents for rectal pain that she believes is related to hemorrhoids.  She reports she can feel the hemorrhoids.  She is also wondering if this could be related to her stitches from where she was torn during delivery.  Reports it hurts a lot to have a bowel movement and this is preventing her from having a bowel movement for the last few days.  She reports prior to that having hard stool.  She does report having mucus passing currently.  Denies blood in stool or paper.  She does report that it hurts to sit down.     Past Medical History:  Diagnosis Date  . Asthma   . Obesity     Patient Active Problem List   Diagnosis Date Noted  . Normal labor 06-09-20  . Neonatal death in prior pregnancy, currently pregnant Jun 09, 2020  . GBS (group B Streptococcus carrier), +RV culture, currently pregnant 05/23/2020  . Elevated liver function tests 05/14/2020  . Chronic hypertension in pregnancy 05/12/2020  . Supervision of other high risk pregnancy, antepartum 03/09/2020  . Trichomonal vaginitis during pregnancy in third trimester 02/08/2020  . Depression 02/08/2020  . Bipolar disorder ([place]) 02/08/2020  . Hx of cesarean section 02/08/2020  . Schizophrenia ([place]) 02/08/2020  . Hx successful VBAC (vaginal birth after cesarean), currently pregnant 02/08/2020  . Fetal echogenic intracardiac focus on prenatal ultrasound 02/08/2020  . Obesity, Class III, BMI 40-49.9 (morbid obesity) ([place]) 03/06/2017  . Tobacco use 03/06/2017    Past Surgical History:  Procedure Laterality Date  . BREAST BIOPSY Left 07/31/2013   Procedure: IRRIGATION AND DEBRIDMENT LEFT BREAST ABSCESS;  Surgeon: Madilyn Hook, DO;  Location: [facility];  Service: General;  Laterality:  Left;  IRRIGATION AND DEBRIDEMENT LEFT BREAST ABSCESS  . CESAREAN SECTION      OB History    Gravida  4   Para  4   Term  0   Preterm  4   AB      Living  3     SAB      TAB      Ectopic      Multiple  0   Live Births  4            Home Medications    Prior to Admission medications   Medication Sig Start Date End Date Taking? Authorizing Provider  acetaminophen (TYLENOL) 325 MG tablet Take 2 tablets (650 mg total) by mouth every 4 (four) hours as needed (for pain scale < 4). 05/31/20   Donnamae Jude, MD  albuterol (VENTOLIN HFA) 108 (90 Base) MCG/ACT inhaler Inhale 1-2 puffs into the lungs every 6 (six) hours as needed for wheezing or shortness of breath. 05/14/20   Gavin Pound, CNM  hydrocortisone 1 % ointment Apply 1 application topically 2 (two) times daily. 06/03/20   Demeisha Geraghty, Marguerita Beards, PA-C  ibuprofen (ADVIL) 600 MG tablet Take 1 tablet (600 mg total) by mouth every 6 (six) hours. 05/31/20   Donnamae Jude, MD  lidocaine (XYLOCAINE) 5 % ointment Apply 1 application topically as needed. 06/03/20   Neven Fina, Marguerita Beards, PA-C  pantoprazole (PROTONIX) 20 MG tablet Take 1 tablet (20 mg total) by mouth 2 (two)  times daily. 05/14/20   Gerrit Heck, CNM  polyethylene glycol powder (GLYCOLAX/MIRALAX) 17 GM/SCOOP powder Take 17 g by mouth daily. 06/03/20   Sheza Strickland, Veryl Speak, PA-C  Prenat-Fe Poly-Methfol-FA-DHA (VITAFOL ULTRA) 29-0.6-0.4-200 MG CAPS Take 1 capsule by mouth daily before breakfast. 04/21/20   Brock Bad, MD    Family History Family History  Problem Relation Age of Onset  . Diabetes Father     Social History Social History   Tobacco Use  . Smoking status: Former Smoker    Packs/day: 1.00    Types: Cigarettes  . Smokeless tobacco: Never Used  . Tobacco comment: rare   Vaping Use  . Vaping Use: Never used  Substance Use Topics  . Alcohol use: No  . Drug use: Never    Comment: UDS positive results for cocaine & THC     Allergies   Patient has no known  allergies.   Review of Systems Review of Systems   Physical Exam Triage Vital Signs ED Triage Vitals  Enc Vitals Group     BP 06/03/20 1137 (!) 143/98     Pulse Rate 06/03/20 1137 84     Resp 06/03/20 1137 20     Temp 06/03/20 1137 98.8 F (37.1 C)     Temp Source 06/03/20 1137 Oral     SpO2 06/03/20 1137 98 %     Weight --      Height --      Head Circumference --      Peak Flow --      Pain Score 06/03/20 1139 10     Pain Loc --      Pain Edu? --      Excl. in GC? --    No data found.  Updated Vital Signs BP (!) 143/98 (BP Location: Right Arm)   Pulse 84   Temp 98.8 F (37.1 C) (Oral)   Resp 20   LMP  (LMP Unknown) Comment: LMP October 2020  SpO2 98%   Visual Acuity Right Eye Distance:   Left Eye Distance:   Bilateral Distance:    Right Eye Near:   Left Eye Near:    Bilateral Near:     Physical Exam Vitals and nursing note reviewed. Exam conducted with a chaperone present.  Constitutional:      Appearance: Normal appearance.  Genitourinary:    Comments: There are several what appear to be fourth degree prolapsed hemorrhoids or severe external hemorrhoids present.  1 does appear to be hard possible thrombosis.  All are tender to palpation.  Internal exam is deferred given the amount of pain.  Perineum appears intact with some sutures visible.  There is no bleeding. Neurological:     Mental Status: She is alert.      UC Treatments / Results  Labs (all labs ordered are listed, but only abnormal results are displayed) Labs Reviewed - No data to display  EKG   Radiology No results found.  Procedures Procedures (including critical care time)  Medications Ordered in UC Medications - No data to display  Initial Impression / Assessment and Plan / UC Course  I have reviewed the triage vital signs and the nursing notes.  Pertinent labs & imaging results that were available during my care of the patient were reviewed by me and considered in my  medical decision making (see chart for details).     #Hemorrhoids Patient is a 34 year old who is now recently postpartum presenting with severe case of hemorrhoids.  Given number of hemorrhoids feel she should have evaluation by general surgery.  Will place her on hydrocortisone/lidocaine topical mixture.  Also start her on MiraLAX to soften stool.  Discussed that if she is not able to have a bowel movement that she needs to seek evaluation in the emergency department.  Tylenol and ibuprofen for pain if she has not breast-feeding.  [facility] service number given and instructed patient to call today to set up an appointment for evaluation.  Patient verbalized understanding plan. Final Clinical Impressions(s) / UC Diagnoses   Final diagnoses:  Grade IV hemorrhoids     Discharge Instructions     Apply a mixture of the hydrocortisone and lidocaine 2-3 times a day  Take 3 scoops of miralax in 16oz of water, then consume 1-2 scoops daily until soft bowel movements  Call [facility] today for evaluation  You may take tylenol and ibuprofen over the counter for additional relief      ED Prescriptions    Medication Sig Dispense Auth. Provider   hydrocortisone 1 % ointment Apply 1 application topically 2 (two) times daily. 30 g Renso Swett, Veryl Speak, PA-C   lidocaine (XYLOCAINE) 5 % ointment Apply 1 application topically as needed. 35.44 g Mehul Rudin, Veryl Speak, PA-C   polyethylene glycol powder (GLYCOLAX/MIRALAX) 17 GM/SCOOP powder Take 17 g by mouth daily. 507 g Reynald Woods, Veryl Speak, PA-C     PDMP not reviewed this encounter.   Hermelinda Medicus, PA-C 06/04/20 0040

## 2020-06-29 ENCOUNTER — Ambulatory Visit: Payer: Medicaid Other | Admitting: Obstetrics and Gynecology

## 2020-07-15 ENCOUNTER — Other Ambulatory Visit: Payer: Self-pay

## 2020-07-15 ENCOUNTER — Emergency Department (HOSPITAL_COMMUNITY)
Admission: EM | Admit: 2020-07-15 | Discharge: 2020-07-15 | Disposition: A | Discharge status: Home / Self Care | Payer: Medicaid Other | Attending: Emergency Medicine | Admitting: Emergency Medicine

## 2020-07-15 ENCOUNTER — Encounter (HOSPITAL_COMMUNITY): Payer: Self-pay | Admitting: Emergency Medicine

## 2020-07-15 DIAGNOSIS — R000 Tachycardia, unspecified: Secondary | ICD-10-CM | POA: Insufficient documentation

## 2020-07-15 DIAGNOSIS — Y929 Unspecified place or not applicable: Secondary | ICD-10-CM | POA: Diagnosis not present

## 2020-07-15 DIAGNOSIS — Y999 Unspecified external cause status: Secondary | ICD-10-CM | POA: Diagnosis not present

## 2020-07-15 DIAGNOSIS — S0181XA Laceration without foreign body of other part of head, initial encounter: Secondary | ICD-10-CM

## 2020-07-15 DIAGNOSIS — J45909 Unspecified asthma, uncomplicated: Secondary | ICD-10-CM | POA: Insufficient documentation

## 2020-07-15 DIAGNOSIS — Z87891 Personal history of nicotine dependence: Secondary | ICD-10-CM | POA: Insufficient documentation

## 2020-07-15 DIAGNOSIS — Z79899 Other long term (current) drug therapy: Secondary | ICD-10-CM | POA: Diagnosis not present

## 2020-07-15 DIAGNOSIS — Y939 Activity, unspecified: Secondary | ICD-10-CM | POA: Diagnosis not present

## 2020-07-15 MED ORDER — IBUPROFEN 400 MG PO TABS
600.0000 mg | ORAL_TABLET | Freq: Once | ORAL | Status: AC
  Administered 2020-07-15: 600 mg via ORAL
  Filled 2020-07-15: qty 1

## 2020-07-15 MED ORDER — ACETAMINOPHEN 500 MG PO TABS
500.0000 mg | ORAL_TABLET | Freq: Once | ORAL | Status: AC
  Administered 2020-07-15: 500 mg via ORAL
  Filled 2020-07-15: qty 1

## 2020-07-15 MED ORDER — LIDOCAINE HCL (PF) 1 % IJ SOLN
20.0000 mL | Freq: Once | INTRAMUSCULAR | Status: DC
  Filled 2020-07-15: qty 20
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
## 2020-07-15 NOTE — ED Notes (Signed)
SW called this RN that they were coming to give pt money for gas so she can get to the shelter. RN informed pt she could wait in room w/ kids until she got there. RN saw pt walk out, asked pt if SW had seen her, pt stated no she was going to get milk for her baby and would be right back. Pt never returned.

## 2020-07-15 NOTE — Social Work (Signed)
TOC CSW was called back by Fleet Contras at [employer] [phone]. [facility] does have beds at their shelter and will accept pt and her family.  She is to meet [place] with [facility] [phone] at [facility].  TOC has arranged for supplies for the family to travel to [place], [place] as they were in need of assistance.    Please reconsult if new social work issues arise, CSW signing off.   Sudeep Scheibel Tarpley-Carter, MSW, LCSW-A                  [facility] ED Transitions of CareClinical Social Worker [email or web address] [phone]

## 2020-07-15 NOTE — Social Work (Signed)
TOC CSW Consult request has been received remotely. CSW attempting to follow up at present time.   CSW will continue to follow for dc needs.  Toan Mort Tarpley-Carter, MSW, LCSW-A                  [facility] ED Transitions of CareClinical Social Worker [email or web address] [phone]

## 2020-07-15 NOTE — Discharge Instructions (Signed)
Please get a follow-up appointment with primary doctor next week for recheck.  Swelling, purulence from the ulceration site.  Please follow-up with shelter as discussed.
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
## 2020-07-15 NOTE — ED Provider Notes (Signed)
LACERATION REPAIR Performed by: Linwood Dibbles Authorized by: Linwood Dibbles Consent: Verbal consent obtained. Risks and benefits: risks, benefits and alternatives were discussed Consent given by: patient Patient identity confirmed: provided demographic data Prepped and Draped in normal sterile fashion Wound explored  Laceration Location: left chin  Laceration Length:  1cm  No Foreign Bodies seen or palpated  Anesthesia: local infiltration  Local anesthetic: None  Anesthetic total: 0 ml  Irrigation method: syringe Amount of cleaning: standard  Skin closure: dermabond with steri strip  Number of sutures: None  Technique:   Patient tolerance: Patient tolerated the procedure well with no immediate complications.  Patient refused sutures. Discussed risk vs benefit. Voiced understanding. Elected to proceed with dermabond and steristrip. Closed with steri strip and dermabond with good approximation.   Ebert Forrester A, PA-C 07/15/20 [id number]    Milagros Loll, MD 07/15/20 1040
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
## 2020-12-17 NOTE — L&D Delivery Note (Signed)
CNM met EMS at bedside after vaginal delivery at home. See H&P for details of delivery.  Delivery Note At 10:00 AM a viable female was delivered via Vaginal, Spontaneous (Presentation: unknown).  APGAR: unknown; weight 1940g.   Placenta status:  spontaneous, disposed of by EMS.  Cord: Unknown with the following complications: unknown.  Anesthesia:  none Episiotomy: None Lacerations: None Suture Repair:  n/a Est. Blood Loss (mL):  unknown at home, approximately 136m in MAU  [facility]  Baby [facility] NICU.  CWende MottCNM 06/24/2021, 11:25 AM

## 2021-03-20 ENCOUNTER — Other Ambulatory Visit: Payer: Self-pay

## 2021-03-20 ENCOUNTER — Encounter (HOSPITAL_COMMUNITY): Payer: Self-pay | Admitting: Family Medicine

## 2021-03-20 ENCOUNTER — Inpatient Hospital Stay (HOSPITAL_COMMUNITY)
Admission: AD | Admit: 2021-03-20 | Discharge: 2021-03-20 | Disposition: A | Discharge status: Home / Self Care | Payer: Medicaid Other | Attending: Family Medicine | Admitting: Family Medicine

## 2021-03-20 DIAGNOSIS — O26899 Other specified pregnancy related conditions, unspecified trimester: Secondary | ICD-10-CM

## 2021-03-20 DIAGNOSIS — Z3A28 28 weeks gestation of pregnancy: Secondary | ICD-10-CM | POA: Insufficient documentation

## 2021-03-20 DIAGNOSIS — Z3492 Encounter for supervision of normal pregnancy, unspecified, second trimester: Secondary | ICD-10-CM | POA: Diagnosis present

## 2021-03-20 DIAGNOSIS — O219 Vomiting of pregnancy, unspecified: Secondary | ICD-10-CM

## 2021-03-20 DIAGNOSIS — O99613 Diseases of the digestive system complicating pregnancy, third trimester: Secondary | ICD-10-CM | POA: Diagnosis not present

## 2021-03-20 DIAGNOSIS — Z3493 Encounter for supervision of normal pregnancy, unspecified, third trimester: Secondary | ICD-10-CM | POA: Diagnosis not present

## 2021-03-20 DIAGNOSIS — K529 Noninfective gastroenteritis and colitis, unspecified: Secondary | ICD-10-CM | POA: Diagnosis not present

## 2021-03-20 DIAGNOSIS — R197 Diarrhea, unspecified: Secondary | ICD-10-CM

## 2021-03-20 HISTORY — DX: Personal history of other complications of pregnancy, childbirth and the puerperium: Z87.59

## 2021-03-20 HISTORY — DX: Personal history of other diseases of the digestive system: Z87.19

## 2021-03-20 LAB — URINALYSIS, ROUTINE W REFLEX MICROSCOPIC
Bilirubin Urine: NEGATIVE
Glucose, UA: NEGATIVE mg/dL
Hgb urine dipstick: NEGATIVE
Ketones, ur: NEGATIVE mg/dL
Leukocytes,Ua: NEGATIVE
Nitrite: NEGATIVE
Protein, ur: NEGATIVE mg/dL
Specific Gravity, Urine: 1.005 (ref 1.005–1.030)
pH: 7 (ref 5.0–8.0)

## 2021-03-20 MED ORDER — LOPERAMIDE HCL 2 MG PO CAPS
4.0000 mg | ORAL_CAPSULE | Freq: Once | ORAL | Status: DC
  Filled 2021-03-20: qty 2

## 2021-03-20 MED ORDER — LOPERAMIDE HCL 2 MG PO CAPS
2.0000 mg | ORAL_CAPSULE | Freq: Once | ORAL | Status: AC
  Administered 2021-03-20: 2 mg via ORAL

## 2021-03-20 MED ORDER — LOPERAMIDE HCL 2 MG PO TABS
2.0000 mg | ORAL_TABLET | Freq: Four times a day (QID) | ORAL | 0 refills | Status: DC | PRN
Start: 2021-03-20 — End: 2022-05-21

## 2021-03-20 MED ORDER — ONDANSETRON 4 MG PO TBDP
8.0000 mg | ORAL_TABLET | Freq: Once | ORAL | Status: AC
  Administered 2021-03-20: 8 mg via ORAL
  Filled 2021-03-20: qty 2

## 2021-03-20 MED ORDER — ONDANSETRON 8 MG PO TBDP
8.0000 mg | ORAL_TABLET | Freq: Three times a day (TID) | ORAL | 0 refills | Status: DC | PRN
Start: 2021-03-20 — End: 2023-07-21

## 2021-03-20 NOTE — MAU Note (Signed)
Pt denies cramping or contractions. Denies vaginal bleeding or discharge.

## 2021-03-20 NOTE — MAU Note (Signed)
Bedside ultrasound per Dr.Pratt

## 2021-03-20 NOTE — Discharge Instructions (Signed)
Diarrhea, Adult Diarrhea is when you pass loose and watery poop (stool) often. Diarrhea can make you feel weak and cause you to lose water in your body (get dehydrated). Losing water in your body can cause you to:  Feel tired and thirsty.  Have a dry mouth.  Go pee (urinate) less often. Diarrhea often lasts 2-3 days. However, it can last longer if it is a sign of something more serious. It is important to treat your diarrhea as told by your doctor. Follow these instructions at home: Eating and drinking Follow these instructions as told by your doctor:  Take an ORS (oral rehydration solution). This is a drink that helps you replace fluids and minerals your body lost. It is sold at pharmacies and stores.  Drink plenty of fluids, such as: ? Water. ? Ice chips. ? Diluted fruit juice. ? Low-calorie sports drinks. ? Milk, if you want.  Avoid drinking fluids that have a lot of sugar or caffeine in them.  Eat bland, easy-to-digest foods in small amounts as you are able. These foods include: ? Bananas. ? Applesauce. ? Rice. ? Low-fat (lean) meats. ? Toast. ? Crackers.  Avoid alcohol.  Avoid spicy or fatty foods.      Medicines  Take over-the-counter and prescription medicines only as told by your doctor.  If you were prescribed an antibiotic medicine, take it as told by your doctor. Do not stop using the antibiotic even if you start to feel better. General instructions  Wash your hands often using soap and water. If soap and water are not available, use a hand sanitizer. Others in your home should wash their hands as well. Hands should be washed: ? After using the toilet or changing a diaper. ? Before preparing, cooking, or serving food. ? While caring for a sick person. ? While visiting someone in a hospital.  Drink enough fluid to keep your pee (urine) pale yellow.  Rest at home while you get better.  Watch your condition for any changes.  Take a warm bath to help  with any burning or pain from having diarrhea.  Keep all follow-up visits as told by your doctor. This is important.   Contact a doctor if:  You have a fever.  Your diarrhea gets worse.  You have new symptoms.  You cannot keep fluids down.  You feel light-headed or dizzy.  You have a headache.  You have muscle cramps. Get help right away if:  You have chest pain.  You feel very weak or you pass out (faint).  You have bloody or black poop or poop that looks like tar.  You have very bad pain, cramping, or bloating in your belly (abdomen).  You have trouble breathing or you are breathing very quickly.  Your heart is beating very quickly.  Your skin feels cold and clammy.  You feel confused.  You have signs of losing too much water in your body, such as: ? Dark pee, very little pee, or no pee. ? Cracked lips. ? Dry mouth. ? Sunken eyes. ? Sleepiness. ? Weakness. Summary  Diarrhea is when you pass loose and watery poop (stool) often.  Diarrhea can make you feel weak and cause you to lose water in your body (get dehydrated).  Take an ORS (oral rehydration solution). This is a drink that is sold at pharmacies and stores.  Eat bland, easy-to-digest foods in small amounts as you are able.  Contact a doctor if your condition gets worse. Get help   right away if you have signs that you have lost too much water in your body. This information is not intended to replace advice given to you by your health care provider. Make sure you discuss any questions you have with your health care provider. Document Revised: 05/09/2018 Document Reviewed: 05/09/2018 Elsevier Patient Education  2021 Elsevier Inc.   Second Trimester of Pregnancy  The second trimester of pregnancy is from week 13 through week 27. This is also called months 4 through 6 of pregnancy. This is often the time when you feel your best. During the second trimester:  Morning sickness is less or has  stopped.  You may have more energy.  You may feel hungry more often. At this time, your unborn baby (fetus) is growing very fast. At the end of the sixth month, the unborn baby may be up to 12 inches long and weigh about 1 pounds. You will likely start to feel the baby move between 16 and 20 weeks of pregnancy. Body changes during your second trimester Your body continues to go through many changes during this time. The changes vary and generally return to normal after the baby is born. Physical changes  You will gain more weight.  You may start to get stretch marks on your hips, belly (abdomen), and breasts.  Your breasts will grow and may hurt.  Dark spots or blotches may develop on your face.  A dark line from your belly button to the pubic area (linea nigra) may appear.  You may have changes in your hair. Health changes  You may have headaches.  You may have heartburn.  You may have trouble pooping (constipation).  You may have hemorrhoids or swollen, bulging veins (varicose veins).  Your gums may bleed.  You may pee (urinate) more often.  You may have back pain. Follow these instructions at home: Medicines  Take over-the-counter and prescription medicines only as told by your doctor. Some medicines are not safe during pregnancy.  Take a prenatal vitamin that contains at least 600 micrograms (mcg) of folic acid. Eating and drinking  Eat healthy meals that include: ? Fresh fruits and vegetables. ? Whole grains. ? Good sources of protein, such as meat, eggs, or tofu. ? Low-fat dairy products.  Avoid raw meat and unpasteurized juice, milk, and cheese.  You may need to take these actions to prevent or treat trouble pooping: ? Drink enough fluids to keep your pee (urine) pale yellow. ? Eat foods that are high in fiber. These include beans, whole grains, and fresh fruits and vegetables. ? Limit foods that are high in fat and sugar. These include fried or sweet  foods. Activity  Exercise only as told by your doctor. Most people can do their usual exercise during pregnancy. Try to exercise for 30 minutes at least 5 days a week.  Stop exercising if you have pain or cramps in your belly or lower back.  Do not exercise if it is too hot or too humid, or if you are in a place of great height (high altitude).  Avoid heavy lifting.  If you choose to, you may have sex unless your doctor tells you not to. Relieving pain and discomfort  Wear a good support bra if your breasts are sore.  Take warm water baths (sitz baths) to soothe pain or discomfort caused by hemorrhoids. Use hemorrhoid cream if your doctor approves.  Rest with your legs raised (elevated) if you have leg cramps or low back pain.  If you develop bulging veins in your legs: ? Wear support hose as told by your doctor. ? Raise your feet for 15 minutes, 3-4 times a day. ? Limit salt in your food. Safety  Wear your seat belt at all times when you are in a car.  Talk with your doctor if someone is hurting you or yelling at you a lot. Lifestyle  Do not use hot tubs, steam rooms, or saunas.  Do not douche. Do not use tampons or scented sanitary pads.  Avoid cat litter boxes and soil used by cats. These carry germs that can harm your baby and can cause a loss of your baby by miscarriage or stillbirth.  Do not use herbal medicines, illegal drugs, or medicines that are not approved by your doctor. Do not drink alcohol.  Do not smoke or use any products that contain nicotine or tobacco. If you need help quitting, ask your doctor. General instructions  Keep all follow-up visits. This is important.  Ask your doctor about local prenatal classes.  Ask your doctor about the right foods to eat or for help finding a counselor. Where to find more information  American Pregnancy Association: americanpregnancy.org  [employer]: www.acog.org  Office  on [employer] [facility]: [employer] Contact a doctor if:  You have a headache that does not go away when you take medicine.  You have changes in how you see, or you see spots in front of your eyes.  You have mild cramps, pressure, or pain in your lower belly.  You continue to feel like you may vomit (nauseous), you vomit, or you have watery poop (diarrhea).  You have bad-smelling fluid coming from your vagina.  You have pain when you pee or your pee smells bad.  You have very bad swelling of your face, hands, ankles, feet, or legs.  You have a fever. Get help right away if:  You are leaking fluid from your vagina.  You have spotting or bleeding from your vagina.  You have very bad belly cramping or pain.  You have trouble breathing.  You have chest pain.  You faint.  You have not felt your baby move for the time period told by your doctor.  You have new or increased pain, swelling, or redness in an arm or leg. Summary  The second trimester of pregnancy is from week 13 through week 27 (months 4 through 6).  Eat healthy meals.  Exercise as told by your doctor. Most people can do their usual exercise during pregnancy.  Do not use herbal medicines, illegal drugs, or medicines that are not approved by your doctor. Do not drink alcohol.  Call your doctor if you get sick or if you notice anything unusual about your pregnancy. This information is not intended to replace advice given to you by your health care provider. Make sure you discuss any questions you have with your health care provider. Document Revised: 05/11/2020 Document Reviewed: 03/17/2020 Elsevier Patient Education  2021 [employer].
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
## 2021-03-20 NOTE — MAU Note (Signed)
Pt states she is able to tolerate liquids but not solid foods. Unknown [facility] has not currently established care
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
## 2021-04-07 ENCOUNTER — Other Ambulatory Visit: Payer: Medicaid Other

## 2021-04-14 ENCOUNTER — Ambulatory Visit: Payer: Medicaid Other

## 2021-04-14 ENCOUNTER — Ambulatory Visit: Payer: Medicaid Other | Attending: Certified Nurse Midwife

## 2021-06-24 ENCOUNTER — Other Ambulatory Visit: Payer: Self-pay

## 2021-06-24 ENCOUNTER — Observation Stay (HOSPITAL_COMMUNITY)
Admission: AD | Admit: 2021-06-24 | Discharge: 2021-06-24 | Disposition: A | Discharge status: Home / Self Care | Payer: Medicaid Other | Attending: Obstetrics & Gynecology | Admitting: Obstetrics & Gynecology

## 2021-06-24 ENCOUNTER — Encounter (HOSPITAL_COMMUNITY): Payer: Self-pay | Admitting: Obstetrics & Gynecology

## 2021-06-24 DIAGNOSIS — J45909 Unspecified asthma, uncomplicated: Secondary | ICD-10-CM | POA: Insufficient documentation

## 2021-06-24 DIAGNOSIS — O99335 Smoking (tobacco) complicating the puerperium: Secondary | ICD-10-CM | POA: Insufficient documentation

## 2021-06-24 DIAGNOSIS — Z79899 Other long term (current) drug therapy: Secondary | ICD-10-CM | POA: Diagnosis not present

## 2021-06-24 DIAGNOSIS — F17210 Nicotine dependence, cigarettes, uncomplicated: Secondary | ICD-10-CM | POA: Diagnosis not present

## 2021-06-24 DIAGNOSIS — O1413 Severe pre-eclampsia, third trimester: Secondary | ICD-10-CM | POA: Diagnosis not present

## 2021-06-24 DIAGNOSIS — O9953 Diseases of the respiratory system complicating the puerperium: Secondary | ICD-10-CM | POA: Insufficient documentation

## 2021-06-24 DIAGNOSIS — Z3A35 35 weeks gestation of pregnancy: Secondary | ICD-10-CM | POA: Diagnosis not present

## 2021-06-24 MED ORDER — IBUPROFEN 800 MG PO TABS
800.0000 mg | ORAL_TABLET | Freq: Once | ORAL | Status: AC
  Administered 2021-06-24: 800 mg via ORAL
  Filled 2021-06-24: qty 1

## 2021-06-24 MED ORDER — LABETALOL HCL 5 MG/ML IV SOLN
INTRAVENOUS | Status: AC
  Administered 2021-06-24: 20 mg via INTRAVENOUS
  Filled 2021-06-24: qty 4

## 2021-06-24 MED ORDER — LABETALOL HCL 5 MG/ML IV SOLN: 20.0000 mg | INTRAVENOUS | Status: DC | PRN

## 2021-06-24 MED ORDER — MAGNESIUM SULFATE BOLUS VIA INFUSION
4.0000 g | Freq: Once | INTRAVENOUS | Status: DC
  Filled 2021-06-24: qty 1000

## 2021-06-24 MED ORDER — NICOTINE 21 MG/24HR TD PT24
21.0000 mg | MEDICATED_PATCH | Freq: Every day | TRANSDERMAL | Status: DC
  Administered 2021-06-24: 21 mg via TRANSDERMAL
  Filled 2021-06-24: qty 1

## 2021-06-24 MED ORDER — MAGNESIUM SULFATE 40 GM/1000ML IV SOLN: 2.0000 g/h | INTRAVENOUS | Status: DC

## 2021-06-24 MED ORDER — OXYCODONE-ACETAMINOPHEN 5-325 MG PO TABS
2.0000 | ORAL_TABLET | Freq: Once | ORAL | Status: AC
  Administered 2021-06-24: 2 via ORAL
  Filled 2021-06-24: qty 2

## 2021-06-24 MED ORDER — LACTATED RINGERS IV SOLN: INTRAVENOUS | Status: DC

## 2021-06-24 MED ORDER — LABETALOL HCL 5 MG/ML IV SOLN: 80.0000 mg | INTRAVENOUS | Status: DC | PRN

## 2021-06-24 MED ORDER — HYDRALAZINE HCL 20 MG/ML IJ SOLN: 10.0000 mg | INTRAMUSCULAR | Status: DC | PRN

## 2021-06-24 MED ORDER — LABETALOL HCL 5 MG/ML IV SOLN: 40.0000 mg | INTRAVENOUS | Status: DC | PRN

## 2021-06-24 NOTE — MAU Note (Signed)
Left a voicemail with Social Work.

## 2021-06-24 NOTE — MAU Note (Signed)
Patient arrived to MAU via EMS approximately one hour post delivery at home. Antony Odea, CNM, at bedside.

## 2021-06-24 NOTE — MAU Note (Signed)
Phlebotomy at bedside to retrieve labs. Patient states, "Y'all ain't taking no blood from me." Antony Odea, CNM, notified of patient's refusal.

## 2021-06-24 NOTE — H&P (Signed)
OBSTETRIC ADMISSION HISTORY AND PHYSICAL  [name] is a 35 y.o. female [phone] with IUP at approximately [redacted] weeks gestation presenting after a delivery at home. She states she started contracting yesterday and it got significantly worse this morning. She states she got in the tub and her water broke and the baby came in the water immediately after. She reports the cord broke before the baby came out. She reports she started patting the baby and EMS came soon after. She reports she passed the placenta when she got out of the tub and she had some bleeding after. She reports no blurry vision, headaches or peripheral edema, and RUQ pain.  She plans on bottle feeding. She request is unsure what she wants  for birth control. She received no prenatal care.  Per EMS report, they arrived to the home and the baby was in the floor in the bathroom and the patient was in another room. They reported the baby was dusky with poor tone and no movement. They report they gave rescue breaths and baby became more pink. Upon arrival, blow by oxygen was being administered to baby.   Patient was brought by separate EMS. She has severe range blood pressures on arrival but asymptomatic.    Dating: By unsure LMP --->  Estimated Date of Delivery: 06/12/21  Prenatal History/Complications: No prenatal care  Past Medical History: Past Medical History:  Diagnosis Date   Asthma    History of pre-eclampsia    Hx of gallstones    Obesity     Past Surgical History: Past Surgical History:  Procedure Laterality Date   BREAST BIOPSY Left 07/31/2013   Procedure: IRRIGATION AND DEBRIDMENT LEFT BREAST ABSCESS;  Surgeon: Lodema Pilot, DO;  Location: [facility];  Service: General;  Laterality: Left;  IRRIGATION AND DEBRIDEMENT LEFT BREAST ABSCESS   CESAREAN SECTION      Obstetrical History: OB History     Gravida  5   Para  5   Term  1   Preterm  4   AB      Living  4      SAB      IAB      Ectopic       Multiple  0   Live Births  5           Social History Social History   Socioeconomic History   Marital status: Single    Spouse name: Not on file   Number of children: Not on file   Years of education: Not on file   Highest education level: Not on file  Occupational History   Not on file  Tobacco Use   Smoking status: Every Day    Packs/day: 1.00    Pack years: 0.00    Types: Cigarettes   Smokeless tobacco: Never   Tobacco comments:    rare   Vaping Use   Vaping Use: Never used  Substance and Sexual Activity   Alcohol use: No   Drug use: Never    Comment: UDS positive results for cocaine & THC   Sexual activity: Yes    Birth control/protection: None  Other Topics Concern   Not on file  Social History Narrative   Not on file   Social Determinants of Health   Financial Resource Strain: Not on file  Food Insecurity: Not on file  Transportation Needs: Not on file  Physical Activity: Not on file  Stress: Not on file  Social Connections: Not on  file    Family History: Family History  Problem Relation Age of Onset   Diabetes Father     Allergies: No Known Allergies  Medications Prior to Admission  Medication Sig Dispense Refill Last Dose   albuterol (VENTOLIN HFA) 108 (90 Base) MCG/ACT inhaler Inhale 1-2 puffs into the lungs every 6 (six) hours as needed for wheezing or shortness of breath. 6.7 g 0    Cholecalciferol (VITAMIN D3) 75 MCG (3000 UT) TABS Take 1 tablet by mouth daily.      loperamide (IMODIUM A-D) 2 MG tablet Take 1 tablet (2 mg total) by mouth 4 (four) times daily as needed for diarrhea or loose stools. 30 tablet 0    ondansetron (ZOFRAN ODT) 8 MG disintegrating tablet Take 1 tablet (8 mg total) by mouth every 8 (eight) hours as needed for nausea or vomiting. 20 tablet 0    Prenat-Fe Poly-Methfol-FA-DHA (VITAFOL ULTRA) 29-0.6-0.4-200 MG CAPS Take 1 capsule by mouth daily before breakfast. 90 capsule 4      Review of Systems   All systems  reviewed and negative except as stated in HPI  Last menstrual period 09/05/2020, unknown if currently breastfeeding. General appearance: alert and no distress Lungs: clear to auscultation bilaterally Heart: regular rate and rhythm Abdomen: soft, non-tender; bowel sounds normal Pelvic: n/a Extremities: Homans sign is negative, no sign of DVT DTR's +2 Presentation: cephalic Fetal monitoring: n/a Uterine activity: n/a  Prenatal labs: ORDERED  ABO, Rh:   Antibody:   Rubella:   RPR:    HBsAg:    HIV:    GBS:    Prenatal Transfer Tool  Maternal Diabetes: unknown Genetic Screening: Declined Maternal Ultrasounds/Referrals: n/a Fetal Ultrasounds or other Referrals:  None Maternal Substance Abuse:  unknown Significant Maternal Medications:  None Significant Maternal Lab Results: None  No results found for this or any previous visit (from the past 24 hour(s)).  Patient Active Problem List   Diagnosis Date Noted   Normal labor 2020-06-13   Neonatal death in prior pregnancy, currently pregnant 06-13-2020   GBS (group B Streptococcus carrier), +RV culture, currently pregnant 05/23/2020   Elevated liver function tests 05/14/2020   Chronic hypertension in pregnancy 05/12/2020   Supervision of other high risk pregnancy, antepartum 03/09/2020   Trichomonal vaginitis during pregnancy in third trimester 02/08/2020   Depression 02/08/2020   Bipolar disorder (HCC) 02/08/2020   Hx of cesarean section 02/08/2020   Schizophrenia (HCC) 02/08/2020   Hx successful VBAC (vaginal birth after cesarean), currently pregnant 02/08/2020   Fetal echogenic intracardiac focus on prenatal ultrasound 02/08/2020   Obesity, Class III, BMI 40-49.9 (morbid obesity) (HCC) 03/06/2017   Tobacco use 03/06/2017    Assessment/Plan:  [name] is a 35 y.o. G5P1404 at approximately 35 weeks who presented after a home delivery  NICU at bedside for assessment of infant. Recommends admission to NICU due to  size and temperature. Patient able to see infant and talk with NICU team before transport.  Assessment of patient showed minimal bleeding, no lacerations. BP still severe range but asymptomatic. Patient refused labs but agreeable to IV antihypertensives. Declined magnesium. Risks of untreated preeclampsia reviewed with patient including stroke, seizures and death. Patient verbalized understanding and still declines.   Patient states she is leaving as soon as possible. She is waiting for a ride and is going to leave when they arrive. Strongly encouraged patient to stay for at least 24 hours and patient states she will not be doing that. She states she is  calling the father of the baby who she has a restraining order against to come take care of the baby. House coverage, social work and Dr. Debroah Loop notified of patient situation.   Rolm Bookbinder, CNM  06/24/2021, 11:21 AM

## 2021-06-24 NOTE — MAU Note (Signed)
Patient signed out AMA. Form signed and placed in file.

## 2021-06-24 NOTE — Clinical Social Work Maternal (Signed)
CLINICAL SOCIAL WORK MATERNAL/CHILD NOTE  Patient Details  Name: [name] MRN: [id number] Date of Birth: 07/09/1986  Date:  06/24/2021  Clinical Social Worker Initiating Note:  Dammon Makarewicz, MSW, LCSWA Date/Time: Initiated:  06/24/21/1605     Child's Name:  pending   Biological Parents:  Mother, Father (Nicholas S, 12/04/90, [phone].)   Need for Interpreter:  None   Reason for Referral:  Current Domestic Violence  , Parental Support of Premature Babies < 32 weeks/or Critically Ill babies   Address:  [address] [place] [address]    Phone number:  [phone] (home)     Additional phone number:   Household Members/Support Persons (HM/SP):    (3 sons)   HM/SP Name Relationship DOB or Age  HM/SP -1        HM/SP -2        HM/SP -3        HM/SP -4        HM/SP -5        HM/SP -6        HM/SP -7        HM/SP -8          Natural Supports (not living in the home):  Parent (father)   Professional Supports: None   Employment: Unemployed   Type of Work:     Education:  High school graduate   Homebound arranged:    Financial Resources:  Medicaid   Other Resources:  WIC, Food Stamps     Cultural/Religious Considerations Which May Impact Care:    Strengths:  Pediatrician chosen   Psychotropic Medications:         Pediatrician:    [place] area  Pediatrician List:   [place] ([address])  [place]    [place]    [place]    [place]    [place]      Pediatrician Fax Number:    Risk Factors/Current Problems:  Transportation  , Family/Relationship Issues  , Basic Needs  , Compliance with Treatment   ([name] refuse Syphilis and HIV blood work for self and infant.)   Cognitive State:  Able to Concentrate  , Alert  , Poor Insight  , Poor Judgement  , Linear Thinking     Mood/Affect:  Comfortable  , Relaxed  , Constricted  , Interested  , Calm      CSW Assessment: CSW met with [name] to complete consult for infant's NICU admission and restraining order against FOB. CSW observed [name] and RN at infant's bedside. CSW explained role and reason for consult. [name] was pleasant, polite, and engaged with CSW. When CSW asked [name] of her emotions regarding infant's NICU admission. [name] reported, "she is just ready to get infant home". [name] reported,  her father is supportive. [name] denied SI, HI, and active DV when CSW assessed for safety. [name] reported, in the past, she did have a restraining order against FOB. [name] reported, however, it was dismissed 2-3 months ago. [name] reported, she gives FOB permission to visit infant, even while she is present.   CSW provided education regarding the baby blues period vs. perinatal mood disorders, discussed treatment and gave resources for mental health follow up if concerns arise. CSW recommends self- evaluation during the postpartum time period using the New Mom Checklist from Postpartum Progress and encouraged [name] to contact a medical professional if symptoms are noted at any time.     [name] reported, she receive WIC, and food stamps. [name] reported, infant's pediatrician will be at [facility] and there may be transportation issues to follow up infant's care. CSW inform [name] of medicaid transportation, but [name] was already familiar with process. [name] reported, she does not have all essentials needed to care for infant. [name] reported, she is in need of a car seat, pack & play, infant's clothing, and rental assistance.   CSW provided clothing and rental assistance resources.    CSW provided education on sudden infant death syndrome (SIDS).  CSW will continue to provide resources and ongoing support during infant's NICU admission.   CSW Plan/Description:  Psychosocial Support and Ongoing Assessment of Needs, Sudden Infant Death Syndrome (SIDS) Education, Perinatal Mood and Anxiety Disorder (PMADs) Education, Other  Information/Referral to [employer] [place] 2021/08/31, 4:19 PM  Darcus Austin, MSW, LCSW-A Clinical Social Worker- Weekends [phone]

## 2021-06-24 NOTE — MAU Note (Signed)
Spoke with [facility] regarding patient leaving AMA and visiting infant in NICU. AC stated the patient could sign the AMA papers and be shown to the NICU. AC informed patients FOB is not allowed to be in the building at the same time as the patient, per the patients restraining order. Patient informed with information received.

## 2021-06-24 NOTE — Progress Notes (Signed)
[name] request meal vouchers.CSW provided [name] with two meal vouchers valid through Tuesday 7/12.   Xylina Rhoads, MSW, LCSW-A Clinical Social Worker- Weekends [phone]  

## 2021-06-24 NOTE — Progress Notes (Signed)
CSW acknowledges and attempted to complete consult for infant's NICU admission. CSW observed [name], FOB, and RN at infant's bedside. [name] request CSW to return in a hour.   Dolores Frame, MSW, LCSW-A Clinical Social Worker- Weekends [phone]

## 2021-07-03 ENCOUNTER — Ambulatory Visit: Payer: Medicaid Other

## 2021-07-18 NOTE — Discharge Summary (Signed)
Obstetric Discharge Summary Reason for Admission:  Vaginal delivery at home Prenatal Procedures: none Intrapartum Procedures: spontaneous vaginal delivery Postpartum Procedures: none Complications-Operative and Postpartum:  Delivered at home and left AMA same day   Delivery Note At 10:00 AM a viable female was delivered via Vaginal, Spontaneous (Presentation:    cephalic  ).  APGAR: , ; weight 4 lb 4.8 oz (1950 g).  Placenta status:  ,  .  Cord: Unknown with the following complications:  . [name] is a 36 y.o. female [phone] with IUP at approximately [redacted] weeks gestation presenting after a delivery at home. She states she started contracting yesterday and it got significantly worse this morning. She states she got in the tub and her water broke and the baby came in the water immediately after. She reports the cord broke before the baby came out. She reports she started patting the baby and EMS came soon after. She reports she passed the placenta when she got out of the tub and she had some bleeding after. She reports no blurry vision, headaches or peripheral edema, and RUQ pain.  She plans on bottle feeding. She request is unsure what she wants  for birth control. She received no prenatal care. Anesthesia:   Episiotomy: None Lacerations: None Suture Repair:  none Est. Blood Loss (mL):  est 100 ml  Mom to postpartum.  Baby to NICU.  Scheryl Darter 07/18/2021, 10:47 AM     H/H: Lab Results  Component Value Date/Time   HGB 10.2 (L) 05/30/2020 04:14 AM   HGB 11.4 05/19/2020 10:41 AM   HCT 30.4 (L) 05/30/2020 04:14 AM   HCT 32.5 (L) 05/19/2020 10:41 AM      Discharge Diagnoses:  preterm delivery at home  Discharge Information: Date: 06/28/2011 Activity: pelvic rest Diet: routine Medications: None Breast feeding:  No:  Condition: stable Instructions: refer to practice specific booklet Discharge to: home   Scheryl Darter 07/18/2021,10:47 AM

## 2021-07-24 ENCOUNTER — Ambulatory Visit: Payer: Medicaid Other | Admitting: Obstetrics & Gynecology

## 2021-08-30 ENCOUNTER — Ambulatory Visit (HOSPITAL_COMMUNITY): Payer: Medicaid Other | Admitting: Licensed Clinical Social Worker

## 2022-05-21 ENCOUNTER — Encounter (HOSPITAL_COMMUNITY): Payer: Self-pay

## 2022-05-21 ENCOUNTER — Inpatient Hospital Stay (HOSPITAL_COMMUNITY)
Admission: AD | Admit: 2022-05-21 | Discharge: 2022-05-21 | Disposition: A | Discharge status: Home / Self Care | Payer: Medicaid Other | Attending: Obstetrics and Gynecology | Admitting: Obstetrics and Gynecology

## 2022-05-21 DIAGNOSIS — O99283 Endocrine, nutritional and metabolic diseases complicating pregnancy, third trimester: Secondary | ICD-10-CM | POA: Diagnosis not present

## 2022-05-21 DIAGNOSIS — O4703 False labor before 37 completed weeks of gestation, third trimester: Secondary | ICD-10-CM

## 2022-05-21 DIAGNOSIS — O09523 Supervision of elderly multigravida, third trimester: Secondary | ICD-10-CM | POA: Insufficient documentation

## 2022-05-21 DIAGNOSIS — Z3A33 33 weeks gestation of pregnancy: Secondary | ICD-10-CM | POA: Diagnosis not present

## 2022-05-21 DIAGNOSIS — O09293 Supervision of pregnancy with other poor reproductive or obstetric history, third trimester: Secondary | ICD-10-CM | POA: Insufficient documentation

## 2022-05-21 DIAGNOSIS — E876 Hypokalemia: Secondary | ICD-10-CM | POA: Insufficient documentation

## 2022-05-21 LAB — CBC
HCT: 30.6 % — ABNORMAL LOW (ref 36.0–46.0)
Hemoglobin: 10.4 g/dL — ABNORMAL LOW (ref 12.0–15.0)
MCH: 32.5 pg (ref 26.0–34.0)
MCHC: 34 g/dL (ref 30.0–36.0)
MCV: 95.6 fL (ref 80.0–100.0)
Platelets: 376 10*3/uL (ref 150–400)
RBC: 3.2 MIL/uL — ABNORMAL LOW (ref 3.87–5.11)
RDW: 13.3 % (ref 11.5–15.5)
WBC: 16.6 10*3/uL — ABNORMAL HIGH (ref 4.0–10.5)
nRBC: 0.1 % (ref 0.0–0.2)

## 2022-05-21 LAB — URINALYSIS, ROUTINE W REFLEX MICROSCOPIC
Bilirubin Urine: NEGATIVE
Glucose, UA: NEGATIVE mg/dL
Hgb urine dipstick: NEGATIVE
Ketones, ur: NEGATIVE mg/dL
Leukocytes,Ua: NEGATIVE
Nitrite: NEGATIVE
Protein, ur: NEGATIVE mg/dL
Specific Gravity, Urine: 1.001 — ABNORMAL LOW (ref 1.005–1.030)
pH: 7 (ref 5.0–8.0)

## 2022-05-21 LAB — COMPREHENSIVE METABOLIC PANEL
ALT: 12 U/L (ref 0–44)
AST: 18 U/L (ref 15–41)
Albumin: 2.6 g/dL — ABNORMAL LOW (ref 3.5–5.0)
Alkaline Phosphatase: 172 U/L — ABNORMAL HIGH (ref 38–126)
Anion gap: 10 (ref 5–15)
BUN: <5 mg/dL — ABNORMAL LOW (ref 6–20)
CO2: 19 mmol/L — ABNORMAL LOW (ref 22–32)
Calcium: 8.8 mg/dL — ABNORMAL LOW (ref 8.9–10.3)
Chloride: 105 mmol/L (ref 98–111)
Creatinine, Ser: 0.59 mg/dL (ref 0.44–1.00)
GFR, Estimated: >60 mL/min (ref >60)
Glucose, Bld: 101 mg/dL — ABNORMAL HIGH (ref 70–99)
Potassium: 2.9 mmol/L — ABNORMAL LOW (ref 3.5–5.1)
Sodium: 134 mmol/L — ABNORMAL LOW (ref 135–145)
Total Bilirubin: 0.6 mg/dL (ref 0.3–1.2)
Total Protein: 6.8 g/dL (ref 6.5–8.1)

## 2022-05-21 LAB — PROTEIN / CREATININE RATIO, URINE
Creatinine, Urine: 17.91 mg/dL
Total Protein, Urine: <6 mg/dL

## 2022-05-21 MED ORDER — FLUTICASONE PROPIONATE 50 MCG/ACT NA SUSP
1.0000 | Freq: Every day | NASAL | Status: DC
  Filled 2022-05-21: qty 16

## 2022-05-21 MED ORDER — LACTATED RINGERS IV BOLUS
1000.0000 mL | Freq: Once | INTRAVENOUS | Status: AC
  Administered 2022-05-21: 1000 mL via INTRAVENOUS

## 2022-05-21 MED ORDER — MENTHOL 3 MG MT LOZG
1.0000 | LOZENGE | OROMUCOSAL | Status: DC | PRN
  Filled 2022-05-21: qty 9

## 2022-05-21 MED ORDER — NIFEDIPINE 10 MG PO CAPS
10.0000 mg | ORAL_CAPSULE | ORAL | Status: DC | PRN
  Administered 2022-05-21 (×2): 10 mg via ORAL
  Filled 2022-05-21 (×2): qty 1

## 2022-05-21 MED ORDER — POTASSIUM CHLORIDE CRYS ER 20 MEQ PO TBCR: 20.0000 meq | EXTENDED_RELEASE_TABLET | Freq: Two times a day (BID) | ORAL | 0 refills | Status: DC

## 2022-05-21 NOTE — Discharge Instructions (Signed)

## 2022-05-21 NOTE — MAU Provider Note (Signed)
History     CSN: [id number]  Arrival date and time: 05/21/22 0148   Event Date/Time   First Provider Initiated Contact with Patient 05/21/22 [phone]      Chief Complaint  Patient presents with   Contractions   HPI  [name] is a 36 y.o. [id number] at [redacted]w[redacted]d who presents via EMS for evaluation of contractions. Patient reports she started contraction 6/3 and they have continued. She reports they are every couple of minutes and nothing helps them. Patient rates the pain as a 6/10 and has not tried anything for the pain. She denies any vaginal bleeding, discharge, and leaking of fluid. Denies any constipation, diarrhea or any urinary complaints. Reports normal fetal movement.  OB History     Gravida  6   Para  5   Term  2   Preterm  3   AB      Living  4      SAB      IAB      Ectopic      Multiple  0   Live Births  5           Past Medical History:  Diagnosis Date   Asthma    History of pre-eclampsia    Hx of gallstones    Obesity     Past Surgical History:  Procedure Laterality Date   BREAST BIOPSY Left 07/31/2013   Procedure: IRRIGATION AND DEBRIDMENT LEFT BREAST ABSCESS;  Surgeon: Lodema Pilot, DO;  Location: [facility];  Service: General;  Laterality: Left;  IRRIGATION AND DEBRIDEMENT LEFT BREAST ABSCESS   CESAREAN SECTION      Family History  Problem Relation Age of Onset   Diabetes Father     Social History   Tobacco Use   Smoking status: Some Days    Types: Cigarettes   Smokeless tobacco: Never   Tobacco comments:    rare   Vaping Use   Vaping Use: Never used  Substance Use Topics   Alcohol use: Not Currently   Drug use: Not Currently    Allergies: No Known Allergies  Medications Prior to Admission  Medication Sig Dispense Refill Last Dose   Prenatal Vit-Fe Fumarate-FA (MULTIVITAMIN-PRENATAL) 27-0.8 MG TABS tablet Take 1 tablet by mouth daily at 12 noon.   05/20/2022   albuterol (VENTOLIN HFA) 108 (90 Base) MCG/ACT inhaler Inhale  1-2 puffs into the lungs every 6 (six) hours as needed for wheezing or shortness of breath. 6.7 g 0 More than a month   Cholecalciferol (VITAMIN D3) 75 MCG (3000 UT) TABS Take 1 tablet by mouth daily.      loperamide (IMODIUM A-D) 2 MG tablet Take 1 tablet (2 mg total) by mouth 4 (four) times daily as needed for diarrhea or loose stools. 30 tablet 0    ondansetron (ZOFRAN ODT) 8 MG disintegrating tablet Take 1 tablet (8 mg total) by mouth every 8 (eight) hours as needed for nausea or vomiting. 20 tablet 0    Prenat-Fe Poly-Methfol-FA-DHA (VITAFOL ULTRA) 29-0.6-0.4-200 MG CAPS Take 1 capsule by mouth daily before breakfast. 90 capsule 4     Review of Systems  Constitutional: Negative.  Negative for fatigue and fever.  HENT: Negative.    Respiratory: Negative.  Negative for shortness of breath.   Cardiovascular: Negative.  Negative for chest pain.  Gastrointestinal:  Positive for abdominal pain. Negative for constipation, diarrhea, nausea and vomiting.  Genitourinary: Negative.  Negative for dysuria, vaginal bleeding and vaginal discharge.  Neurological:  Negative.  Negative for dizziness and headaches.  Physical Exam   Blood pressure 124/79, pulse (!) 110, temperature 98.8 F (37.1 C), temperature source Oral, resp. rate 18, SpO2 100 %, unknown if currently breastfeeding.  Patient Vitals for the past 24 hrs:  BP Temp Temp src Pulse Resp SpO2  05/21/22 0340 124/79 -- -- (!) 107 -- --  05/21/22 0338 124/79 -- -- -- -- --  05/21/22 0248 (!) 149/88 -- -- 99 -- --  05/21/22 0202 139/86 98.8 F (37.1 C) Oral (!) 110 18 100 %    Physical Exam Vitals and nursing note reviewed.  Constitutional:      General: She is not in acute distress.    Appearance: She is well-developed.  HENT:     Head: Normocephalic.  Eyes:     Pupils: Pupils are equal, round, and reactive to light.  Cardiovascular:     Rate and Rhythm: Normal rate and regular rhythm.     Heart sounds: Normal heart sounds.   Pulmonary:     Effort: Pulmonary effort is normal. No respiratory distress.     Breath sounds: Normal breath sounds.  Abdominal:     General: Bowel sounds are normal. There is no distension.     Palpations: Abdomen is soft.     Tenderness: There is no abdominal tenderness.  Skin:    General: Skin is warm and dry.  Neurological:     Mental Status: She is alert and oriented to person, place, and time.  Psychiatric:        Mood and Affect: Mood normal.        Behavior: Behavior normal.        Thought Content: Thought content normal.        Judgment: Judgment normal.   Fetal Tracing:  Baseline: 150 Variability: moderate Accels: 15x15 Decels: none  Toco: 5-15  Dilation: 3.5 Effacement (%): 50 Station: Ballotable Presentation: Undeterminable Exam by:: Ma Hillock. Armen Waring CNM  MAU Course  Procedures  Results for orders placed or performed during the hospital encounter of 05/21/22 (from the past 24 hour(s))  CBC     Status: Abnormal   Collection Time: 05/21/22  2:40 AM  Result Value Ref Range   WBC 16.6 (H) 4.0 - 10.5 K/uL   RBC 3.20 (L) 3.87 - 5.11 MIL/uL   Hemoglobin 10.4 (L) 12.0 - 15.0 g/dL   HCT 96.230.6 (L) 95.236.0 - 84.146.0 %   MCV 95.6 80.0 - 100.0 fL   MCH 32.5 26.0 - 34.0 pg   MCHC 34.0 30.0 - 36.0 g/dL   RDW 32.413.3 40.111.5 - 02.715.5 %   Platelets 376 150 - 400 K/uL   nRBC 0.1 0.0 - 0.2 %  Comprehensive metabolic panel     Status: Abnormal   Collection Time: 05/21/22  2:40 AM  Result Value Ref Range   Sodium 134 (L) 135 - 145 mmol/L   Potassium 2.9 (L) 3.5 - 5.1 mmol/L   Chloride 105 98 - 111 mmol/L   CO2 19 (L) 22 - 32 mmol/L   Glucose, Bld 101 (H) 70 - 99 mg/dL   BUN <5 (L) 6 - 20 mg/dL   Creatinine, Ser 2.530.59 0.44 - 1.00 mg/dL   Calcium 8.8 (L) 8.9 - 10.3 mg/dL   Total Protein 6.8 6.5 - 8.1 g/dL   Albumin 2.6 (L) 3.5 - 5.0 g/dL   AST 18 15 - 41 U/L   ALT 12 0 - 44 U/L   Alkaline Phosphatase 172 (H) 38 -  126 U/L   Total Bilirubin 0.6 0.3 - 1.2 mg/dL   GFR, Estimated >16 >10  mL/min   Anion gap 10 5 - 15  Urinalysis, Routine w reflex microscopic Urine, Clean Catch     Status: Abnormal   Collection Time: 05/21/22  3:34 AM  Result Value Ref Range   Color, Urine STRAW (A) YELLOW   APPearance CLEAR CLEAR   Specific Gravity, Urine 1.001 (L) 1.005 - 1.030   pH 7.0 5.0 - 8.0   Glucose, UA NEGATIVE NEGATIVE mg/dL   Hgb urine dipstick NEGATIVE NEGATIVE   Bilirubin Urine NEGATIVE NEGATIVE   Ketones, ur NEGATIVE NEGATIVE mg/dL   Protein, ur NEGATIVE NEGATIVE mg/dL   Nitrite NEGATIVE NEGATIVE   Leukocytes,Ua NEGATIVE NEGATIVE  Protein / creatinine ratio, urine     Status: None   Collection Time: 05/21/22  3:34 AM  Result Value Ref Range   Creatinine, Urine 17.91 mg/dL   Total Protein, Urine <6 mg/dL   Protein Creatinine Ratio        0.00 - 0.15 mg/mg[Cre]     MDM Prenatal records from community office not on file. Pregnancy complicated by limited care.  Labs ordered and reviewed.   UA CBC, CMP, Protein/creat ratio  LR bolus Procardia series  Patient reports procardia and fluids have helped the contractions and does not feel like she's going to have a baby. Cervix unchanged after 2 hours.   Assessment and Plan   1. Threatened preterm labor, third trimester   2. [redacted] weeks gestation of pregnancy   3. Hypokalemia     -Discharge home in stable condition -Rx for potassium sent to patient's pharmacy -Preterm labor precautions discussed -Patient advised to follow-up with OB as scheduled for prenatal care -Patient may return to MAU as needed or if her condition were to change or worsen  Rolm Bookbinder, CNM 05/21/2022, 4:25 AM

## 2022-05-21 NOTE — MAU Note (Signed)
..  [name] is a 36 y.o. at Unknown here in MAU via EMS reporting: prenatal care at health department in [facility]. Pt reports contractions since Saturday at 1am, now they are every 10-15  minutes. Denies vaginal bleeding or leaking of fluid. +FM  Due date given at HD: 07/07/2022 EMS reported that she had an elevated BP of 154/88. Denies headache, visual changes, or epigastric pain.  Pain score: 8/10 Vitals:   05/21/22 0202  BP: 139/86  Pulse: (!) 110  Resp: 18  Temp: 98.8 F (37.1 C)  SpO2: 100%     HTD:SKAJGOT in room 160s Lab orders placed from triage: UA

## 2022-06-09 ENCOUNTER — Other Ambulatory Visit: Payer: Self-pay

## 2022-06-09 ENCOUNTER — Inpatient Hospital Stay (HOSPITAL_COMMUNITY): Payer: Medicaid Other | Admitting: Anesthesiology

## 2022-06-09 ENCOUNTER — Inpatient Hospital Stay (HOSPITAL_COMMUNITY): Payer: Medicaid Other

## 2022-06-09 ENCOUNTER — Encounter (HOSPITAL_COMMUNITY)
Admission: AD | Disposition: A | Discharge status: Home / Self Care | Payer: Self-pay | Source: Home / Self Care | Attending: Obstetrics and Gynecology

## 2022-06-09 ENCOUNTER — Encounter (HOSPITAL_COMMUNITY): Payer: Self-pay | Admitting: Obstetrics and Gynecology

## 2022-06-09 ENCOUNTER — Inpatient Hospital Stay (HOSPITAL_COMMUNITY)
Admission: AD | Admit: 2022-06-09 | Discharge: 2022-06-12 | DRG: 787 | Disposition: A | Discharge status: Home / Self Care | Payer: Medicaid Other | Attending: Family Medicine | Admitting: Family Medicine

## 2022-06-09 DIAGNOSIS — Z98891 History of uterine scar from previous surgery: Principal | ICD-10-CM

## 2022-06-09 DIAGNOSIS — O26893 Other specified pregnancy related conditions, third trimester: Secondary | ICD-10-CM | POA: Diagnosis present

## 2022-06-09 DIAGNOSIS — O99334 Smoking (tobacco) complicating childbirth: Secondary | ICD-10-CM | POA: Diagnosis present

## 2022-06-09 DIAGNOSIS — O164 Unspecified maternal hypertension, complicating childbirth: Secondary | ICD-10-CM

## 2022-06-09 DIAGNOSIS — O321XX0 Maternal care for breech presentation, not applicable or unspecified: Secondary | ICD-10-CM | POA: Diagnosis present

## 2022-06-09 DIAGNOSIS — D62 Acute posthemorrhagic anemia: Secondary | ICD-10-CM | POA: Diagnosis not present

## 2022-06-09 DIAGNOSIS — O4593 Premature separation of placenta, unspecified, third trimester: Secondary | ICD-10-CM | POA: Diagnosis present

## 2022-06-09 DIAGNOSIS — O1002 Pre-existing essential hypertension complicating childbirth: Secondary | ICD-10-CM | POA: Diagnosis present

## 2022-06-09 DIAGNOSIS — O134 Gestational [pregnancy-induced] hypertension without significant proteinuria, complicating childbirth: Secondary | ICD-10-CM | POA: Diagnosis not present

## 2022-06-09 DIAGNOSIS — Z3A36 36 weeks gestation of pregnancy: Secondary | ICD-10-CM

## 2022-06-09 DIAGNOSIS — O99214 Obesity complicating childbirth: Secondary | ICD-10-CM | POA: Diagnosis present

## 2022-06-09 DIAGNOSIS — O329XX0 Maternal care for malpresentation of fetus, unspecified, not applicable or unspecified: Secondary | ICD-10-CM | POA: Diagnosis not present

## 2022-06-09 DIAGNOSIS — O9081 Anemia of the puerperium: Secondary | ICD-10-CM | POA: Diagnosis not present

## 2022-06-09 DIAGNOSIS — O34211 Maternal care for low transverse scar from previous cesarean delivery: Secondary | ICD-10-CM | POA: Diagnosis present

## 2022-06-09 DIAGNOSIS — R079 Chest pain, unspecified: Secondary | ICD-10-CM | POA: Diagnosis not present

## 2022-06-09 DIAGNOSIS — O34219 Maternal care for unspecified type scar from previous cesarean delivery: Secondary | ICD-10-CM | POA: Diagnosis not present

## 2022-06-09 DIAGNOSIS — O99893 Other specified diseases and conditions complicating puerperium: Secondary | ICD-10-CM | POA: Diagnosis not present

## 2022-06-09 DIAGNOSIS — Z23 Encounter for immunization: Secondary | ICD-10-CM | POA: Diagnosis not present

## 2022-06-09 DIAGNOSIS — F17210 Nicotine dependence, cigarettes, uncomplicated: Secondary | ICD-10-CM | POA: Diagnosis present

## 2022-06-09 DIAGNOSIS — O10919 Unspecified pre-existing hypertension complicating pregnancy, unspecified trimester: Secondary | ICD-10-CM | POA: Diagnosis present

## 2022-06-09 DIAGNOSIS — R059 Cough, unspecified: Secondary | ICD-10-CM | POA: Diagnosis present

## 2022-06-09 LAB — DIC (DISSEMINATED INTRAVASCULAR COAGULATION)PANEL
D-Dimer, Quant: 3.98 ug/mL-FEU — ABNORMAL HIGH (ref 0.00–0.50)
Fibrinogen: 467 mg/dL (ref 210–475)
INR: 1.1 (ref 0.8–1.2)
Platelets: 321 10*3/uL (ref 150–400)
Prothrombin Time: 13.8 seconds (ref 11.4–15.2)
Smear Review: NONE SEEN
aPTT: 24 seconds (ref 24–36)

## 2022-06-09 LAB — COMPREHENSIVE METABOLIC PANEL
ALT: 13 U/L (ref 0–44)
AST: 20 U/L (ref 15–41)
Albumin: 2.7 g/dL — ABNORMAL LOW (ref 3.5–5.0)
Alkaline Phosphatase: 197 U/L — ABNORMAL HIGH (ref 38–126)
Anion gap: 9 (ref 5–15)
BUN: <5 mg/dL — ABNORMAL LOW (ref 6–20)
CO2: 20 mmol/L — ABNORMAL LOW (ref 22–32)
Calcium: 9.1 mg/dL (ref 8.9–10.3)
Chloride: 104 mmol/L (ref 98–111)
Creatinine, Ser: 0.65 mg/dL (ref 0.44–1.00)
GFR, Estimated: >60 mL/min (ref >60)
Glucose, Bld: 93 mg/dL (ref 70–99)
Potassium: 3.7 mmol/L (ref 3.5–5.1)
Sodium: 133 mmol/L — ABNORMAL LOW (ref 135–145)
Total Bilirubin: 0.4 mg/dL (ref 0.3–1.2)
Total Protein: 6.9 g/dL (ref 6.5–8.1)

## 2022-06-09 LAB — RAPID URINE DRUG SCREEN, HOSP PERFORMED
Amphetamines: NOT DETECTED
Barbiturates: NOT DETECTED
Benzodiazepines: POSITIVE — AB
Cocaine: NOT DETECTED
Opiates: NOT DETECTED
Tetrahydrocannabinol: NOT DETECTED

## 2022-06-09 LAB — URINALYSIS, ROUTINE W REFLEX MICROSCOPIC
Bilirubin Urine: NEGATIVE
Glucose, UA: NEGATIVE mg/dL
Ketones, ur: NEGATIVE mg/dL
Leukocytes,Ua: NEGATIVE
Nitrite: NEGATIVE
Protein, ur: NEGATIVE mg/dL
RBC / HPF: >50 RBC/hpf — ABNORMAL HIGH (ref 0–5)
Specific Gravity, Urine: 1.014 (ref 1.005–1.030)
pH: 7 (ref 5.0–8.0)

## 2022-06-09 LAB — POCT I-STAT 7, (LYTES, BLD GAS, ICA,H+H)
Acid-base deficit: 5 mmol/L — ABNORMAL HIGH (ref 0.0–2.0)
Acid-base deficit: 7 mmol/L — ABNORMAL HIGH (ref 0.0–2.0)
Bicarbonate: 20.3 mmol/L (ref 20.0–28.0)
Bicarbonate: 18.1 mmol/L — ABNORMAL LOW (ref 20.0–28.0)
Calcium, Ion: 1.2 mmol/L (ref 1.15–1.40)
Calcium, Ion: 1.09 mmol/L — ABNORMAL LOW (ref 1.15–1.40)
HCT: 28 % — ABNORMAL LOW (ref 36.0–46.0)
HCT: 22 % — ABNORMAL LOW (ref 36.0–46.0)
Hemoglobin: 9.5 g/dL — ABNORMAL LOW (ref 12.0–15.0)
Hemoglobin: 7.5 g/dL — ABNORMAL LOW (ref 12.0–15.0)
O2 Saturation: 96 %
O2 Saturation: 96 %
Potassium: 3.6 mmol/L (ref 3.5–5.1)
Potassium: 3.5 mmol/L (ref 3.5–5.1)
Sodium: 133 mmol/L — ABNORMAL LOW (ref 135–145)
Sodium: 135 mmol/L (ref 135–145)
TCO2: 21 mmol/L — ABNORMAL LOW (ref 22–32)
TCO2: 19 mmol/L — ABNORMAL LOW (ref 22–32)
pCO2 arterial: 36.3 mmHg (ref 32–48)
pCO2 arterial: 33.5 mmHg (ref 32–48)
pH, Arterial: 7.355 (ref 7.35–7.45)
pH, Arterial: 7.341 — ABNORMAL LOW (ref 7.35–7.45)
pO2, Arterial: 82 mmHg — ABNORMAL LOW (ref 83–108)
pO2, Arterial: 89 mmHg (ref 83–108)

## 2022-06-09 LAB — CBC
HCT: 34.4 % — ABNORMAL LOW (ref 36.0–46.0)
Hemoglobin: 11.1 g/dL — ABNORMAL LOW (ref 12.0–15.0)
MCH: 30.9 pg (ref 26.0–34.0)
MCHC: 32.3 g/dL (ref 30.0–36.0)
MCV: 95.8 fL (ref 80.0–100.0)
Platelets: 392 10*3/uL (ref 150–400)
RBC: 3.59 MIL/uL — ABNORMAL LOW (ref 3.87–5.11)
RDW: 13.8 % (ref 11.5–15.5)
WBC: 13.4 10*3/uL — ABNORMAL HIGH (ref 4.0–10.5)
nRBC: 0 % (ref 0.0–0.2)

## 2022-06-09 LAB — PROTEIN / CREATININE RATIO, URINE
Creatinine, Urine: 122.99 mg/dL
Protein Creatinine Ratio: 0.23 mg/mg{Cre} — ABNORMAL HIGH (ref 0.00–0.15)
Total Protein, Urine: 28 mg/dL

## 2022-06-09 LAB — RAPID HIV SCREEN (HIV 1/2 AB+AG)
HIV 1/2 Antibodies: NONREACTIVE
HIV-1 P24 Antigen - HIV24: NONREACTIVE

## 2022-06-09 LAB — PREPARE RBC (CROSSMATCH)

## 2022-06-09 LAB — GROUP B STREP BY PCR: Group B strep by PCR: POSITIVE — AB

## 2022-06-09 LAB — HEPATITIS B SURFACE ANTIGEN: Hepatitis B Surface Ag: NONREACTIVE

## 2022-06-09 LAB — HEPATITIS C ANTIBODY: HCV Ab: NONREACTIVE

## 2022-06-09 SURGERY — Surgical Case
Anesthesia: Epidural

## 2022-06-09 MED ORDER — PRENATAL MULTIVITAMIN CH
1.0000 | ORAL_TABLET | Freq: Every day | ORAL | Status: DC
  Administered 2022-06-10 – 2022-06-12 (×3): 1 via ORAL
  Filled 2022-06-09 (×3): qty 1

## 2022-06-09 MED ORDER — LACTATED RINGERS IV SOLN: INTRAVENOUS | Status: DC | PRN

## 2022-06-09 MED ORDER — DIPHENHYDRAMINE HCL 25 MG PO CAPS
25.0000 mg | ORAL_CAPSULE | ORAL | Status: DC | PRN
  Filled 2022-06-09: qty 1

## 2022-06-09 MED ORDER — SODIUM CHLORIDE 0.9 % IV SOLN
INTRAVENOUS | Status: AC
  Filled 2022-06-09: qty 5

## 2022-06-09 MED ORDER — FENTANYL-BUPIVACAINE-NACL 0.5-0.125-0.9 MG/250ML-% EP SOLN
EPIDURAL | Status: DC | PRN
  Administered 2022-06-09: 12 mL/h via EPIDURAL

## 2022-06-09 MED ORDER — HYDROMORPHONE HCL 1 MG/ML IJ SOLN
INTRAMUSCULAR | Status: AC
  Filled 2022-06-09: qty 0.5

## 2022-06-09 MED ORDER — LIDOCAINE HCL (PF) 1 % IJ SOLN
INTRAMUSCULAR | Status: DC | PRN
  Administered 2022-06-09: 2 mL via EPIDURAL
  Administered 2022-06-09: 10 mL via EPIDURAL

## 2022-06-09 MED ORDER — DIBUCAINE (PERIANAL) 1 % EX OINT: 1.0000 | TOPICAL_OINTMENT | CUTANEOUS | Status: DC | PRN

## 2022-06-09 MED ORDER — PHENYLEPHRINE 80 MCG/ML (10ML) SYRINGE FOR IV PUSH (FOR BLOOD PRESSURE SUPPORT)
PREFILLED_SYRINGE | INTRAVENOUS | Status: AC
  Filled 2022-06-09: qty 10

## 2022-06-09 MED ORDER — HYDROMORPHONE HCL 1 MG/ML IJ SOLN
0.2500 mg | INTRAMUSCULAR | Status: DC | PRN
  Administered 2022-06-09: 0.5 mg via INTRAVENOUS

## 2022-06-09 MED ORDER — SENNOSIDES-DOCUSATE SODIUM 8.6-50 MG PO TABS
2.0000 | ORAL_TABLET | Freq: Every day | ORAL | Status: DC
  Administered 2022-06-10 – 2022-06-12 (×3): 2 via ORAL
  Filled 2022-06-09 (×3): qty 2

## 2022-06-09 MED ORDER — DEXMEDETOMIDINE (PRECEDEX) IN NS 20 MCG/5ML (4 MCG/ML) IV SYRINGE
PREFILLED_SYRINGE | INTRAVENOUS | Status: DC | PRN
  Administered 2022-06-09: 10 ug via INTRAVENOUS
  Administered 2022-06-09: 20 ug via INTRAVENOUS
  Administered 2022-06-09 (×2): 8 ug via INTRAVENOUS
  Administered 2022-06-09: 12 ug via INTRAVENOUS
  Administered 2022-06-09: 8 ug via INTRAVENOUS
  Administered 2022-06-09: 10 ug via INTRAVENOUS
  Administered 2022-06-09: 4 ug via INTRAVENOUS

## 2022-06-09 MED ORDER — ALBUMIN HUMAN 5 % IV SOLN: INTRAVENOUS | Status: DC | PRN

## 2022-06-09 MED ORDER — NALOXONE HCL 0.4 MG/ML IJ SOLN: 0.4000 mg | INTRAMUSCULAR | Status: DC | PRN

## 2022-06-09 MED ORDER — SOD CITRATE-CITRIC ACID 500-334 MG/5ML PO SOLN
30.0000 mL | ORAL | Status: DC | PRN
  Administered 2022-06-09: 30 mL via ORAL
  Filled 2022-06-09: qty 30

## 2022-06-09 MED ORDER — STERILE WATER FOR IRRIGATION IR SOLN
Status: DC | PRN
  Administered 2022-06-09: 1

## 2022-06-09 MED ORDER — PHENYLEPHRINE 80 MCG/ML (10ML) SYRINGE FOR IV PUSH (FOR BLOOD PRESSURE SUPPORT)
80.0000 ug | PREFILLED_SYRINGE | INTRAVENOUS | Status: AC | PRN
  Administered 2022-06-09 (×3): 80 ug via INTRAVENOUS
  Filled 2022-06-09: qty 10

## 2022-06-09 MED ORDER — IBUPROFEN 600 MG PO TABS
600.0000 mg | ORAL_TABLET | Freq: Four times a day (QID) | ORAL | Status: DC
  Administered 2022-06-10 – 2022-06-12 (×9): 600 mg via ORAL
  Filled 2022-06-09 (×9): qty 1

## 2022-06-09 MED ORDER — DIPHENHYDRAMINE HCL 50 MG/ML IJ SOLN: 12.5000 mg | INTRAMUSCULAR | Status: DC | PRN

## 2022-06-09 MED ORDER — AMISULPRIDE (ANTIEMETIC) 5 MG/2ML IV SOLN: 10.0000 mg | Freq: Once | INTRAVENOUS | Status: DC | PRN

## 2022-06-09 MED ORDER — ONDANSETRON HCL 4 MG/2ML IJ SOLN
INTRAMUSCULAR | Status: DC | PRN
  Administered 2022-06-09: 4 mg via INTRAVENOUS

## 2022-06-09 MED ORDER — FENTANYL CITRATE (PF) 100 MCG/2ML IJ SOLN
100.0000 ug | INTRAMUSCULAR | Status: DC | PRN
  Administered 2022-06-09 (×2): 100 ug via INTRAVENOUS
  Filled 2022-06-09 (×2): qty 2

## 2022-06-09 MED ORDER — KETOROLAC TROMETHAMINE 30 MG/ML IJ SOLN: 30.0000 mg | Freq: Four times a day (QID) | INTRAMUSCULAR | Status: DC | PRN

## 2022-06-09 MED ORDER — SODIUM CHLORIDE 0.9 % IV SOLN: INTRAVENOUS | Status: DC | PRN

## 2022-06-09 MED ORDER — OXYTOCIN-SODIUM CHLORIDE 30-0.9 UT/500ML-% IV SOLN
2.5000 [IU]/h | INTRAVENOUS | Status: AC
  Administered 2022-06-09: 2.5 [IU]/h via INTRAVENOUS

## 2022-06-09 MED ORDER — LACTATED RINGERS IV SOLN: INTRAVENOUS | Status: DC

## 2022-06-09 MED ORDER — EPHEDRINE 5 MG/ML INJ
10.0000 mg | INTRAVENOUS | Status: AC | PRN
  Administered 2022-06-09 (×2): 10 mg via INTRAVENOUS

## 2022-06-09 MED ORDER — ACETAMINOPHEN 325 MG PO TABS: 650.0000 mg | ORAL_TABLET | ORAL | Status: DC | PRN

## 2022-06-09 MED ORDER — ONDANSETRON HCL 4 MG/2ML IJ SOLN: 4.0000 mg | Freq: Three times a day (TID) | INTRAMUSCULAR | Status: DC | PRN

## 2022-06-09 MED ORDER — SODIUM CHLORIDE 0.9% FLUSH: 3.0000 mL | INTRAVENOUS | Status: DC | PRN

## 2022-06-09 MED ORDER — WITCH HAZEL-GLYCERIN EX PADS: 1.0000 | MEDICATED_PAD | CUTANEOUS | Status: DC | PRN

## 2022-06-09 MED ORDER — KETOROLAC TROMETHAMINE 30 MG/ML IJ SOLN
30.0000 mg | Freq: Four times a day (QID) | INTRAMUSCULAR | Status: AC
  Administered 2022-06-09: 30 mg via INTRAVENOUS
  Filled 2022-06-09: qty 1

## 2022-06-09 MED ORDER — DEXMEDETOMIDINE HCL IN NACL 80 MCG/20ML IV SOLN
INTRAVENOUS | Status: AC
  Filled 2022-06-09: qty 20

## 2022-06-09 MED ORDER — OXYCODONE HCL 5 MG PO TABS: 5.0000 mg | ORAL_TABLET | Freq: Once | ORAL | Status: DC | PRN

## 2022-06-09 MED ORDER — LACTATED RINGERS IV SOLN
500.0000 mL | Freq: Once | INTRAVENOUS | Status: AC
  Administered 2022-06-09: 500 mL via INTRAVENOUS

## 2022-06-09 MED ORDER — OXYTOCIN BOLUS FROM INFUSION: 333.0000 mL | Freq: Once | INTRAVENOUS | Status: DC

## 2022-06-09 MED ORDER — SODIUM CHLORIDE 0.9 % IV SOLN
INTRAVENOUS | Status: DC | PRN
  Administered 2022-06-09: 500 mg via INTRAVENOUS

## 2022-06-09 MED ORDER — DEXAMETHASONE SODIUM PHOSPHATE 10 MG/ML IJ SOLN
INTRAMUSCULAR | Status: DC | PRN
  Administered 2022-06-09: 10 mg via INTRAVENOUS

## 2022-06-09 MED ORDER — ONDANSETRON HCL 4 MG/2ML IJ SOLN: 4.0000 mg | Freq: Four times a day (QID) | INTRAMUSCULAR | Status: DC | PRN

## 2022-06-09 MED ORDER — PHENYLEPHRINE 80 MCG/ML (10ML) SYRINGE FOR IV PUSH (FOR BLOOD PRESSURE SUPPORT): 80.0000 ug | PREFILLED_SYRINGE | INTRAVENOUS | Status: DC | PRN

## 2022-06-09 MED ORDER — MEPERIDINE HCL 25 MG/ML IJ SOLN: 6.2500 mg | INTRAMUSCULAR | Status: DC | PRN

## 2022-06-09 MED ORDER — MIDAZOLAM HCL 2 MG/2ML IJ SOLN
INTRAMUSCULAR | Status: DC | PRN
  Administered 2022-06-09: 2 mg via INTRAVENOUS

## 2022-06-09 MED ORDER — FENTANYL CITRATE (PF) 100 MCG/2ML IJ SOLN
INTRAMUSCULAR | Status: AC
  Filled 2022-06-09: qty 2

## 2022-06-09 MED ORDER — TETANUS-DIPHTH-ACELL PERTUSSIS 5-2.5-18.5 LF-MCG/0.5 IM SUSY
0.5000 mL | PREFILLED_SYRINGE | Freq: Once | INTRAMUSCULAR | Status: AC
  Administered 2022-06-10: 0.5 mL via INTRAMUSCULAR
  Filled 2022-06-09: qty 0.5

## 2022-06-09 MED ORDER — OXYCODONE-ACETAMINOPHEN 5-325 MG PO TABS: 2.0000 | ORAL_TABLET | ORAL | Status: DC | PRN

## 2022-06-09 MED ORDER — TRANEXAMIC ACID-NACL 1000-0.7 MG/100ML-% IV SOLN
INTRAVENOUS | Status: DC | PRN
  Administered 2022-06-09: 1000 mg via INTRAVENOUS

## 2022-06-09 MED ORDER — MIDAZOLAM HCL 2 MG/2ML IJ SOLN
INTRAMUSCULAR | Status: AC
  Filled 2022-06-09: qty 2

## 2022-06-09 MED ORDER — SODIUM CHLORIDE 0.9 % IV SOLN: 1.0000 g | INTRAVENOUS | Status: DC

## 2022-06-09 MED ORDER — NALOXONE HCL 4 MG/10ML IJ SOLN: 1.0000 ug/kg/h | INTRAVENOUS | Status: DC | PRN

## 2022-06-09 MED ORDER — MENTHOL 3 MG MT LOZG: 1.0000 | LOZENGE | OROMUCOSAL | Status: DC | PRN

## 2022-06-09 MED ORDER — KETOROLAC TROMETHAMINE 30 MG/ML IJ SOLN: 30.0000 mg | Freq: Once | INTRAMUSCULAR | Status: DC | PRN

## 2022-06-09 MED ORDER — SODIUM BICARBONATE 8.4 % IV SOLN
INTRAVENOUS | Status: DC | PRN
  Administered 2022-06-09: 10 mL via EPIDURAL

## 2022-06-09 MED ORDER — ACETAMINOPHEN 500 MG PO TABS
1000.0000 mg | ORAL_TABLET | Freq: Four times a day (QID) | ORAL | Status: AC
  Administered 2022-06-09 – 2022-06-10 (×4): 1000 mg via ORAL
  Filled 2022-06-09 (×4): qty 2

## 2022-06-09 MED ORDER — CEFAZOLIN SODIUM-DEXTROSE 2-4 GM/100ML-% IV SOLN
INTRAVENOUS | Status: AC
  Filled 2022-06-09: qty 100

## 2022-06-09 MED ORDER — TRANEXAMIC ACID-NACL 1000-0.7 MG/100ML-% IV SOLN
INTRAVENOUS | Status: AC
  Filled 2022-06-09: qty 100

## 2022-06-09 MED ORDER — OXYCODONE-ACETAMINOPHEN 5-325 MG PO TABS: 1.0000 | ORAL_TABLET | ORAL | Status: DC | PRN

## 2022-06-09 MED ORDER — OXYCODONE HCL 5 MG PO TABS
5.0000 mg | ORAL_TABLET | ORAL | Status: DC | PRN
  Administered 2022-06-10 – 2022-06-12 (×7): 10 mg via ORAL
  Filled 2022-06-09 (×7): qty 2

## 2022-06-09 MED ORDER — FENTANYL CITRATE (PF) 100 MCG/2ML IJ SOLN
INTRAMUSCULAR | Status: DC | PRN
  Administered 2022-06-09: 100 ug via EPIDURAL

## 2022-06-09 MED ORDER — OXYTOCIN-SODIUM CHLORIDE 30-0.9 UT/500ML-% IV SOLN
INTRAVENOUS | Status: AC
  Filled 2022-06-09: qty 500

## 2022-06-09 MED ORDER — FENTANYL CITRATE (PF) 100 MCG/2ML IJ SOLN: 50.0000 ug | INTRAMUSCULAR | Status: DC | PRN

## 2022-06-09 MED ORDER — CEFAZOLIN SODIUM-DEXTROSE 2-4 GM/100ML-% IV SOLN
INTRAVENOUS | Status: AC
  Filled 2022-06-09: qty 200

## 2022-06-09 MED ORDER — MORPHINE SULFATE (PF) 0.5 MG/ML IJ SOLN
INTRAMUSCULAR | Status: AC
  Filled 2022-06-09: qty 10

## 2022-06-09 MED ORDER — ALBUMIN HUMAN 5 % IV SOLN
INTRAVENOUS | Status: AC
  Filled 2022-06-09: qty 250

## 2022-06-09 MED ORDER — OXYCODONE HCL 5 MG/5ML PO SOLN: 5.0000 mg | Freq: Once | ORAL | Status: DC | PRN

## 2022-06-09 MED ORDER — SIMETHICONE 80 MG PO CHEW: 80.0000 mg | CHEWABLE_TABLET | ORAL | Status: DC | PRN

## 2022-06-09 MED ORDER — ONDANSETRON HCL 4 MG/2ML IJ SOLN: 4.0000 mg | Freq: Once | INTRAMUSCULAR | Status: DC | PRN

## 2022-06-09 MED ORDER — ZOLPIDEM TARTRATE 5 MG PO TABS
5.0000 mg | ORAL_TABLET | Freq: Every evening | ORAL | Status: DC | PRN
  Administered 2022-06-10: 5 mg via ORAL
  Filled 2022-06-09: qty 1

## 2022-06-09 MED ORDER — SODIUM CHLORIDE 0.9 % IV SOLN
2.0000 g | Freq: Once | INTRAVENOUS | Status: AC
  Administered 2022-06-09: 2 g via INTRAVENOUS
  Filled 2022-06-09: qty 2000

## 2022-06-09 MED ORDER — LIDOCAINE-EPINEPHRINE (PF) 2 %-1:200000 IJ SOLN
INTRAMUSCULAR | Status: AC
  Filled 2022-06-09: qty 20

## 2022-06-09 MED ORDER — FENTANYL CITRATE (PF) 100 MCG/2ML IJ SOLN
100.0000 ug | Freq: Once | INTRAMUSCULAR | Status: AC
  Administered 2022-06-09: 100 ug via INTRAVENOUS

## 2022-06-09 MED ORDER — SCOPOLAMINE 1 MG/3DAYS TD PT72
1.0000 | MEDICATED_PATCH | Freq: Once | TRANSDERMAL | Status: DC
  Administered 2022-06-09: 1.5 mg via TRANSDERMAL
  Filled 2022-06-09: qty 1

## 2022-06-09 MED ORDER — CEFAZOLIN SODIUM-DEXTROSE 2-3 GM-%(50ML) IV SOLR
INTRAVENOUS | Status: DC | PRN
  Administered 2022-06-09: 2 g via INTRAVENOUS

## 2022-06-09 MED ORDER — SODIUM CHLORIDE 0.9 % IR SOLN
Status: DC | PRN
  Administered 2022-06-09: 1

## 2022-06-09 MED ORDER — FENTANYL-BUPIVACAINE-NACL 0.5-0.125-0.9 MG/250ML-% EP SOLN
12.0000 mL/h | EPIDURAL | Status: DC | PRN
  Filled 2022-06-09: qty 250

## 2022-06-09 MED ORDER — LACTATED RINGERS IV SOLN: 500.0000 mL | INTRAVENOUS | Status: DC | PRN

## 2022-06-09 MED ORDER — MORPHINE SULFATE (PF) 0.5 MG/ML IJ SOLN
INTRAMUSCULAR | Status: DC | PRN
  Administered 2022-06-09: 3 mg via EPIDURAL

## 2022-06-09 MED ORDER — LIDOCAINE HCL (PF) 1 % IJ SOLN: 30.0000 mL | INTRAMUSCULAR | Status: DC | PRN

## 2022-06-09 MED ORDER — OXYTOCIN-SODIUM CHLORIDE 30-0.9 UT/500ML-% IV SOLN
INTRAVENOUS | Status: DC | PRN
  Administered 2022-06-09: 300 mL via INTRAVENOUS

## 2022-06-09 MED ORDER — DEXAMETHASONE SODIUM PHOSPHATE 10 MG/ML IJ SOLN
INTRAMUSCULAR | Status: AC
  Filled 2022-06-09: qty 1

## 2022-06-09 MED ORDER — SODIUM CHLORIDE 0.9% IV SOLUTION: Freq: Once | INTRAVENOUS | Status: DC

## 2022-06-09 MED ORDER — DIPHENHYDRAMINE HCL 25 MG PO CAPS
25.0000 mg | ORAL_CAPSULE | Freq: Four times a day (QID) | ORAL | Status: DC | PRN
  Administered 2022-06-10: 25 mg via ORAL

## 2022-06-09 MED ORDER — OXYTOCIN-SODIUM CHLORIDE 30-0.9 UT/500ML-% IV SOLN
2.5000 [IU]/h | INTRAVENOUS | Status: DC
  Filled 2022-06-09: qty 500

## 2022-06-09 MED ORDER — ONDANSETRON HCL 4 MG/2ML IJ SOLN
INTRAMUSCULAR | Status: AC
  Filled 2022-06-09: qty 2

## 2022-06-09 MED ORDER — ENOXAPARIN SODIUM 60 MG/0.6ML IJ SOSY
50.0000 mg | PREFILLED_SYRINGE | INTRAMUSCULAR | Status: DC
  Administered 2022-06-10 – 2022-06-12 (×3): 50 mg via SUBCUTANEOUS
  Filled 2022-06-09 (×3): qty 0.6

## 2022-06-09 MED ORDER — PHENYLEPHRINE HCL-NACL 20-0.9 MG/250ML-% IV SOLN
INTRAVENOUS | Status: AC
  Filled 2022-06-09: qty 250

## 2022-06-09 MED ORDER — COCONUT OIL OIL: 1.0000 | TOPICAL_OIL | Status: DC | PRN

## 2022-06-09 MED ORDER — PHENYLEPHRINE HCL-NACL 20-0.9 MG/250ML-% IV SOLN
INTRAVENOUS | Status: DC | PRN
  Administered 2022-06-09: 60 ug/min via INTRAVENOUS

## 2022-06-09 MED ORDER — SIMETHICONE 80 MG PO CHEW
80.0000 mg | CHEWABLE_TABLET | Freq: Three times a day (TID) | ORAL | Status: DC
  Administered 2022-06-10 – 2022-06-12 (×6): 80 mg via ORAL
  Filled 2022-06-09 (×7): qty 1

## 2022-06-09 MED ORDER — EPHEDRINE 5 MG/ML INJ
10.0000 mg | INTRAVENOUS | Status: DC | PRN
  Filled 2022-06-09: qty 5

## 2022-06-09 SURGICAL SUPPLY — 39 items
BARRIER ADHS 3X4 INTERCEED (GAUZE/BANDAGES/DRESSINGS) ×1 IMPLANT
BENZOIN TINCTURE PRP APPL 2/3 (GAUZE/BANDAGES/DRESSINGS) IMPLANT
CHLORAPREP W/TINT 26 (MISCELLANEOUS) ×4 IMPLANT
CLAMP UMBILICAL CORD (MISCELLANEOUS) ×2 IMPLANT
CLOTH BEACON ORANGE TIMEOUT ST (SAFETY) ×2 IMPLANT
DRSG OPSITE POSTOP 4X10 (GAUZE/BANDAGES/DRESSINGS) ×2 IMPLANT
ELECT REM PT RETURN 9FT ADLT (ELECTROSURGICAL) ×2
ELECTRODE REM PT RTRN 9FT ADLT (ELECTROSURGICAL) ×1 IMPLANT
EXTRACTOR VACUUM KIWI (MISCELLANEOUS) IMPLANT
GAUZE SPONGE 4X4 12PLY STRL LF (GAUZE/BANDAGES/DRESSINGS) ×2 IMPLANT
GLOVE SURG ORTHO 8.0 STRL STRW (GLOVE) ×2 IMPLANT
GOWN STRL REUS W/TWL LRG LVL3 (GOWN DISPOSABLE) ×4 IMPLANT
HEMOSTAT SURGICEL 4X8 (HEMOSTASIS) ×1 IMPLANT
KIT ABG SYR 3ML LUER SLIP (SYRINGE) IMPLANT
MAT PREVALON FULL STRYKER (MISCELLANEOUS) ×1 IMPLANT
NDL HYPO 25X5/8 SAFETYGLIDE (NEEDLE) IMPLANT
NEEDLE HYPO 25X5/8 SAFETYGLIDE (NEEDLE) IMPLANT
NS IRRIG 1000ML POUR BTL (IV SOLUTION) ×2 IMPLANT
PACK C SECTION WH (CUSTOM PROCEDURE TRAY) ×2 IMPLANT
PAD ABD 7.5X8 STRL (GAUZE/BANDAGES/DRESSINGS) ×1 IMPLANT
PAD OB MATERNITY 4.3X12.25 (PERSONAL CARE ITEMS) ×2 IMPLANT
RTRCTR C-SECT PINK 25CM LRG (MISCELLANEOUS) IMPLANT
SPONGE T-LAP 18X18 ~~LOC~~+RFID (SPONGE) ×2 IMPLANT
STRIP CLOSURE SKIN 1/2X4 (GAUZE/BANDAGES/DRESSINGS) IMPLANT
SUT MNCRL 0 VIOLET CTX 36 (SUTURE) IMPLANT
SUT MON AB-0 CT1 36 (SUTURE) ×4 IMPLANT
SUT MONOCRYL 0 CTX 36 (SUTURE) ×4
SUT PLAIN 0 NONE (SUTURE) IMPLANT
SUT VIC AB 0 CT1 27 (SUTURE) ×4
SUT VIC AB 0 CT1 27XBRD ANBCTR (SUTURE) ×2 IMPLANT
SUT VIC AB 0 CT1 36 (SUTURE) ×4 IMPLANT
SUT VIC AB 2-0 CT1 27 (SUTURE) ×4
SUT VIC AB 2-0 CT1 TAPERPNT 27 (SUTURE) ×1 IMPLANT
SUT VIC AB 4-0 KS 27 (SUTURE) ×1 IMPLANT
SUT VIC AB 4-0 SH 27 (SUTURE) ×2
SUT VIC AB 4-0 SH 27XANBCTRL (SUTURE) ×1 IMPLANT
TOWEL OR 17X24 6PK STRL BLUE (TOWEL DISPOSABLE) ×2 IMPLANT
TRAY FOLEY W/BAG SLVR 14FR LF (SET/KITS/TRAYS/PACK) ×2 IMPLANT
WATER STERILE IRR 1000ML POUR (IV SOLUTION) ×2 IMPLANT

## 2022-06-09 NOTE — Anesthesia Procedure Notes (Addendum)
Epidural Patient location during procedure: OB Start time: 06/09/2022 1:00 PM End time: 06/09/2022 1:07 PM  Staffing Anesthesiologist: Lannie Fields, DO Performed: anesthesiologist   Preanesthetic Checklist Completed: patient identified, IV checked, risks and benefits discussed, monitors and equipment checked, pre-op evaluation and timeout performed  Epidural Patient position: sitting Prep: DuraPrep and site prepped and draped Patient monitoring: continuous pulse ox, blood pressure, heart rate and cardiac monitor Approach: midline Location: L3-L4 Injection technique: LOR air  Needle:  Needle type: Tuohy  Needle gauge: 17 G Needle length: 9 cm Needle insertion depth: 7 cm Catheter type: closed end flexible Catheter size: 19 Gauge Catheter at skin depth: 12 cm Test dose: negative  Assessment Sensory level: T8 Events: blood not aspirated, injection not painful, no injection resistance, no paresthesia and negative IV test  Additional Notes Patient identified. Risks/Benefits/Options discussed with patient including but not limited to bleeding, infection, nerve damage, paralysis, failed block, incomplete pain control, headache, blood pressure changes, nausea, vomiting, reactions to medication both or allergic, itching and postpartum back pain. Confirmed with bedside nurse the patient's most recent platelet count. Confirmed with patient that they are not currently taking any anticoagulation, have any bleeding history or any family history of bleeding disorders. Patient expressed understanding and wished to proceed. All questions were answered. Sterile technique was used throughout the entire procedure. Please see nursing notes for vital signs. Test dose was given through epidural catheter and negative prior to continuing to dose epidural or start infusion. Warning signs of high block given to the patient including shortness of breath, tingling/numbness in hands, complete motor  block, or any concerning symptoms with instructions to call for help. Patient was given instructions on fall risk and not to get out of bed. All questions and concerns addressed with instructions to call with any issues or inadequate analgesia.     Very uncooperative with procedure, required IV fentanyl to stay still Reason for block:procedure for pain and surgical anesthesia

## 2022-06-09 NOTE — Transfer of Care (Signed)
Immediate Anesthesia Transfer of Care Note  Patient: [name]  Procedure(s) Performed: CESAREAN SECTION  Patient Location: PACU  Anesthesia Type:Epidural  Level of Consciousness: awake, alert  and oriented  Airway & Oxygen Therapy: Patient Spontanous Breathing  Post-op Assessment: Report given to RN and Post -op Vital signs reviewed and stable  Post vital signs: Reviewed and stable  Last Vitals:  Vitals Value Taken Time  BP    Temp    Pulse 73 06/09/22 1526  Resp 15 06/09/22 1526  SpO2 98 % 06/09/22 1526  Vitals shown include unvalidated device data.  Last Pain:  Vitals:   06/09/22 1215  TempSrc:   PainSc: 5          Complications: No notable events documented.

## 2022-06-09 NOTE — H&P (Signed)
[name] is a 36 y.o. female presenting for active labor.  One c-section at 26 weeks Then VBAC of preterm infant with neonatal demise Then 3 more VBACs at term   OB History     Gravida  6   Para  5   Term  2   Preterm  3   AB      Living  4      SAB      IAB      Ectopic      Multiple  0   Live Births  5          Past Medical History:  Diagnosis Date   Asthma    History of pre-eclampsia    Hx of gallstones    Obesity    Past Surgical History:  Procedure Laterality Date   BREAST BIOPSY Left 07/31/2013   Procedure: IRRIGATION AND DEBRIDMENT LEFT BREAST ABSCESS;  Surgeon: Lodema Pilot, DO;  Location: [facility];  Service: General;  Laterality: Left;  IRRIGATION AND DEBRIDEMENT LEFT BREAST ABSCESS   CESAREAN SECTION     Family History: family history includes Diabetes in her father. Social History:  reports that she has been smoking cigarettes. She has never used smokeless tobacco. She reports that she does not currently use alcohol. She reports that she does not currently use drugs.     Maternal Diabetes: No Genetic Screening: Declined Maternal Ultrasounds/Referrals: Other:None, no prenatal care  Fetal Ultrasounds or other Referrals:  None Maternal Substance Abuse:  No Significant Maternal Medications:  None Significant Maternal Lab Results:  None Other Comments:   No prenatal care. 1 prior c-section, with 4 VBACs.   Review of Systems  All other systems reviewed and are negative.  Maternal Medical History:  Reason for admission: Contractions.   Contractions: Onset was 3-5 hours ago.   Frequency: regular.   Duration is approximately 60 seconds.   Perceived severity is strong.   Fetal activity: Perceived fetal activity is normal.   Prenatal complications: No prenatal care  Prenatal Complications - Diabetes: none.   Dilation: 7 Exam by:: Erle Crocker, RN Blood pressure (!) 139/95, pulse 90, temperature 98.3 F (36.8 C), temperature  source Oral, resp. rate 19, SpO2 100 %, unknown if currently breastfeeding. Maternal Exam:  Uterine Assessment: Contraction strength is firm.  Contraction duration is 60 seconds. Contraction frequency is regular.  Abdomen: Patient reports no abdominal tenderness. Surgical scars: low transverse.   Fetal presentation: vertex Introitus: Normal vulva. Normal vagina.  Vaginal discharge: mucusy.  Pelvis: adequate for delivery.   Cervix: Cervix evaluated by digital exam.     Fetal Exam Fetal Monitor Review: Mode: ultrasound.   Baseline rate: 150.  Variability: moderate (6-25 bpm).   Pattern: accelerations present and no decelerations.   Fetal State Assessment: Category I - tracings are normal.   Physical Exam Constitutional:      Appearance: She is well-developed.  HENT:     Head: Normocephalic.  Eyes:     Pupils: Pupils are equal, round, and reactive to light.  Cardiovascular:     Rate and Rhythm: Normal rate and regular rhythm.     Heart sounds: Normal heart sounds.  Pulmonary:     Effort: Pulmonary effort is normal. No respiratory distress.     Breath sounds: Normal breath sounds.  Abdominal:     Palpations: Abdomen is soft.     Tenderness: There is no abdominal tenderness.  Genitourinary:    General: Normal vulva.  Vagina: No bleeding. Vaginal discharge: mucusy.    Comments: External: no lesion Vagina: small amount of white discharge Dilation: 7 Presentation: Undeterminable Exam by:: Erle Crocker, RN   Musculoskeletal:        General: Normal range of motion.     Cervical back: Normal range of motion and neck supple.  Skin:    General: Skin is warm and dry.  Neurological:     Mental Status: She is alert and oriented to person, place, and time.  Psychiatric:        Mood and Affect: Mood normal.        Behavior: Behavior normal.      Pt informed that the ultrasound is considered a limited OB ultrasound and is not intended to be a complete ultrasound exam.   Patient also informed that the ultrasound is not being completed with the intent of assessing for fetal or placental anomalies or any pelvic abnormalities.  Explained that the purpose of today's ultrasound is to assess for  presentation.  Patient acknowledges the purpose of the exam and the limitations of the study.   VERTEX   Prenatal labs: not done due to no pre-natal care. Will need to collect while inpatient.  ABO, Rh:  B+ Antibody:   Rubella:   RPR:    HBsAg:    HIV:    GBS:   unknown, but + in prior pregnancy   Assessment/Plan: 36 y.o. [id number] at [redacted]w[redacted]d  Active labor Admit to labor and delivery  Vertex by [place] today  No prenatal care Prior c-section x 1, with 4 successful VBACs   Unkown GBS, preterm   Admit to labor and delivery  Treat with amp for unknown GBS and preterm status  Anticipate NSVD     Thressa Sheller DNP, CNM  06/09/22  10:23 AM

## 2022-06-10 ENCOUNTER — Encounter (HOSPITAL_COMMUNITY): Payer: Self-pay | Admitting: Obstetrics and Gynecology

## 2022-06-10 LAB — BPAM RBC
Blood Product Expiration Date: 202307162359
Blood Product Expiration Date: 202307162359
Blood Product Expiration Date: 202307172359
Blood Product Expiration Date: 202307172359
ISSUE DATE / TIME: 202306241349
ISSUE DATE / TIME: 202306241349
Unit Type and Rh: 7300
Unit Type and Rh: 7300
Unit Type and Rh: 7300
Unit Type and Rh: 7300

## 2022-06-10 LAB — TYPE AND SCREEN
ABO/RH(D): B POS
Antibody Screen: NEGATIVE
Unit division: 0
Unit division: 0
Unit division: 0
Unit division: 0

## 2022-06-10 LAB — CBC
HCT: 32.2 % — ABNORMAL LOW (ref 36.0–46.0)
Hemoglobin: 11.1 g/dL — ABNORMAL LOW (ref 12.0–15.0)
MCH: 31.6 pg (ref 26.0–34.0)
MCHC: 34.5 g/dL (ref 30.0–36.0)
MCV: 91.7 fL (ref 80.0–100.0)
Platelets: 279 10*3/uL (ref 150–400)
RBC: 3.51 MIL/uL — ABNORMAL LOW (ref 3.87–5.11)
RDW: 16.3 % — ABNORMAL HIGH (ref 11.5–15.5)
WBC: 22 10*3/uL — ABNORMAL HIGH (ref 4.0–10.5)
nRBC: 0 % (ref 0.0–0.2)

## 2022-06-10 LAB — RPR: RPR Ser Ql: NONREACTIVE

## 2022-06-10 NOTE — Lactation Note (Signed)
This note was copied from a baby's chart.  NICU Lactation Consultation Note  Patient Name: [name] [id number] Date: 06/10/2022 Age:37 hours  Subjective Reason for consult: Initial assessment; Other (Comment); NICU baby; Late-preterm 34-36.6wks (No PNC)  Visited with mom of 20 hours old LPI NICU female, she's a P6 but has limited experience breastfeeding. Her plan is to do both breast and formula, baby will be starting formula feds today and will continue to do so for the next 5 days until the results from the cord blood are back. Ms. Mcgreevy has a history of substance use from 2012 and 2021 and no prenatal care for this baby; maternal UDS (+) for benzodiazepines, NICU staff will hold off on breastmilk feedings until getting a (-) cord blood test. Discussed these findings with mom and she was agreeable with plan. Pump has already been set up in her room, reviewed pump settings and storage guidelines. Ms. Schiefer is aware of pumping schedule, lactogenesis II and anticipatory guidelines.   Objective Infant data: In NICU  Maternal data: [id number]  C-Section, Classical Significant Breast History:: (+) breast changes Current breast feeding challenges:: NICU admission Does the patient have breastfeeding experience prior to this delivery?: Yes How long did the patient breastfeed?: Mom only breastfed her third child for a month Pumping frequency: q 3 hours (recommended) Risk factor for low milk supply:: prematurity, infant separation WIC Program: Yes WIC Referral Sent?: Yes  Assessment Infant: Feeding Status: NPO  Maternal: Breast are soft, mom practicing independent hand expression  Intervention/Plan Interventions: Breast feeding basics reviewed; DEBP; Education; [employer] brochure; Breast massage; Hand express Tools: Pump Pump Education: Setup, frequency, and cleaning; Milk Storage  Plan of care: Encouraged mom to pump at her own pace; she voiced she's doing both breast  and bottle and won't be pumping as much Hand expression and breast massage were also encouraged prior pumping Baby will be in formula for the next 5 days, will reassess after toxicology screening and social worker consult  FOB present. All questions and concerns answered, family to contact [facility] services PRN.  Consult Status: NICU follow-up NICU Follow-up type: New admission follow up; Maternal D/C visit; Verify onset of copious milk; Verify absence of engorgement   Lion Fernandez S Mallary Kreger 06/10/2022, 12:38 PM

## 2022-06-10 NOTE — Clinical Social Work Maternal (Signed)
CLINICAL SOCIAL WORK MATERNAL/CHILD NOTE  Patient Details  Name: [name] MRN: [id number] Date of Birth: [birth year]  Date:  06/10/2022  Clinical Social Worker Initiating Note:  Celso Sick[place], [place] Date/Time: Initiated:  06/10/22/1350     Child's Name:  Undecided at this time   Biological Parents:  Mother, Father (Father: Marigene Ehlers)   Need for Interpreter:  None   Reason for Referral:  Behavioral Health Concerns, Late or No Prenatal Care  , Current Substance Use/Substance Use During Pregnancy     Address:  [address] [place] [address]    Phone number:  [phone]  Additional phone number:   Household Members/Support Persons (HM/SP):   Household Member/Support Person 1, Household Member/Support Person 2, Household Member/Support Person 3, Household Member/Support Person 4   HM/SP Name Relationship DOB or Age  HM/SP -1 Kiandria Braisted son 07/24/04  HM/SP -2 Camarie Dahlia Client son 03/29/2011  HM/SP -3 Jahnide Stimpson-Seals son 06/24/21  HM/SP -4 Nakari Stimpson-Walker son 05/29/20  HM/SP -5        HM/SP -6        HM/SP -7        HM/SP -8          Natural Supports (not living in the home):      Professional Supports: Therapist   Employment: Unemployed   Type of Work:     Education:  Engineer, agricultural   Homebound arranged:    Surveyor, quantity Resources:  Medicaid   Other Resources:  Sales executive  , WIC   Cultural/Religious Considerations Which May Impact Care:    Strengths:  Ability to meet basic needs  , Merchandiser, retail, Understanding of illness   Psychotropic Medications:         Pediatrician:    Armed forces operational officer area  Pediatrician List:   [facility] ([address] [employer])  [place]    [place]      Pediatrician Fax Number:    Risk Factors/Current Problems:  Mental Health Concerns  , Substance Use  , DHHS Involvement  , Basic  Needs  , Transportation     Cognitive State:  Able to Concentrate  , Alert  , Linear Thinking  , Goal Oriented  , Insightful     Mood/Affect:  Calm  , Interested  , Comfortable  , Sad  , Tearful     CSW Assessment: CSW met with [name] at bedside to complete psychosocial assessment, FOB present. CSW introduced self and explained role. [name] was welcoming, open, pleasant, and remained engaged during assessment. [name] reported that she resides with her four older children. [name] reported that she is unemployed and receives both [facility] and food stamps. [name] reported that she only has one box of diapers for infant and needs all other items. [name] reported that she is waiting to see if someone will be providing a car seat for infant. CSW informed [name] about [facility]'s Clinical cytogeneticist for assistance with items for infant. [name] reported that a referral for all essential items and a pack and play would be helpful. CSW agreed to make referral. CSW inquired about [name]'s support system, [name] denied having any supports.   CSW and [name] discussed infant's NICU admission. CSW informed [name] about the NICU and resources/supports available during infant's NICU admission. [name] reported that they feel well informed about infant's care and shared that infant  may be transferring down to be with her later today. [name] reported that she does have transportation barriers with visiting infant in the NICU, if infant remains in the NICU. CSW informed [name] that Medicaid transportation could be utilized to visit with infant in the NICU, [name] confirmed being signed up for medicaid transportation. [name] reported that she had been looking for Child psychotherapist for meal vouchers. CSW explained meal voucher program and provided FOB with one meal voucher. CSW explained to [name] that meal vouchers could be provided to [facility] after discharge if infant remains in the NICU, [name] verbalized understanding. [name] denied any questions/concerns regarding the NICU and shared  that her oldest was also in the NICU.   CSW asked to speak with [name] privately, FOB left the room.   CSW informed [name] about hospital drug screen policy due to having no prenatal care. [name] reported that [facility] and [facility] but was too far along. CSW inquired about barriers to getting prenatal care, [name] reported that she didn't know she was pregnant and doesn't trust doctors as it is a part of her religion. CSW explained that infant's UDS and CDS would be monitored and a CPS report would be made if warranted. CSW inquired about any substance use during pregnancy, [name] initially denied substance use then reported marijuana use. [name] reported that she didn't smoke a lot and denied any additional substance use. [name] reported that she didn't know she was pregnant until she was eight months. [name] reported that she stopped when she found out. [name] became tearful and shared that she has an open case in [facility] CPS with social worker Johnnette Gourd) and they would take her kids if her infant was positive for any illegal substances. CSW acknowledged [name]'s fears and frustrations and reiterated the policy. [name] reported that she is currently participating in substance abuse counseling through [facility], virtually. [name] asked questions regarding CPS involvement, CSW agreed to keep [name] updated.   CSW inquired about [name]'s mental health history. [name] reported that she was diagnosed with PTSD during high school due to a traumatic experience. [name] denied any current symptoms of PTSD and reported that she is not taking any medication. [name] endorsed having postpartum depression 7 months ago when her children were out of her home. [name] shared that her children just returned to her home in January. [name] denied having any treatment for postpartum depression. [name] denied any additional mental health history. CSW inquired about Bipolar diagnosis mentioned in [name]'s chart, [name]  reported that she was diagnosed during high school and feels it is an accurate diagnosis. [name] denied any current symptoms of Bipolar Disorder. CSW inquired about Schizophrenia diagnosis, [name] reported that she was diagnosed in high school and feels the diagnosis is inaccurate. CSW inquired about any auditory or visual hallucinations, [name] denied any AVH. CSW inquired about how [name] was feeling emotionally since giving birth, [name] reported that she was feeling good but now horrible due to concerns with CPS. CSW acknowledged, normalized, and validated [name]'s feelings. CSW asked if there was anything CSW could do to be helpful, [name] reported just assisting with getting baby items. [name] presented calm and then tearful. [name] did not demonstrate any acute mental health signs/symptoms. CSW assessed for safety, [name] denied SI, HI, and domestic violence.   CSW provided education regarding the baby blues period vs. perinatal mood disorders, discussed treatment and gave resources for mental health follow up if  concerns arise.  CSW recommends self-evaluation during the postpartum time period using the New Mom Checklist from Postpartum Progress and encouraged [name] to contact a medical professional if symptoms are noted at any time.    CSW provided review of Sudden Infant Death Syndrome (SIDS) precautions.    CSW made [facility] CPS report due to [name]'s report of current open case and substance use during pregnancy.   Currently there are barriers to discharge, awaiting return call from [facility] CPS.     CSW Plan/Description:  Psychosocial Support and Ongoing Assessment of Needs, Sudden Infant Death Syndrome (SIDS) Education, Perinatal Mood and Anxiety Disorder (PMADs) Education, Hospital Drug Screen Policy Information, CSW Will Continue to Monitor Umbilical Cord Tissue Drug Screen Results and Make Report if Warranted, Child Protective Service Report  , CSW Awaiting CPS Disposition Plan, Other  Information/Referral to [employer], [place] 06/10/2022, 1:58 PM

## 2022-06-11 ENCOUNTER — Encounter (HOSPITAL_COMMUNITY): Payer: Self-pay | Admitting: Obstetrics and Gynecology

## 2022-06-11 MED ORDER — FUROSEMIDE 20 MG PO TABS
20.0000 mg | ORAL_TABLET | Freq: Two times a day (BID) | ORAL | Status: DC
  Administered 2022-06-11 – 2022-06-12 (×3): 20 mg via ORAL
  Filled 2022-06-11 (×3): qty 1

## 2022-06-11 MED ORDER — GABAPENTIN 600 MG PO TABS
300.0000 mg | ORAL_TABLET | Freq: Two times a day (BID) | ORAL | Status: DC
  Administered 2022-06-11 – 2022-06-12 (×3): 300 mg via ORAL
  Filled 2022-06-11 (×4): qty 0.5

## 2022-06-11 MED ORDER — NIFEDIPINE ER OSMOTIC RELEASE 30 MG PO TB24
30.0000 mg | ORAL_TABLET | Freq: Every day | ORAL | Status: DC
  Administered 2022-06-11: 30 mg via ORAL
  Filled 2022-06-11: qty 1

## 2022-06-11 MED ORDER — ACETAMINOPHEN 500 MG PO TABS
1000.0000 mg | ORAL_TABLET | Freq: Four times a day (QID) | ORAL | Status: DC
  Administered 2022-06-11 – 2022-06-12 (×5): 1000 mg via ORAL
  Filled 2022-06-11 (×6): qty 2

## 2022-06-12 ENCOUNTER — Inpatient Hospital Stay (HOSPITAL_COMMUNITY): Payer: Medicaid Other

## 2022-06-12 ENCOUNTER — Other Ambulatory Visit (HOSPITAL_COMMUNITY): Payer: Self-pay

## 2022-06-12 LAB — SURGICAL PATHOLOGY

## 2022-06-12 LAB — RUBELLA SCREEN: Rubella: 1 index (ref >0.99)

## 2022-06-12 MED ORDER — OXYCODONE-ACETAMINOPHEN 5-325 MG PO TABS
1.0000 | ORAL_TABLET | Freq: Four times a day (QID) | ORAL | 0 refills | Status: DC | PRN
  Filled 2022-06-12: qty 20, 3d supply, fill #0

## 2022-06-12 MED ORDER — BENZONATATE 100 MG PO CAPS
100.0000 mg | ORAL_CAPSULE | Freq: Three times a day (TID) | ORAL | Status: DC | PRN
  Administered 2022-06-12: 100 mg via ORAL
  Filled 2022-06-12: qty 1

## 2022-06-12 MED ORDER — NIFEDIPINE ER 30 MG PO TB24
30.0000 mg | ORAL_TABLET | Freq: Two times a day (BID) | ORAL | 1 refills | Status: DC
  Filled 2022-06-12: qty 60, 30d supply, fill #0

## 2022-06-12 MED ORDER — GUAIFENESIN ER 600 MG PO TB12
1200.0000 mg | ORAL_TABLET | Freq: Two times a day (BID) | ORAL | 0 refills | Status: DC
  Filled 2022-06-12: qty 20, 5d supply, fill #0

## 2022-06-12 MED ORDER — GUAIFENESIN ER 600 MG PO TB12
1200.0000 mg | ORAL_TABLET | Freq: Two times a day (BID) | ORAL | Status: DC
  Administered 2022-06-12: 1200 mg via ORAL
  Filled 2022-06-12 (×2): qty 2

## 2022-06-12 MED ORDER — LORATADINE 10 MG PO TABS
10.0000 mg | ORAL_TABLET | Freq: Every day | ORAL | Status: DC
  Administered 2022-06-12: 10 mg via ORAL
  Filled 2022-06-12: qty 1

## 2022-06-12 MED ORDER — FUROSEMIDE 20 MG PO TABS
20.0000 mg | ORAL_TABLET | Freq: Two times a day (BID) | ORAL | 0 refills | Status: DC
  Filled 2022-06-12: qty 5, 3d supply, fill #0

## 2022-06-12 MED ORDER — NIFEDIPINE ER OSMOTIC RELEASE 30 MG PO TB24
30.0000 mg | ORAL_TABLET | Freq: Two times a day (BID) | ORAL | Status: DC
  Administered 2022-06-12: 30 mg via ORAL
  Filled 2022-06-12: qty 1

## 2022-06-12 NOTE — Progress Notes (Signed)
Subjective: Postpartum Day 3: Cesarean Delivery Patient reports cough and chest pain today.  She reports cough since admission and inability to bring something up but today has new onset chest pain associated with this.  Objective: Vital signs in last 24 hours: Temp:  [97.6 F (36.4 C)-98.7 F (37.1 C)] 97.6 F (36.4 C) (06/27 0738) Pulse Rate:  [85-107] 85 (06/27 0738) Resp:  [17-20] 17 (06/27 0738) BP: (131-157)/(71-106) 137/82 (06/27 0738) SpO2:  [98 %-100 %] 99 % (06/27 0738) No intake or output data in the 24 hours ending 06/12/22 1000 Vitals:   06/11/22 2029 06/11/22 2329 06/12/22 0516 06/12/22 0738  BP: 131/71 (!) 141/87 (!) 144/86 137/82  Pulse: 98 89 89 85  Resp: 18 20 19 17   Temp: 98.7 F (37.1 C) 98.5 F (36.9 C) 98 F (36.7 C) 97.6 F (36.4 C)  TempSrc: Oral Oral  Oral  SpO2: 100% 100% 99% 99%  Weight:      Height:        Physical Exam:  General: alert, cooperative, and appears stated age 13: appropriate Uterine Fundus: firm Incision: no significant drainage DVT Evaluation: No evidence of DVT seen on physical exam.  Recent Labs    06/09/22 1452 06/10/22 0409  HGB 7.5* 11.1*  HCT 22.0* 32.2*    Assessment/Plan: Status post Cesarean section.  Continue current care. New onset cough and chest pain.  We will add Mucinex, Claritin, Tessalon, check chest x-ray. Increase Procardia to twice daily dosing Continue Lasix Consider discharge home later today if chest x-ray is negative and blood pressure control is good.  If not would consider discharge tomorrow.   Reva Bores, MD 06/12/2022, 9:59 AM

## 2022-06-18 ENCOUNTER — Telehealth (HOSPITAL_COMMUNITY): Payer: Self-pay | Admitting: *Deleted

## 2022-06-18 NOTE — Telephone Encounter (Signed)
Left phone voicemail message.  Duffy Rhody, RN 06-18-2022 at 11:13am

## 2022-06-22 ENCOUNTER — Ambulatory Visit: Payer: Medicaid Other

## 2022-07-24 ENCOUNTER — Ambulatory Visit: Payer: Medicaid Other

## 2022-07-31 ENCOUNTER — Telehealth (HOSPITAL_BASED_OUTPATIENT_CLINIC_OR_DEPARTMENT_OTHER): Payer: Self-pay | Admitting: Advanced Practice Midwife

## 2022-07-31 ENCOUNTER — Ambulatory Visit: Payer: Medicaid Other | Admitting: Advanced Practice Midwife

## 2022-07-31 NOTE — Telephone Encounter (Signed)
[id number] is scheduled today at 3:50 pm with CNM for postpartum visit. She had no PNC in her pregnancy and had follow up BP check that was a no show.   I called pt this morning to confirm today's appointment and to discuss plans for BTL. Pt may need to reschedule with MD for preop for BTL, or can come today if that best fits her schedule and we will work in BTL preop with MD today or at future appt.

## 2022-11-13 ENCOUNTER — Emergency Department (HOSPITAL_COMMUNITY)
Admission: EM | Admit: 2022-11-13 | Discharge: 2022-11-13 | Discharge status: Against Medical Advice | Payer: Commercial Managed Care - HMO | Attending: Emergency Medicine | Admitting: Emergency Medicine

## 2022-11-13 ENCOUNTER — Other Ambulatory Visit: Payer: Self-pay

## 2022-11-13 ENCOUNTER — Emergency Department (HOSPITAL_COMMUNITY): Payer: Commercial Managed Care - HMO

## 2022-11-13 ENCOUNTER — Encounter (HOSPITAL_COMMUNITY): Payer: Self-pay

## 2022-11-13 DIAGNOSIS — R509 Fever, unspecified: Secondary | ICD-10-CM | POA: Diagnosis not present

## 2022-11-13 DIAGNOSIS — R1084 Generalized abdominal pain: Secondary | ICD-10-CM | POA: Diagnosis not present

## 2022-11-13 DIAGNOSIS — R059 Cough, unspecified: Secondary | ICD-10-CM | POA: Insufficient documentation

## 2022-11-13 DIAGNOSIS — M791 Myalgia, unspecified site: Secondary | ICD-10-CM | POA: Insufficient documentation

## 2022-11-13 DIAGNOSIS — Z5321 Procedure and treatment not carried out due to patient leaving prior to being seen by health care provider: Secondary | ICD-10-CM | POA: Insufficient documentation

## 2022-11-13 DIAGNOSIS — Z1152 Encounter for screening for COVID-19: Secondary | ICD-10-CM | POA: Insufficient documentation

## 2022-11-13 DIAGNOSIS — R0981 Nasal congestion: Secondary | ICD-10-CM | POA: Insufficient documentation

## 2022-11-13 DIAGNOSIS — R0600 Dyspnea, unspecified: Secondary | ICD-10-CM | POA: Diagnosis not present

## 2022-11-13 DIAGNOSIS — J101 Influenza due to other identified influenza virus with other respiratory manifestations: Secondary | ICD-10-CM | POA: Diagnosis not present

## 2022-11-13 DIAGNOSIS — F17210 Nicotine dependence, cigarettes, uncomplicated: Secondary | ICD-10-CM | POA: Insufficient documentation

## 2022-11-13 LAB — RESP PANEL BY RT-PCR (FLU A&B, COVID) ARPGX2
Influenza A by PCR: NEGATIVE
Influenza B by PCR: POSITIVE — AB
SARS Coronavirus 2 by RT PCR: NEGATIVE

## 2022-11-13 LAB — PREGNANCY, URINE: Preg Test, Ur: NEGATIVE

## 2022-11-13 MED ORDER — ACETAMINOPHEN 325 MG PO TABS
650.0000 mg | ORAL_TABLET | Freq: Four times a day (QID) | ORAL | Status: DC | PRN
  Administered 2022-11-13: 650 mg via ORAL
  Filled 2022-11-13: qty 2

## 2022-11-13 NOTE — ED Notes (Signed)
Patient states that she is leaving due to wait times and having her "ride already being outside and waiting".

## 2022-11-13 NOTE — ED Provider Triage Note (Signed)
Emergency Medicine Provider Triage Evaluation Note  [name] , a 36 y.o. female  was evaluated in triage.  Pt complains of cough, congestion, generalized body aches, generalized abdominal pain, fever, and chills for the last 2 days. No nausea, vomiting, dizziness, dyspnea. Is heavy smoker with 1+ PPD. No known sick contacts.   Review of Systems  Positive: See HPI Negative: See HPI  Physical Exam  BP (!) 178/109 (BP Location: Right Arm)   Pulse 95   Temp (!) 100.6 F (38.1 C) (Oral)   Resp 20   Ht 5\' 3"  (1.6 m)   Wt 82.6 kg   LMP  (Within Weeks)   Breastfeeding No   BMI 32.24 kg/m  Gen:   Awake, no distress   Resp:  Normal effort coarse breath sounds to LUL, wheezing at bilateral bases MSK:   Moves extremities without difficulty  Other:  No abdominal tenderness, rebound, or guarding, alert and oriented, normal gait, moist mucus membranes  Medical Decision Making  Medically screening exam initiated at 6:23 PM.  Appropriate orders placed.  DANITRA PAYANO was informed that the remainder of the evaluation will be completed by another provider, this initial triage assessment does not replace that evaluation, and the importance of remaining in the ED until their evaluation is complete.  Pt presenting with various symptoms including cough, congestion, and fever, suspect likely viral etiology. Viral studies ordered for further eval, Tylenol ordered for fever, and CXR ordered due to coarse lung sounds on the right and bibasilar wheezing. No respiratory distress.    Kizzie Furnish, PA-C 11/13/22 1825

## 2022-11-13 NOTE — ED Triage Notes (Signed)
Pt arrives by [facility] from home for body aches that started 2 days ago. Congestion, cough, chills, Lower abdominal pain. Denies N/V/D.

## 2023-07-21 ENCOUNTER — Inpatient Hospital Stay (HOSPITAL_COMMUNITY): Payer: BLUE CROSS/BLUE SHIELD

## 2023-07-21 ENCOUNTER — Encounter (HOSPITAL_COMMUNITY): Payer: Self-pay

## 2023-07-21 ENCOUNTER — Inpatient Hospital Stay (HOSPITAL_COMMUNITY)
Admission: AD | Admit: 2023-07-21 | Discharge: 2023-07-21 | Disposition: A | Discharge status: Home / Self Care | Payer: BLUE CROSS/BLUE SHIELD | Attending: Obstetrics and Gynecology | Admitting: Obstetrics and Gynecology

## 2023-07-21 DIAGNOSIS — O034 Incomplete spontaneous abortion without complication: Secondary | ICD-10-CM | POA: Diagnosis present

## 2023-07-21 DIAGNOSIS — O039 Complete or unspecified spontaneous abortion without complication: Secondary | ICD-10-CM

## 2023-07-21 LAB — URINALYSIS, ROUTINE W REFLEX MICROSCOPIC
Bilirubin Urine: NEGATIVE
Glucose, UA: NEGATIVE mg/dL
Ketones, ur: 5 mg/dL — AB
Leukocytes,Ua: NEGATIVE
Nitrite: NEGATIVE
Protein, ur: 30 mg/dL — AB
RBC / HPF: >50 RBC/hpf (ref 0–5)
Specific Gravity, Urine: 1.028 (ref 1.005–1.030)
pH: 5 (ref 5.0–8.0)

## 2023-07-21 LAB — POCT PREGNANCY, URINE: Preg Test, Ur: POSITIVE — AB

## 2023-07-21 LAB — CBC
HCT: 36.7 % (ref 36.0–46.0)
Hemoglobin: 12.1 g/dL (ref 12.0–15.0)
MCH: 31.5 pg (ref 26.0–34.0)
MCHC: 33 g/dL (ref 30.0–36.0)
MCV: 95.6 fL (ref 80.0–100.0)
Platelets: 304 10*3/uL (ref 150–400)
RBC: 3.84 MIL/uL — ABNORMAL LOW (ref 3.87–5.11)
RDW: 13.5 % (ref 11.5–15.5)
WBC: 12.2 10*3/uL — ABNORMAL HIGH (ref 4.0–10.5)
nRBC: 0 % (ref 0.0–0.2)

## 2023-07-21 LAB — HCG, QUANTITATIVE, PREGNANCY: hCG, Beta Chain, Quant, S: 1098 m[IU]/mL — ABNORMAL HIGH (ref <5)

## 2023-07-21 LAB — WET PREP, GENITAL
Clue Cells Wet Prep HPF POC: NONE SEEN
Sperm: NONE SEEN
Trich, Wet Prep: NONE SEEN
WBC, Wet Prep HPF POC: <10 (ref <10)
Yeast Wet Prep HPF POC: NONE SEEN

## 2023-07-21 LAB — HIV ANTIBODY (ROUTINE TESTING W REFLEX): HIV Screen 4th Generation wRfx: NONREACTIVE

## 2023-07-21 MED ORDER — ONDANSETRON 8 MG PO TBDP: 8.0000 mg | ORAL_TABLET | Freq: Three times a day (TID) | ORAL | 0 refills | Status: DC | PRN

## 2023-07-21 MED ORDER — MISOPROSTOL 200 MCG PO TABS
800.0000 ug | ORAL_TABLET | Freq: Once | ORAL | Status: AC
  Administered 2023-07-21: 800 ug via BUCCAL
  Filled 2023-07-21: qty 4

## 2023-07-21 MED ORDER — MISOPROSTOL 200 MCG PO TABS: 400.0000 ug | ORAL_TABLET | Freq: Three times a day (TID) | ORAL | 0 refills | Status: DC

## 2023-07-21 MED ORDER — NAPROXEN 500 MG PO TBEC: 500.0000 mg | DELAYED_RELEASE_TABLET | Freq: Three times a day (TID) | ORAL | 0 refills | Status: DC

## 2023-07-21 NOTE — Discharge Instructions (Signed)
Cytotec for Pregnancy Failure FACTS YOU SHOULD KNOW  WHAT IS AN EARLY PREGNANCY FAILURE? Once the egg is fertilized with the sperm and begins to develop, it attaches to the lining of the uterus. This early pregnancy tissue may not develop into an embryo (the beginning stage of a baby). Sometimes an embryo does develop but does not continue to grow. These problems can be seen on ultrasound.   MANAGEMNT OF EARLY PREGNANCY FAILURE: About 4 out of 100 (0.25%) women will have a pregnancy loss in her lifetime.  One in five pregnancies is found to be an early pregnancy failure.  There are 3 ways to care for an early pregnancy failure:   Surgery, (2) Medicine, (3) Waiting for you to pass the pregnancy on your own. The decision as to how to proceed after being diagnosed with and early pregnancy failure is an individual one.  The decision can be made only after appropriate counseling.  You need to weigh the pros and cons of the 3 choices. Then you can make the choice that works for you. SURGERY (D&E) Procedure over in 1 day Requires being put to sleep Bleeding may be light Possible problems during surgery, including injury to womb(uterus) Care provider has more control Medicine (CYTOTEC) The complete procedure may take days to weeks No Surgery Bleeding may be heavy at times There may be drug side effects Patient has more control Waiting You may choose to wait, in which case your own body may complete the passing of the abnormal early pregnancy on its own in about 2-4 weeks Your bleeding may be heavy at times There is a small possibility that you may need surgery if the bleeding is too much or not all of the pregnancy has passed. CYTOTEC MANAGEMENT Prostaglandins (cytotec) are the most widely used drug for this purpose. They cause the uterus to cramp and contract. You will place the medicine yourself inside your vagina in the privacy of your home. Empting of the uterus should occur within 3 days but  the process may continue for several weeks. The bleeding may seem heavy at times. POSSIBLE SIDE EFFECTS FROM CYTOTEC Nausea   Vomiting Diarrhea Fever Chills  Hot Flashes Side effects  from the process of the early pregnancy failure include: Cramping  Bleeding Headaches  Dizziness RISKS: This is a low risk procedure. Less than 1 in 100 women has a complication. An incomplete passage of the early pregnancy may occur. Also, Hemorrhage (heavy bleeding) could happen.  Rarely the pregnancy will not be passed completely. Excessively heavy bleeding may occur.  Your doctor may need to perform surgery to empty the uterus (D&E). Afterwards: Everybody will feel differently after the early pregnancy completion. You may have soreness or cramps for a day or two. You may have soreness or cramps for day or two.  You may have light bleeding for up to 2 weeks. You may be as active as you feel like being. If you have any of the following problems you may call Maternity Admissions Unit at [phone]. If you have pain that does not get better  with pain medication Bleeding that soaks through 2 thick full-sized sanitary pads in an hour Cramps that last longer than 2 days Foul smelling discharge Fever above 100.4 degrees F Even if you do not have any of these symptoms, you should have a follow-up exam to make sure you are healing properly. This appointment will be made for you before you leave the hospital. Your next normal period will  start again in 4-6 week after the loss. You can get pregnant soon after the loss, so use birth control right away. Finally: Make sure all your questions are answered before during and after any procedure. Follow up with medical care and family planning methods.     

## 2023-07-21 NOTE — MAU Note (Addendum)
...  [name] is a 37 y.o. at Unknown here in MAU reporting: Vaginal bleeding that began yesterday. She reports she has passed two very large blood clots. She reports the bleeding has continued today and reports she is also experiencing cramping in her lower abdomen and reports it feels like contractions.   Reports she is taking birth control but reports she has not been taking them every day.   LMP: Beginning of May - one day of spotting in June.  Onset of complaint: Today Pain score: 8/10 lower abdomen  FHT: unable to doppler FHT - patient requested RN to attempt Lab orders placed from triage: POCT Preg, UA

## 2023-07-21 NOTE — MAU Provider Note (Signed)
History     CSN: [id number]  Arrival date and time: 07/21/23 1803   None     Chief Complaint  Patient presents with   Vaginal Bleeding   Miscarriage    [redacted]w[redacted]d with retained products of conception   HPI [name] is a 37 yo [id number] presenting via EMS for vaginal bleeding with LMP mid May and inconsistent OCP use.   Vaginal bleeding started yesterday with heavier than usual flow, followed by passage of lemon sized clots and cramping. She continued to bleed heavily throughout the day soaking 4-5 pads and having overflow bleeding on to her clothing. She denies lightheadedness, or tachycardia. She showed myself and the nurses photos of tow lemon sized clots; one from yesterday and one from today. Her bleeding and cramping improved after the last clot passed.   Denies desire for additional children. Has a 1, 2, and 3 yo at home.  Strongly desires Nexplanon for contraceptive if has had a spontaneous pregnancy loss.   Denies fever, chills, abdominal pain.   Past Medical History:  Diagnosis Date   Asthma    History of pre-eclampsia    Hx of gallstones    Obesity     Past Surgical History:  Procedure Laterality Date   BREAST BIOPSY Left 07/31/2013   Procedure: IRRIGATION AND DEBRIDMENT LEFT BREAST ABSCESS;  Surgeon: Lodema Pilot, DO;  Location: [facility]S;  Service: General;  Laterality: Left;  IRRIGATION AND DEBRIDEMENT LEFT BREAST ABSCESS   CESAREAN SECTION     CESAREAN SECTION  06/09/2022   Procedure: CESAREAN SECTION;  Surgeon: Warden Fillers, MD;  Location: [facility] LD [facility]S;  Service: Obstetrics;;    Family History  Problem Relation Age of Onset   Diabetes Father     Social History   Tobacco Use   Smoking status: Some Days    Types: Cigarettes   Smokeless tobacco: Never   Tobacco comments:    rare   Vaping Use   Vaping status: Never Used  Substance Use Topics   Alcohol use: Not Currently   Drug use: Not Currently    Allergies: No Known Allergies  Medications Prior  to Admission  Medication Sig Dispense Refill Last Dose   albuterol (VENTOLIN HFA) 108 (90 Base) MCG/ACT inhaler Inhale 1-2 puffs into the lungs every 6 (six) hours as needed for wheezing or shortness of breath. 6.7 g 0    Cholecalciferol (VITAMIN D3) 75 MCG (3000 UT) TABS Take 1 tablet by mouth daily.      furosemide (LASIX) 20 MG tablet Take 1 tablet (20 mg total) by mouth 2 (two) times daily. 5 tablet 0    guaiFENesin (MUCINEX) 600 MG 12 hr tablet Take 2 tablets (1,200 mg total) by mouth 2 (two) times daily. 20 tablet 0    NIFEdipine (ADALAT CC) 30 MG 24 hr tablet Take 1 tablet (30 mg total) by mouth 2 (two) times daily. 60 tablet 1    oxyCODONE-acetaminophen (PERCOCET/ROXICET) 5-325 MG tablet Take 1-2 tablets by mouth every 6 (six) hours as needed. 20 tablet 0    potassium chloride SA (KLOR-CON M) 20 MEQ tablet Take 1 tablet (20 mEq total) by mouth 2 (two) times daily for 5 days. 10 tablet 0    Prenat-Fe Poly-Methfol-FA-DHA (VITAFOL ULTRA) 29-0.6-0.4-200 MG CAPS Take 1 capsule by mouth daily before breakfast. 90 capsule 4    Prenatal Vit-Fe Fumarate-FA (MULTIVITAMIN-PRENATAL) 27-0.8 MG TABS tablet Take 1 tablet by mouth daily at 12 noon.      [DISCONTINUED] ondansetron (  ZOFRAN ODT) 8 MG disintegrating tablet Take 1 tablet (8 mg total) by mouth every 8 (eight) hours as needed for nausea or vomiting. 20 tablet 0    US OB LESS THAN 14 WEEKS WITH OB TRANSVAGINAL  Result Date: 07/21/2023 CLINICAL DATA:  Vaginal bleeding and pelvic cramping. EXAM: OBSTETRIC <14 WK [place] AND TRANSVAGINAL OB US TECHNIQUE: Both transabdominal and transvaginal ultrasound examinations were performed for complete evaluation of the gestation as well as the maternal uterus, adnexal regions, and pelvic cul-de-sac. Transvaginal technique was performed to assess early pregnancy. COMPARISON:  None Available. FINDINGS: Intrauterine gestational sac: Single, located within the lower uterine segment Yolk sac:  Visualized. Embryo:   Visualized. Cardiac Activity: Not Visualized. Heart Rate: N/A  bpm CRL:  19.4 mm   8 w   3 d Subchorionic hemorrhage:  Large Maternal uterus/adnexae: A 1.6 cm x 1.5 cm x 1.5 cm heterogeneous uterine fibroid is seen. The right ovary is visualized and is normal in appearance. The left ovary is not visualized. No pelvic free fluid is seen. IMPRESSION: Large subchorionic hemorrhage with findings that meet definitive criteria for failed pregnancy. This follows SRU consensus guidelines: Diagnostic Criteria for Nonviable Pregnancy Early in the First Trimester. [facility] Mis J Med [phone]. Electronically Signed   By: Aram Candela M.D.   On: 07/21/2023 20:08     Review of Systems Physical Exam   Blood pressure 114/77, pulse 84, temperature 97.8 F (36.6 C), temperature source Oral, resp. rate 18, height 5\' 3"  (1.6 m), weight 85.9 kg, last menstrual period 09/20/2022, SpO2 100%, not currently breastfeeding.  Physical Exam Exam conducted with a chaperone present.  Cardiovascular:     Rate and Rhythm: Normal rate.  Abdominal:     Palpations: Abdomen is soft.  Genitourinary:    General: Normal vulva.     Vagina: Vaginal discharge present.     Cervix: Dilated. Cervical bleeding present.     Uterus: Not tender.      Rectum: Normal.     Comments: Dark red blood per cervical os. Skin:    General: Skin is warm and dry.     Capillary Refill: Capillary refill takes less than 2 seconds.  Neurological:     Mental Status: She is alert. Mental status is at baseline.     MAU Course  Procedures  MDM Pregnancy of unknown location with first trimester bleeding evaluation, r/o ectopic vs spontaneous early pregnancy loss. Dilated cervical os with dark red bleeding supportive of possible miscarriage. Ultrasound consistent with pregnancy failure.   Assessment and Plan  Incomplete miscarriage via transvaginal ultrasound with nonviable pregnancy - discussed role of pharmaceutical therapy to complete  uterine evacuation of pregnancy vs in office vacuum aspiration vs operative D&C; patient strongly desires pharmaceutical therapy - misoprostol provided prior to discharge - take home misoprostol sent to preferred pharmacy to be continued TID x3 days - Follow up at [facility] in 1-2 weeks  Contraception - discussed risks and benefits of nexplanon, IUD, OCP - patient strongly desires nexplanon, message sent to [facility] for scheduling  Case discussed with Dorathy Kinsman CNM  Wyn Forster MD OB Fellow 07/21/2023, 8:52 PM

## 2023-08-14 ENCOUNTER — Emergency Department (HOSPITAL_COMMUNITY): Payer: BLUE CROSS/BLUE SHIELD

## 2023-08-14 ENCOUNTER — Encounter (HOSPITAL_COMMUNITY): Payer: Self-pay

## 2023-08-14 ENCOUNTER — Other Ambulatory Visit: Payer: Self-pay

## 2023-08-14 ENCOUNTER — Observation Stay (HOSPITAL_COMMUNITY)
Admission: EM | Admit: 2023-08-14 | Discharge: 2023-08-16 | Disposition: A | Discharge status: Home / Self Care | Payer: BLUE CROSS/BLUE SHIELD | Attending: Internal Medicine | Admitting: Internal Medicine

## 2023-08-14 DIAGNOSIS — N611 Abscess of the breast and nipple: Principal | ICD-10-CM

## 2023-08-14 DIAGNOSIS — Z6841 Body Mass Index (BMI) 40.0 and over, adult: Secondary | ICD-10-CM | POA: Insufficient documentation

## 2023-08-14 DIAGNOSIS — O036 Delayed or excessive hemorrhage following complete or unspecified spontaneous abortion: Secondary | ICD-10-CM

## 2023-08-14 DIAGNOSIS — F17290 Nicotine dependence, other tobacco product, uncomplicated: Secondary | ICD-10-CM | POA: Diagnosis not present

## 2023-08-14 DIAGNOSIS — E876 Hypokalemia: Secondary | ICD-10-CM | POA: Insufficient documentation

## 2023-08-14 DIAGNOSIS — Z720 Tobacco use: Secondary | ICD-10-CM | POA: Diagnosis not present

## 2023-08-14 DIAGNOSIS — E66813 Obesity, class 3: Secondary | ICD-10-CM | POA: Diagnosis present

## 2023-08-14 DIAGNOSIS — N939 Abnormal uterine and vaginal bleeding, unspecified: Secondary | ICD-10-CM | POA: Diagnosis not present

## 2023-08-14 DIAGNOSIS — Z3A00 Weeks of gestation of pregnancy not specified: Secondary | ICD-10-CM

## 2023-08-14 DIAGNOSIS — O021 Missed abortion: Secondary | ICD-10-CM | POA: Insufficient documentation

## 2023-08-14 LAB — CBC
HCT: 34.6 % — ABNORMAL LOW (ref 36.0–46.0)
Hemoglobin: 11.6 g/dL — ABNORMAL LOW (ref 12.0–15.0)
MCH: 32.5 pg (ref 26.0–34.0)
MCHC: 33.5 g/dL (ref 30.0–36.0)
MCV: 96.9 fL (ref 80.0–100.0)
Platelets: 325 10*3/uL (ref 150–400)
RBC: 3.57 MIL/uL — ABNORMAL LOW (ref 3.87–5.11)
RDW: 13.7 % (ref 11.5–15.5)
WBC: 16 10*3/uL — ABNORMAL HIGH (ref 4.0–10.5)
nRBC: 0 % (ref 0.0–0.2)

## 2023-08-14 LAB — TYPE AND SCREEN
ABO/RH(D): B POS
Antibody Screen: NEGATIVE

## 2023-08-14 LAB — BASIC METABOLIC PANEL
Anion gap: 10 (ref 5–15)
BUN: 8 mg/dL (ref 6–20)
CO2: 22 mmol/L (ref 22–32)
Calcium: 9 mg/dL (ref 8.9–10.3)
Chloride: 103 mmol/L (ref 98–111)
Creatinine, Ser: 0.79 mg/dL (ref 0.44–1.00)
GFR, Estimated: >60 mL/min (ref >60)
Glucose, Bld: 90 mg/dL (ref 70–99)
Potassium: 3.3 mmol/L — ABNORMAL LOW (ref 3.5–5.1)
Sodium: 135 mmol/L (ref 135–145)

## 2023-08-14 LAB — HCG, QUANTITATIVE, PREGNANCY: hCG, Beta Chain, Quant, S: 181 m[IU]/mL — ABNORMAL HIGH (ref <5)

## 2023-08-14 LAB — HCG, SERUM, QUALITATIVE: Preg, Serum: POSITIVE — AB

## 2023-08-14 MED ORDER — MISOPROSTOL 200 MCG PO TABS
1000.0000 ug | ORAL_TABLET | Freq: Once | ORAL | Status: AC
  Administered 2023-08-14: 1000 ug via ORAL
  Filled 2023-08-14: qty 5

## 2023-08-14 MED ORDER — KETOROLAC TROMETHAMINE 30 MG/ML IJ SOLN
30.0000 mg | Freq: Once | INTRAMUSCULAR | Status: AC
  Administered 2023-08-14: 30 mg via INTRAVENOUS
  Filled 2023-08-14: qty 1

## 2023-08-14 MED ORDER — POTASSIUM CHLORIDE CRYS ER 20 MEQ PO TBCR
40.0000 meq | EXTENDED_RELEASE_TABLET | Freq: Once | ORAL | Status: AC
  Administered 2023-08-14: 40 meq via ORAL
  Filled 2023-08-14: qty 2

## 2023-08-14 MED ORDER — MORPHINE SULFATE (PF) 4 MG/ML IV SOLN
4.0000 mg | Freq: Once | INTRAVENOUS | Status: AC
  Administered 2023-08-14: 4 mg via INTRAVENOUS
  Filled 2023-08-14: qty 1

## 2023-08-14 MED ORDER — DOXYCYCLINE HYCLATE 100 MG PO TABS
100.0000 mg | ORAL_TABLET | Freq: Once | ORAL | Status: AC
  Administered 2023-08-14: 100 mg via ORAL
  Filled 2023-08-14: qty 1

## 2023-08-14 MED ORDER — FENTANYL CITRATE PF 50 MCG/ML IJ SOSY
50.0000 ug | PREFILLED_SYRINGE | Freq: Once | INTRAMUSCULAR | Status: AC
  Administered 2023-08-14: 50 ug via INTRAVENOUS
  Filled 2023-08-14: qty 1

## 2023-08-14 MED ORDER — OXYCODONE-ACETAMINOPHEN 5-325 MG PO TABS
1.0000 | ORAL_TABLET | Freq: Once | ORAL | Status: AC
  Administered 2023-08-14: 1 via ORAL
  Filled 2023-08-14: qty 1

## 2023-08-14 MED ORDER — VANCOMYCIN HCL 1250 MG/250ML IV SOLN
1250.0000 mg | INTRAVENOUS | Status: DC
  Administered 2023-08-15: 1250 mg via INTRAVENOUS
  Filled 2023-08-14 (×2): qty 250

## 2023-08-14 MED ORDER — VANCOMYCIN HCL 1750 MG/350ML IV SOLN
1750.0000 mg | Freq: Once | INTRAVENOUS | Status: AC
  Administered 2023-08-15: 1750 mg via INTRAVENOUS
  Filled 2023-08-14: qty 350

## 2023-08-14 NOTE — ED Provider Notes (Signed)
[place] EMERGENCY DEPARTMENT AT [facility] Provider Note   CSN: [id number] Arrival date & time: 08/14/23  1717     History  Chief Complaint  Patient presents with   Vaginal Bleeding   Breast Pain    [name] is a 37 y.o. female.  Patient to ED for evaluation of heavy vaginal bleeding since spontaneous miscarriage on 8/4 seen and treated at [facility] with misoprostol. She presents today with persistent bleeding reporting clotting, having to change a pad every 30 minutes. No syncope. She also reports a painful swelling to the right breast. No fever, ongoing nausea or vomiting. No drainage from the breast area. No nipple discharge or bleeding.   The history is provided by the patient. No language interpreter was used.  Vaginal Bleeding      Home Medications Prior to Admission medications   Medication Sig Start Date End Date Taking? Authorizing Provider  Cholecalciferol (VITAMIN D3) 75 MCG (3000 UT) TABS Take 3,000 Units by mouth daily.   Yes [provider]  melatonin 5 MG TABS Take 10 mg by mouth at bedtime as needed (for sleep).   Yes [provider]  misoprostol (CYTOTEC) 200 MCG tablet Take 2 tablets (400 mcg total) by mouth in the morning, at noon, and at bedtime for 3 days. Patient not taking: Reported on 08/14/2023 07/21/23 08/14/23  Wyn Forster, MD  ondansetron (ZOFRAN ODT) 8 MG disintegrating tablet Take 1 tablet (8 mg total) by mouth every 8 (eight) hours as needed for nausea or vomiting. Patient not taking: Reported on 08/14/2023 07/21/23   Wyn Forster, MD      Allergies    Patient has no known allergies.    Review of Systems   Review of Systems  Genitourinary:  Positive for vaginal bleeding.    Physical Exam Updated Vital Signs BP 137/69   Pulse 71   Temp 98.1 F (36.7 C) (Oral)   Resp 14   Ht 5\' 2"  (1.575 m)   Wt 76.2 kg   LMP 09/20/2022 (Within Weeks)   SpO2 100%   Breastfeeding Unknown   BMI 30.73  kg/m  Physical Exam Vitals and nursing note reviewed.  Constitutional:      Appearance: Normal appearance. She is obese.  Eyes:     Comments: No conjunctival pallor.  Cardiovascular:     Rate and Rhythm: Normal rate and regular rhythm.  Pulmonary:     Effort: Pulmonary effort is normal.  Abdominal:     General: There is no distension.     Palpations: Abdomen is soft.  Neurological:     Mental Status: She is alert.     ED Results / Procedures / Treatments   Labs (all labs ordered are listed, but only abnormal results are displayed) Labs Reviewed  HCG, SERUM, QUALITATIVE - Abnormal; Notable for the following components:      Result Value   Preg, Serum POSITIVE (*)    All other components within normal limits  CBC - Abnormal; Notable for the following components:   WBC 16.0 (*)    RBC 3.57 (*)    Hemoglobin 11.6 (*)    HCT 34.6 (*)    All other components within normal limits  HCG, QUANTITATIVE, PREGNANCY - Abnormal; Notable for the following components:   hCG, Beta Chain, Quant, S 181 (*)    All other components within normal limits  BASIC METABOLIC PANEL - Abnormal; Notable for the following components:   Potassium 3.3 (*)  All other components within normal limits  MRSA NEXT GEN BY PCR, NASAL  TYPE AND SCREEN   Results for orders placed or performed during the hospital encounter of 08/14/23 (from the past 24 hour(s))  hCG, serum, qualitative     Status: Abnormal   Collection Time: 08/14/23  6:30 PM  Result Value Ref Range   Preg, Serum POSITIVE (A) NEGATIVE  CBC     Status: Abnormal   Collection Time: 08/14/23  6:30 PM  Result Value Ref Range   WBC 16.0 (H) 4.0 - 10.5 K/uL   RBC 3.57 (L) 3.87 - 5.11 MIL/uL   Hemoglobin 11.6 (L) 12.0 - 15.0 g/dL   HCT 40.3 (L) 47.4 - 25.9 %   MCV 96.9 80.0 - 100.0 fL   MCH 32.5 26.0 - 34.0 pg   MCHC 33.5 30.0 - 36.0 g/dL   RDW 56.3 87.5 - 64.3 %   Platelets 325 150 - 400 K/uL   nRBC 0.0 0.0 - 0.2 %  Type and screen   [facility]     Status: None   Collection Time: 08/14/23  6:30 PM  Result Value Ref Range   ABO/RH(D) B POS    Antibody Screen NEG    Sample Expiration      08/17/2023,2359 Performed at [facility], [address], [place], [place] [address]   hCG, quantitative, pregnancy     Status: Abnormal   Collection Time: 08/14/23  6:30 PM  Result Value Ref Range   hCG, Beta Chain, Quant, S 181 (H) <5 mIU/mL  Basic metabolic panel     Status: Abnormal   Collection Time: 08/14/23  6:30 PM  Result Value Ref Range   Sodium 135 135 - 145 mmol/L   Potassium 3.3 (L) 3.5 - 5.1 mmol/L   Chloride 103 98 - 111 mmol/L   CO2 22 22 - 32 mmol/L   Glucose, Bld 90 70 - 99 mg/dL   BUN 8 6 - 20 mg/dL   Creatinine, Ser 8.84 0.44 - 1.00 mg/dL   Calcium 9.0 8.9 - 16.6 mg/dL   GFR, Estimated >06 >30 mL/min   Anion gap 10 5 - 15    EKG None  Radiology US OB Transvaginal  Result Date: 08/14/2023 CLINICAL DATA:  Persistent vaginal bleeding, previous failed pregnancy, evaluate for retained products of conception EXAM: TRANSVAGINAL OB ULTRASOUND TECHNIQUE: Transvaginal ultrasound was performed for complete evaluation of the gestation as well as the maternal uterus, adnexal regions, and pelvic cul-de-sac. COMPARISON:  07/21/2023 FINDINGS: Intrauterine gestational sac: Absent Maternal uterus/adnexae: Ovaries are not well visualized. The uterus shows no endometrial collection. No significant increased vascularity to suggest retained products of conception are noted. IMPRESSION: No definitive retained products of conception. Electronically Signed   By: Alcide Clever M.D.   On: 08/14/2023 20:55   US OB Transvaginal  Result Date: 08/14/2023 CLINICAL DATA:  Persistent vaginal bleeding, previous failed pregnancy, evaluate for retained products of conception EXAM: TRANSVAGINAL OB ULTRASOUND TECHNIQUE: Transvaginal ultrasound was performed for complete evaluation of the gestation as well as the  maternal uterus, adnexal regions, and pelvic cul-de-sac. COMPARISON:  07/21/2023 FINDINGS: Intrauterine gestational sac: Absent Maternal uterus/adnexae: Ovaries are not well visualized. The uterus shows no endometrial collection. No significant increased vascularity to suggest retained products of conception are noted. IMPRESSION: No definitive retained products of conception. Electronically Signed   By: Alcide Clever M.D.   On: 08/14/2023 20:55   US OB LESS THAN 14 WEEKS WITH OB TRANSVAGINAL  Result Date: 07/21/2023 CLINICAL DATA:  Vaginal bleeding and pelvic cramping. EXAM: OBSTETRIC <14 WK [place] AND TRANSVAGINAL OB US TECHNIQUE: Both transabdominal and transvaginal ultrasound examinations were performed for complete evaluation of the gestation as well as the maternal uterus, adnexal regions, and pelvic cul-de-sac. Transvaginal technique was performed to assess early pregnancy. COMPARISON:  None Available. FINDINGS: Intrauterine gestational sac: Single, located within the lower uterine segment Yolk sac:  Visualized. Embryo:  Visualized. Cardiac Activity: Not Visualized. Heart Rate: N/A  bpm CRL:  19.4 mm   8 w   3 d Subchorionic hemorrhage:  Large Maternal uterus/adnexae: A 1.6 cm x 1.5 cm x 1.5 cm heterogeneous uterine fibroid is seen. The right ovary is visualized and is normal in appearance. The left ovary is not visualized. No pelvic free fluid is seen. IMPRESSION: Large subchorionic hemorrhage with findings that meet definitive criteria for failed pregnancy. This follows SRU consensus guidelines: Diagnostic Criteria for Nonviable Pregnancy Early in the First Trimester. [facility] Mis J Med [phone]. Electronically Signed   By: Aram Candela M.D.   On: 07/21/2023 20:08    Procedures Procedures    Medications Ordered in ED Medications  vancomycin (VANCOREADY) IVPB 1750 mg/350 mL (has no administration in time range)  Vancomycin (VANCOCIN) 1,250 mg in sodium chloride 0.9 % 250 mL IVPB (has no  administration in time range)  ketorolac (TORADOL) 30 MG/ML injection 30 mg (has no administration in time range)  oxyCODONE-acetaminophen (PERCOCET/ROXICET) 5-325 MG per tablet 1 tablet (1 tablet Oral Given 08/14/23 1842)  doxycycline (VIBRA-TABS) tablet 100 mg (100 mg Oral Given 08/14/23 1845)  fentaNYL (SUBLIMAZE) injection 50 mcg (50 mcg Intravenous Given 08/14/23 2030)  morphine (PF) 4 MG/ML injection 4 mg (4 mg Intravenous Given 08/14/23 2158)  misoprostol (CYTOTEC) tablet 1,000 mcg (1,000 mcg Oral Given 08/14/23 2240)  potassium chloride SA (KLOR-CON M) CR tablet 40 mEq (40 mEq Oral Given 08/14/23 2258)    ED Course/ Medical Decision Making/ A&P Clinical Course as of 08/14/23 2326  Wed Aug 14, 2023  1832 Patient to ED with persistent vaginal bleeding described as heavy with large clots since 8/4 when she had a spontaneous miscarriage. She was seen at [facility]. Chart reviewed. Previous quant HCG 8/4 was 1084. Ultrasound showed an incomplete miscarriage that, after shared decision making, was treated with misoprostol. The patient was given a dose in the MAU but did not obtain the medication to continue for the prescribed 3 days. Repeat US, repeat labs ordered in the ED as part of today's evaluation and are pending. Oxycodone provided for breast pain [SU]  2026 US done. Quant HCG significantly decreased at 181. Mild leukocytosis of 16. Oral abx ordered. Hemodynamically stable with HGB 11.6, normal VS. . [SU]  2131 Discussed with OB/GYN, Dr. Shawnie Pons who reviewed the [place] during call. Advises [SU]  2132 Discussed management of patient's breast abscess with Dr. Carolynne Edouard, gen surgery, who advised he would evaluate but to admit to medicine and likely aspirate with IR guided [place].  [SU]  2148 Hospitalist paged for admission. Discussed care with the patient who is agreeable to admission. Additional pain medication ordered.  [SU]    Clinical Course User Index [SU] Elpidio Anis, PA-C                                  Medical Decision Making Amount and/or Complexity of Data Reviewed Labs: ordered. Radiology: ordered.  Risk Prescription drug management. Decision  regarding hospitalization.           Final Clinical Impression(s) / ED Diagnoses Final diagnoses:  Breast abscess  Vaginal bleeding    Rx / DC Orders ED Discharge Orders     None         Danne Harbor 08/14/23 2326    Terald Sleeper, MD 08/15/23 1436

## 2023-08-14 NOTE — Assessment & Plan Note (Signed)
Replete with oral kcl 

## 2023-08-14 NOTE — Progress Notes (Signed)
Patient ID: [name], female   DOB: [birth year], 37 y.o.   MRN: [id number]   OB/GYN Telephone Consult  08/14/2023   [name] is a 37 y.o. [phone] who is currently pregnant presenting to [facility] ER I was called for a consult regarding the care of this patient by the PA caring for the patient.   The provider had the following clinical question: Bleeding after SAB S/p Cytotec for missed AB  The provider presented the following relevant clinical information: Reports heavy bleeding Hemodynamically stable Not seeing heavy bleeding today HCG is 181 Pelvic sono says no retained POC, though images reviewed and are not clear to me ,ay have fibroid Hgb is > 11 Breast abscess with elevated WBC  I performed a chart review on the patient and reviewed available documentation.  BP 132/72   Pulse 72   Temp 98.1 F (36.7 C) (Oral)   Resp 16   Ht 5\' 2"  (1.575 m)   Wt 76.2 kg   LMP 09/20/2022 (Within Weeks)   SpO2 100%   Breastfeeding Unknown   BMI 30.73 kg/m   Exam- performed by consulting provider   Recommendations:  -Cytotec 1000 mcg in ED -Consider touching base with gen surg about breast abscess w/ possible referral to [facility] for drainage -Bleeding and return precautions (return to MAU) -Recommended MD/APP provide the patient with a referral to the Center for [facility] (any office) for follow up in  2 weeks.   Thank you for this consult and if additional recommendations are needed please call [phone] for the OB/GYN attending on service at [facility].   I spent approximately 10 minutes directly consulting with the provider and verbally discussing this case. Additionally 10 minutes minutes was spent performing chart review and documentation.    Reva Bores, MD

## 2023-08-14 NOTE — ED Triage Notes (Signed)
Pt BIBA from home. Pt had miscarriage 8/4 and has been passing several golf ball sized clots a day, and for 3x days has had painful mass in R breast.  AOx4

## 2023-08-14 NOTE — Assessment & Plan Note (Signed)
EDP has consulted OB/GYN for pt's missed abortion. Orders written for cytotec.

## 2023-08-14 NOTE — Progress Notes (Signed)
Pharmacy Antibiotic Note  [name] is a 37 y.o. female admitted on 08/14/2023 with R breast cellulitis.  Pharmacy has been consulted for Vancomycin dosing.  Renal function at patient's baseline.   She had recent miscarriage.  Plan: Vancomycin 1250mg  IV q24h to target AUC 400-550. Estimated AUC on this regimen = 485. Monitor renal function and cx data   Height: 5\' 2"  (157.5 cm) Weight: 76.2 kg (168 lb) IBW/kg (Calculated) : 50.1  Temp (24hrs), Avg:98.2 F (36.8 C), Min:98.1 F (36.7 C), Max:98.3 F (36.8 C)  Recent Labs  Lab 08/14/23 1830  WBC 16.0*  CREATININE 0.79    Estimated Creatinine Clearance: 92 mL/min (by C-G formula based on SCr of 0.79 mg/dL).    No Known Allergies  Antimicrobials this admission: 8/28 Vancomycin >>   Dose adjustments this admission:  Microbiology results:  Thank you for allowing pharmacy to be a part of this patient's care.  Junita Push PharmD 08/14/2023 10:51 PM

## 2023-08-14 NOTE — Assessment & Plan Note (Signed)
Chronic Stable 

## 2023-08-14 NOTE — Subjective & Objective (Signed)
CC: right breast pain, vaginal bleeding HPI: 37 year old African-American female history of hypertension not taking medications, history of bipolar disorder (untreated), history of schizophrenia (untreated), presents to the ER today with a 2 to 3-day history of right breast pain.  She is also had spontaneous vaginal bleeding for several weeks now.  Patient was told that she was pregnant.  She has had a missed abortion.  GYN was consulted and ordered Cytotec.  Patient states that she has had a history of a left breast abscess in the past that needed drainage.  She states that this was MRSA.  She has tried hot compresses, hot showers, apply direct pressure to the area of her right breast near the areola without any drainage.  The area is tender and warm to touch.  She has not had any fevers.  On arrival to the ER, temp 90.3 heart rate 91 blood pressure 123/71 satting 100% on room air.  White count 16.0, hemoglobin 11.6, platelets of 325  Sodium 135, potassium 3.3, chloride 103, bicarb 22, BUN of 8, creatinine 0.79   Transvaginal ultrasound was negative for retained products of conception.  AP discussed the case with Dr. Carolynne Edouard with general surgery.  He recommend the patient be admitted to the hospital service.  He will consult.  [facility] hospitalist consulted.

## 2023-08-14 NOTE — ED Notes (Signed)
Elpidio Anis PA-C notified of patient's pain level at this time. PA at bedside to see patient.

## 2023-08-14 NOTE — Assessment & Plan Note (Signed)
BMI 30.73

## 2023-08-14 NOTE — Consult Note (Signed)
Reason for Consult: Breast pain Referring Physician: Dr. [name] is an 37 y.o. female.  HPI: The patient is a 37 year old black female who presents with right breast pain for the last couple weeks.  She denies any fevers or chills.  She feels as though the pain has been getting steadily worse.  She denies any drainage from the area.  She does smoke.  Past Medical History:  Diagnosis Date   Asthma    Chronic hypertension in pregnancy 05/12/2020   Diagnosed 5/27. Refused preeclampsia labs     Fetal echogenic intracardiac focus on prenatal ultrasound 02/13/2020   GBS (group B Streptococcus carrier), +RV culture, currently pregnant 05/23/2020   History of pre-eclampsia    Hx of gallstones    Hx successful VBAC (vaginal birth after cesarean), currently pregnant 2020/02/13   Neonatal death in prior pregnancy, currently pregnant 05/29/2020   Patient reports baby was born premature and passed away in the hospital     Normal labor 05/29/2020   Obesity    Supervision of other high risk pregnancy, antepartum 03/09/2020              Nursing Staff    Provider      Office Location     [facility]                Language      english    Anatomy US            Flu Vaccine          Genetic Screen     NIPS:   AFP:   First Screen:  Quad:          TDaP vaccine          Hgb A1C or   GTT    Early   Third trimester       Rhogam               LAB RESULTS                 Blood Type            Feeding Plan     breast / bottle    Antib   Trichomonal vaginitis during pregnancy in third trimester 02-13-20   Tx 05/11/2020      Past Surgical History:  Procedure Laterality Date   BREAST BIOPSY Left 07/31/2013   Procedure: IRRIGATION AND DEBRIDMENT LEFT BREAST ABSCESS;  Surgeon: Lodema Pilot, DO;  Location: [facility];  Service: General;  Laterality: Left;  IRRIGATION AND DEBRIDEMENT LEFT BREAST ABSCESS   CESAREAN SECTION     CESAREAN SECTION  06/09/2022   Procedure: CESAREAN SECTION;  Surgeon: Warden Fillers,  MD;  Location: [facility];  Service: Obstetrics;;    Family History  Problem Relation Age of Onset   Diabetes Father     Social History:  reports that she has been smoking cigarettes and cigars. She has never used smokeless tobacco. She reports that she does not currently use alcohol. She reports that she does not currently use drugs after having used the following drugs: Cocaine.  Allergies: No Known Allergies  Medications: I have reviewed the patient's current medications.  Results for orders placed or performed during the hospital encounter of 08/14/23 (from the past 48 hour(s))  hCG, serum, qualitative     Status: Abnormal   Collection Time: 08/14/23  6:30 PM  Result Value Ref Range   Preg, Serum POSITIVE (A)  NEGATIVE    Comment:        THE SENSITIVITY OF THIS METHODOLOGY IS >10 mIU/mL. Performed at [facility], [address], [place], [place] [address]   CBC     Status: Abnormal   Collection Time: 08/14/23  6:30 PM  Result Value Ref Range   WBC 16.0 (H) 4.0 - 10.5 K/uL   RBC 3.57 (L) 3.87 - 5.11 MIL/uL   Hemoglobin 11.6 (L) 12.0 - 15.0 g/dL   HCT 24.4 (L) 01.0 - 27.2 %   MCV 96.9 80.0 - 100.0 fL   MCH 32.5 26.0 - 34.0 pg   MCHC 33.5 30.0 - 36.0 g/dL   RDW 53.6 64.4 - 03.4 %   Platelets 325 150 - 400 K/uL   nRBC 0.0 0.0 - 0.2 %    Comment: Performed at [facility], [address] [place], [place] [address]  Type and screen [facility]     Status: None   Collection Time: 08/14/23  6:30 PM  Result Value Ref Range   ABO/RH(D) B POS    Antibody Screen NEG    Sample Expiration      08/17/2023,2359 Performed at [facility], [address], [place], [place] [address]   hCG, quantitative, pregnancy     Status: Abnormal   Collection Time: 08/14/23  6:30 PM  Result Value Ref Range   hCG, Beta Chain, Quant, S 181 (H) <5 mIU/mL    Comment:          GEST. AGE      CONC.  (mIU/mL)   <=1 WEEK         5 - 50     2 WEEKS       50 - 500     3 WEEKS       100 - 10,000     4 WEEKS     1,000 - 30,000     5 WEEKS     3,500 - 115,000   6-8 WEEKS     12,000 - 270,000    12 WEEKS     15,000 - 220,000        FEMALE AND NON-PREGNANT FEMALE:     LESS THAN 5 mIU/mL Performed at [facility], [address], [place], [place] [address]   Basic metabolic panel     Status: Abnormal   Collection Time: 08/14/23  6:30 PM  Result Value Ref Range   Sodium 135 135 - 145 mmol/L   Potassium 3.3 (L) 3.5 - 5.1 mmol/L   Chloride 103 98 - 111 mmol/L   CO2 22 22 - 32 mmol/L   Glucose, Bld 90 70 - 99 mg/dL    Comment: Glucose reference range applies only to samples taken after fasting for at least 8 hours.   BUN 8 6 - 20 mg/dL   Creatinine, Ser 2.95 0.44 - 1.00 mg/dL   Calcium 9.0 8.9 - 18.8 mg/dL   GFR, Estimated >41 >66 mL/min    Comment: (NOTE) Calculated using the CKD-EPI Creatinine Equation (2021)    Anion gap 10 5 - 15    Comment: Performed at [facility], [address] [place], [place] [address]    US OB Transvaginal  Result Date: 08/14/2023 CLINICAL DATA:  Persistent vaginal bleeding, previous failed pregnancy, evaluate for retained products of conception EXAM: TRANSVAGINAL OB ULTRASOUND TECHNIQUE: Transvaginal ultrasound was performed for complete evaluation of the gestation as well as  the maternal uterus, adnexal regions, and pelvic cul-de-sac. COMPARISON:  07/21/2023 FINDINGS: Intrauterine gestational sac: Absent Maternal uterus/adnexae: Ovaries are not well visualized. The uterus shows no endometrial collection. No significant increased vascularity to suggest retained products of conception are noted. IMPRESSION: No definitive retained products of conception. Electronically Signed   By: Alcide Clever M.D.   On: 08/14/2023 20:55    Review of Systems  Constitutional: Negative.   HENT: Negative.    Eyes: Negative.   Respiratory: Negative.     Cardiovascular: Negative.   Gastrointestinal: Negative.   Endocrine: Negative.   Genitourinary: Negative.   Musculoskeletal: Negative.   Skin:  Positive for color change.  Allergic/Immunologic: Negative.   Neurological: Negative.   Hematological: Negative.   Psychiatric/Behavioral: Negative.     Blood pressure 137/69, pulse 71, temperature 98.1 F (36.7 C), temperature source Oral, resp. rate 14, height 5\' 2"  (1.575 m), weight 76.2 kg, last menstrual period 09/20/2022, SpO2 100%, unknown if currently breastfeeding. Physical Exam Vitals reviewed.  Constitutional:      General: She is not in acute distress.    Appearance: Normal appearance.  HENT:     Head: Normocephalic and atraumatic.     Right Ear: External ear normal.     Left Ear: External ear normal.     Nose: Nose normal.     Mouth/Throat:     Mouth: Mucous membranes are dry.     Pharynx: Oropharynx is clear.  Eyes:     Extraocular Movements: Extraocular movements intact.     Conjunctiva/sclera: Conjunctivae normal.     Pupils: Pupils are equal, round, and reactive to light.  Cardiovascular:     Rate and Rhythm: Normal rate and regular rhythm.     Pulses: Normal pulses.     Heart sounds: Normal heart sounds.  Pulmonary:     Effort: Pulmonary effort is normal. No respiratory distress.     Breath sounds: Normal breath sounds.  Abdominal:     General: Abdomen is flat. Bowel sounds are normal.     Palpations: Abdomen is soft.     Tenderness: There is no abdominal tenderness.  Musculoskeletal:        General: No swelling or deformity. Normal range of motion.     Cervical back: Normal range of motion and neck supple.  Skin:    General: Skin is warm and dry.     Coloration: Skin is not jaundiced.     Comments: There is a red fluctuant area of the right areola laterally  Neurological:     General: No focal deficit present.     Mental Status: She is alert and oriented to person, place, and time.  Psychiatric:         Mood and Affect: Mood normal.        Behavior: Behavior normal.     Assessment/Plan: The patient appears to have a right breast abscess.  At this point I would recommend initial management with broad-spectrum antibiotic therapy and aspiration by interventional radiology.  If the infection does not resolve with aspiration and antibiotics then she might need an incision and drainage during this hospitalization.  We will follow her closely with you.  She may also need to stop smoking to completely get these breast abscesses to resolve.  Chevis Pretty III 08/14/2023, 10:53 PM

## 2023-08-14 NOTE — H&P (Signed)
History and Physical    [name]:[id number] DOB: 12-20-1985 DOA: 08/14/2023  DOS: the patient was seen and examined on 08/14/2023  PCP: Patient, No Pcp Per   Patient coming from: Home  I have personally briefly reviewed patient's old medical records in [facility]  CC: right breast pain, vaginal bleeding HPI: 37 year old African-American female history of hypertension not taking medications, history of bipolar disorder (untreated), history of schizophrenia (untreated), presents to the ER today with a 2 to 3-day history of right breast pain.  She is also had spontaneous vaginal bleeding for several weeks now.  Patient was told that she was pregnant.  She has had a missed abortion.  GYN was consulted and ordered Cytotec.  Patient states that she has had a history of a left breast abscess in the past that needed drainage.  She states that this was MRSA.  She has tried hot compresses, hot showers, apply direct pressure to the area of her right breast near the areola without any drainage.  The area is tender and warm to touch.  She has not had any fevers.  On arrival to the ER, temp 90.3 heart rate 91 blood pressure 123/71 satting 100% on room air.  White count 16.0, hemoglobin 11.6, platelets of 325  Sodium 135, potassium 3.3, chloride 103, bicarb 22, BUN of 8, creatinine 0.79   Transvaginal ultrasound was negative for retained products of conception.  AP discussed the case with Dr. Carolynne Edouard with general surgery.  He recommend the patient be admitted to the hospital service.  He will consult.  [facility] hospitalist consulted.    ED Course: WBC 16, trans-vag U/S negative for POC.  Review of Systems:  Review of Systems  Constitutional: Negative.   HENT: Negative.    Eyes: Negative.   Respiratory: Negative.    Cardiovascular: Negative.   Gastrointestinal: Negative.   Genitourinary:        +vaginal bleeding for 1 month  Musculoskeletal: Negative.   Skin:        Right  breast pain, swelling and erythema near areola.    Neurological: Negative.   Endo/Heme/Allergies: Negative.   Psychiatric/Behavioral: Negative.    All other systems reviewed and are negative.   Past Medical History:  Diagnosis Date   Asthma    Chronic hypertension in pregnancy 05/12/2020   Diagnosed 5/27. Refused preeclampsia labs     Fetal echogenic intracardiac focus on prenatal ultrasound 08-Mar-2020   GBS (group B Streptococcus carrier), +RV culture, currently pregnant 05/23/2020   History of pre-eclampsia    Hx of gallstones    Hx successful VBAC (vaginal birth after cesarean), currently pregnant 03/08/2020   Neonatal death in prior pregnancy, currently pregnant 05/29/2020   Patient reports baby was born premature and passed away in the hospital     Normal labor 05/29/2020   Obesity    Supervision of other high risk pregnancy, antepartum 03/09/2020              Nursing Staff    Provider      Office Location     [facility]                Language      english    Anatomy US            Flu Vaccine          Genetic Screen     NIPS:   AFP:   First Screen:  Quad:  TDaP vaccine          Hgb A1C or   GTT    Early   Third trimester       Rhogam               LAB RESULTS                 Blood Type            Feeding Plan     breast / bottle    Antib   Trichomonal vaginitis during pregnancy in third trimester 02/08/2020   Tx 05/11/2020      Past Surgical History:  Procedure Laterality Date   BREAST BIOPSY Left 07/31/2013   Procedure: IRRIGATION AND DEBRIDMENT LEFT BREAST ABSCESS;  Surgeon: Lodema Pilot, DO;  Location: [facility];  Service: General;  Laterality: Left;  IRRIGATION AND DEBRIDEMENT LEFT BREAST ABSCESS   CESAREAN SECTION     CESAREAN SECTION  06/09/2022   Procedure: CESAREAN SECTION;  Surgeon: Warden Fillers, MD;  Location: [facility];  Service: Obstetrics;;     reports that she has been smoking cigarettes and cigars. She has never used smokeless tobacco. She reports that she  does not currently use alcohol. She reports that she does not currently use drugs after having used the following drugs: Cocaine.  No Known Allergies  Family History  Problem Relation Age of Onset   Diabetes Father     Prior to Admission medications   Medication Sig Start Date End Date Taking? Authorizing Provider  Aspirin-Acetaminophen-Caffeine (GOODY HEADACHE PO) Take 1 packet by mouth as needed (for headaches or pain).   Yes [provider]  Cholecalciferol (VITAMIN D3) 75 MCG (3000 UT) TABS Take 3,000 Units by mouth daily.   Yes [provider]  melatonin 5 MG TABS Take 10 mg by mouth at bedtime as needed (for sleep).   Yes [provider]  NYQUIL SEVERE COLD/FLU 5-6.25-10-325 MG/15ML LIQD Take 5-15 mLs by mouth at bedtime as needed (for sleep).   Yes [provider]  albuterol (VENTOLIN HFA) 108 (90 Base) MCG/ACT inhaler Inhale 1-2 puffs into the lungs every 6 (six) hours as needed for wheezing or shortness of breath. Patient not taking: Reported on 08/14/2023 05/14/20   Gerrit Heck, CNM  furosemide (LASIX) 20 MG tablet Take 1 tablet (20 mg total) by mouth 2 (two) times daily. Patient not taking: Reported on 08/14/2023 06/12/22   Reva Bores, MD  guaiFENesin (MUCINEX) 600 MG 12 hr tablet Take 2 tablets (1,200 mg total) by mouth 2 (two) times daily. Patient not taking: Reported on 08/14/2023 06/12/22   Reva Bores, MD  misoprostol (CYTOTEC) 200 MCG tablet Take 2 tablets (400 mcg total) by mouth in the morning, at noon, and at bedtime for 3 days. Patient not taking: Reported on 08/14/2023 07/21/23 08/14/23  Wyn Forster, MD  naproxen (EC NAPROSYN) 500 MG EC tablet Take 1 tablet (500 mg total) by mouth 3 (three) times daily with meals. Patient not taking: Reported on 08/14/2023 07/21/23   Wyn Forster, MD  NIFEdipine (ADALAT CC) 30 MG 24 hr tablet Take 1 tablet (30 mg total) by mouth 2 (two) times daily. Patient not taking: Reported on 08/14/2023  06/12/22   Reva Bores, MD  ondansetron (ZOFRAN ODT) 8 MG disintegrating tablet Take 1 tablet (8 mg total) by mouth every 8 (eight) hours as needed for nausea or vomiting. Patient not taking: Reported on 08/14/2023 07/21/23   Wyn Forster,  MD  oxyCODONE-acetaminophen (PERCOCET/ROXICET) 5-325 MG tablet Take 1-2 tablets by mouth every 6 (six) hours as needed. Patient not taking: Reported on 08/14/2023 06/12/22   Reva Bores, MD  potassium chloride SA (KLOR-CON M) 20 MEQ tablet Take 1 tablet (20 mEq total) by mouth 2 (two) times daily for 5 days. Patient not taking: Reported on 08/14/2023 05/21/22 08/14/23  Rolm Bookbinder, CNM  Prenat-Fe Poly-Methfol-FA-DHA (VITAFOL ULTRA) 29-0.6-0.4-200 MG CAPS Take 1 capsule by mouth daily before breakfast. Patient not taking: Reported on 08/14/2023 04/21/20   Brock Bad, MD    Physical Exam: Vitals:   08/14/23 1800 08/14/23 2023 08/14/23 2030 08/14/23 2130  BP: 125/78 123/84 132/72 137/69  Pulse:  87 72 71  Resp:  18 16 14   Temp:  98.1 F (36.7 C)    TempSrc:  Oral    SpO2:  100% 100% 100%  Weight:      Height:        Physical Exam Vitals and nursing note reviewed.  Constitutional:      General: She is not in acute distress.    Appearance: She is obese. She is not ill-appearing, toxic-appearing or diaphoretic.  HENT:     Head: Normocephalic and atraumatic.     Nose: Nose normal.  Eyes:     General: No scleral icterus. Cardiovascular:     Rate and Rhythm: Normal rate and regular rhythm.     Pulses: Normal pulses.     Heart sounds: Normal heart sounds.  Pulmonary:     Effort: Pulmonary effort is normal. No respiratory distress.     Breath sounds: Normal breath sounds.  Abdominal:     General: Bowel sounds are normal. There is no distension.     Palpations: Abdomen is soft.     Tenderness: There is no abdominal tenderness.  Musculoskeletal:     Right lower leg: No edema.     Left lower leg: No edema.  Skin:    General: Skin is  warm and dry.     Capillary Refill: Capillary refill takes less than 2 seconds.     Comments: Right lateral areola with induration and slight fluctuance.  Mild streaking to the right lateral side of the breast.  Neurological:     General: No focal deficit present.     Mental Status: She is alert and oriented to person, place, and time.      Labs on Admission: I have personally reviewed following labs and imaging studies  CBC: Recent Labs  Lab 08/14/23 1830  WBC 16.0*  HGB 11.6*  HCT 34.6*  MCV 96.9  PLT 325   Basic Metabolic Panel: Recent Labs  Lab 08/14/23 1830  NA 135  K 3.3*  CL 103  CO2 22  GLUCOSE 90  BUN 8  CREATININE 0.79  CALCIUM 9.0   GFR: Estimated Creatinine Clearance: 92 mL/min (by C-G formula based on SCr of 0.79 mg/dL).  Urine analysis:    Component Value Date/Time   COLORURINE YELLOW 07/21/2023 1902   APPEARANCEUR HAZY (A) 07/21/2023 1902   LABSPEC 1.028 07/21/2023 1902   PHURINE 5.0 07/21/2023 1902   GLUCOSEU NEGATIVE 07/21/2023 1902   HGBUR MODERATE (A) 07/21/2023 1902   BILIRUBINUR NEGATIVE 07/21/2023 1902   KETONESUR 5 (A) 07/21/2023 1902   PROTEINUR 30 (A) 07/21/2023 1902   UROBILINOGEN 0.2 03/18/2015 1204   NITRITE NEGATIVE 07/21/2023 1902   LEUKOCYTESUR NEGATIVE 07/21/2023 1902    Radiological Exams on Admission: I have personally reviewed images US  OB Transvaginal  Result Date: 08/14/2023 CLINICAL DATA:  Persistent vaginal bleeding, previous failed pregnancy, evaluate for retained products of conception EXAM: TRANSVAGINAL OB ULTRASOUND TECHNIQUE: Transvaginal ultrasound was performed for complete evaluation of the gestation as well as the maternal uterus, adnexal regions, and pelvic cul-de-sac. COMPARISON:  07/21/2023 FINDINGS: Intrauterine gestational sac: Absent Maternal uterus/adnexae: Ovaries are not well visualized. The uterus shows no endometrial collection. No significant increased vascularity to suggest retained products of  conception are noted. IMPRESSION: No definitive retained products of conception. Electronically Signed   By: Alcide Clever M.D.   On: 08/14/2023 20:55    EKG: My personal interpretation of EKG shows: no EKG to review  Assessment/Plan Principal Problem:   Abscess of right breast Active Problems:   Obesity, Class III, BMI 40-49.9 (morbid obesity) (HCC)   Tobacco use   Missed abortion  Assessment and Plan: * Abscess of right breast Observation med/surg bed. Pt reports hx of MRSA abscess in her left breast in the past. Check MRSA screen. Start IV vanco. EDP has consulted Dr. Carolynne Edouard with general surgery to evaluate pt's abscess for I&D.  Missed abortion EDP has consulted OB/GYN for pt's missed abortion. Orders written for cytotec.  Tobacco use Chronic. Stable.  Obesity, Class III, BMI 40-49.9 (morbid obesity) (HCC) BMI 30.73   DVT prophylaxis: SCDs Code Status: Full Code Family Communication: no family at bedside  Disposition Plan: return home  Consults called: EDP has consulted OB/GYN and general surgery  Admission status: Observation, Med-Surg   Carollee Herter, DO [facility] 08/14/2023, 10:40 PM

## 2023-08-14 NOTE — Assessment & Plan Note (Signed)
Observation med/surg bed. Pt reports hx of MRSA abscess in her left breast in the past. Check MRSA screen. Start IV vanco. EDP has consulted Dr. Carolynne Edouard with general surgery to evaluate pt's abscess for I&D.

## 2023-08-15 ENCOUNTER — Observation Stay (HOSPITAL_COMMUNITY): Payer: BLUE CROSS/BLUE SHIELD

## 2023-08-15 DIAGNOSIS — N939 Abnormal uterine and vaginal bleeding, unspecified: Secondary | ICD-10-CM | POA: Diagnosis not present

## 2023-08-15 DIAGNOSIS — F17290 Nicotine dependence, other tobacco product, uncomplicated: Secondary | ICD-10-CM | POA: Diagnosis not present

## 2023-08-15 DIAGNOSIS — N611 Abscess of the breast and nipple: Secondary | ICD-10-CM | POA: Diagnosis present

## 2023-08-15 DIAGNOSIS — E876 Hypokalemia: Secondary | ICD-10-CM | POA: Diagnosis not present

## 2023-08-15 DIAGNOSIS — Z6841 Body Mass Index (BMI) 40.0 and over, adult: Secondary | ICD-10-CM | POA: Diagnosis not present

## 2023-08-15 LAB — MRSA NEXT GEN BY PCR, NASAL: MRSA by PCR Next Gen: NOT DETECTED

## 2023-08-15 MED ORDER — MELATONIN 5 MG PO TABS
10.0000 mg | ORAL_TABLET | Freq: Every evening | ORAL | Status: DC | PRN
  Administered 2023-08-15: 10 mg via ORAL
  Filled 2023-08-15: qty 2

## 2023-08-15 MED ORDER — IBUPROFEN 400 MG PO TABS
400.0000 mg | ORAL_TABLET | Freq: Four times a day (QID) | ORAL | Status: DC | PRN
  Administered 2023-08-15 (×3): 400 mg via ORAL
  Filled 2023-08-15: qty 2
  Filled 2023-08-15 (×2): qty 1

## 2023-08-15 MED ORDER — OXYCODONE-ACETAMINOPHEN 5-325 MG PO TABS
1.0000 | ORAL_TABLET | ORAL | Status: DC | PRN
  Administered 2023-08-15 – 2023-08-16 (×4): 1 via ORAL
  Filled 2023-08-15 (×4): qty 1

## 2023-08-15 MED ORDER — DIPHENHYDRAMINE HCL 50 MG/ML IJ SOLN
25.0000 mg | Freq: Once | INTRAMUSCULAR | Status: AC
  Administered 2023-08-15: 25 mg via INTRAVENOUS
  Filled 2023-08-15: qty 1

## 2023-08-15 MED ORDER — ONDANSETRON HCL 4 MG/2ML IJ SOLN
4.0000 mg | Freq: Four times a day (QID) | INTRAMUSCULAR | Status: DC | PRN
  Administered 2023-08-15: 4 mg via INTRAVENOUS
  Filled 2023-08-15: qty 2

## 2023-08-15 MED ORDER — LIDOCAINE HCL 1 % IJ SOLN
INTRAMUSCULAR | Status: AC
  Filled 2023-08-15: qty 20

## 2023-08-15 MED ORDER — PNEUMOCOCCAL 20-VAL CONJ VACC 0.5 ML IM SUSY
0.5000 mL | PREFILLED_SYRINGE | INTRAMUSCULAR | Status: DC
  Filled 2023-08-15: qty 0.5

## 2023-08-15 MED ORDER — ONDANSETRON HCL 4 MG PO TABS: 4.0000 mg | ORAL_TABLET | Freq: Four times a day (QID) | ORAL | Status: DC | PRN

## 2023-08-15 NOTE — ED Notes (Signed)
[name] ED tech told this RN that the patient c/o itching. This RN went in to see patient who stated "the back of her head was itching." Pt stated the itching started at 0130. IV Vancomycin had been started at 0002 and IV toradol given last at 2352. Pt denies itching anywhere else. Denies sob. O2 sat was 100%. No redness/hives noted on assessment.  Dr. Imogene Burn notified of patient's symptoms. Order for IV benadryl obtained. Medicated per [facility] with IV benadryl and IV zofran. Pt still has vancomycin running at this time.

## 2023-08-15 NOTE — ED Notes (Signed)
ED TO INPATIENT HANDOFF REPORT  ED Nurse Name and Phone #: Richarda Osmond Name/Age/Gender [name] 37 y.o. female Room/Bed: WA04/WA04  Code Status   Code Status: Full Code  Home/SNF/Other Home Patient oriented to: self, place, time, and situation Is this baseline? Yes   Triage Complete: Triage complete  Chief Complaint Abscess of right breast [N61.1]  Triage Note Pt BIBA from home. Pt had miscarriage 8/4 and has been passing several golf ball sized clots a day, and for 3x days has had painful mass in R breast.  AOx4   Allergies No Known Allergies  Level of Care/Admitting Diagnosis ED Disposition     ED Disposition  Admit   Condition  --   Comment  Hospital Area: [facility] [[id number]]  Level of Care: Med-Surg [16]  May place patient in observation at [facility] or [facility] if equivalent level of care is available:: No  Covid Evaluation: Asymptomatic - no recent exposure (last 10 days) testing not required  Diagnosis: Abscess of right breast [401027]  Admitting Physician: Imogene Burn ERIC [[id number]]  Attending Physician: Imogene Burn, ERIC [[id number]]          B Medical/Surgery History Past Medical History:  Diagnosis Date   Asthma    Chronic hypertension in pregnancy 05/12/2020   Diagnosed 5/27. Refused preeclampsia labs     Fetal echogenic intracardiac focus on prenatal ultrasound 2020-03-04   GBS (group B Streptococcus carrier), +RV culture, currently pregnant 05/23/2020   History of pre-eclampsia    Hx of gallstones    Hx successful VBAC (vaginal birth after cesarean), currently pregnant 03/04/20   Neonatal death in prior pregnancy, currently pregnant 05/29/2020   Patient reports baby was born premature and passed away in the hospital     Normal labor 05/29/2020   Obesity    Supervision of other high risk pregnancy, antepartum 03/09/2020              Nursing Staff    Provider      Office Location     [facility]                Language       english    Anatomy US            Flu Vaccine          Genetic Screen     NIPS:   AFP:   First Screen:  Quad:          TDaP vaccine          Hgb A1C or   GTT    Early   Third trimester       Rhogam               LAB RESULTS                 Blood Type            Feeding Plan     breast / bottle    Antib   Trichomonal vaginitis during pregnancy in third trimester 03/04/2020   Tx 05/11/2020     Past Surgical History:  Procedure Laterality Date   BREAST BIOPSY Left 07/31/2013   Procedure: IRRIGATION AND DEBRIDMENT LEFT BREAST ABSCESS;  Surgeon: Lodema Pilot, DO;  Location: [facility] ORS;  Service: General;  Laterality: Left;  IRRIGATION AND DEBRIDEMENT LEFT BREAST ABSCESS   CESAREAN SECTION     CESAREAN SECTION  06/09/2022   Procedure: CESAREAN SECTION;  Surgeon: Donavan Foil,  Regis Bill, MD;  Location: [facility] LD ORS;  Service: Obstetrics;;     A IV Location/Drains/Wounds Patient Lines/Drains/Airways Status     Active Line/Drains/Airways     Name Placement date Placement time Site Days   Peripheral IV 08/14/23 22 G Posterior;Right Hand 08/14/23  1830  Hand  1            Intake/Output Last 24 hours  Intake/Output Summary (Last 24 hours) at 08/15/2023 0948 Last data filed at 08/15/2023 0244 Gross per 24 hour  Intake 350 ml  Output --  Net 350 ml    Labs/Imaging Results for orders placed or performed during the hospital encounter of 08/14/23 (from the past 48 hour(s))  hCG, serum, qualitative     Status: Abnormal   Collection Time: 08/14/23  6:30 PM  Result Value Ref Range   Preg, Serum POSITIVE (A) NEGATIVE    Comment:        THE SENSITIVITY OF THIS METHODOLOGY IS >10 mIU/mL. Performed at [facility], [address], [place], [place] [address]   CBC     Status: Abnormal   Collection Time: 08/14/23  6:30 PM  Result Value Ref Range   WBC 16.0 (H) 4.0 - 10.5 K/uL   RBC 3.57 (L) 3.87 - 5.11 MIL/uL   Hemoglobin 11.6 (L) 12.0 - 15.0 g/dL   HCT 09.3 (L) 23.5 - 57.3 %   MCV 96.9  80.0 - 100.0 fL   MCH 32.5 26.0 - 34.0 pg   MCHC 33.5 30.0 - 36.0 g/dL   RDW 22.0 25.4 - 27.0 %   Platelets 325 150 - 400 K/uL   nRBC 0.0 0.0 - 0.2 %    Comment: Performed at [facility], [address] [place], [place] [address]  Type and screen [facility]     Status: None   Collection Time: 08/14/23  6:30 PM  Result Value Ref Range   ABO/RH(D) B POS    Antibody Screen NEG    Sample Expiration      08/17/2023,2359 Performed at [facility], [address], [place], [place] [address]   hCG, quantitative, pregnancy     Status: Abnormal   Collection Time: 08/14/23  6:30 PM  Result Value Ref Range   hCG, Beta Chain, Quant, S 181 (H) <5 mIU/mL    Comment:          GEST. AGE      CONC.  (mIU/mL)   <=1 WEEK        5 - 50     2 WEEKS       50 - 500     3 WEEKS       100 - 10,000     4 WEEKS     1,000 - 30,000     5 WEEKS     3,500 - 115,000   6-8 WEEKS     12,000 - 270,000    12 WEEKS     15,000 - 220,000        FEMALE AND NON-PREGNANT FEMALE:     LESS THAN 5 mIU/mL Performed at [facility], [address], [place], [place] [address]   Basic metabolic panel     Status: Abnormal   Collection Time: 08/14/23  6:30 PM  Result Value Ref Range   Sodium 135 135 - 145 mmol/L   Potassium 3.3 (L) 3.5 - 5.1 mmol/L   Chloride 103 98 - 111 mmol/L  CO2 22 22 - 32 mmol/L   Glucose, Bld 90 70 - 99 mg/dL    Comment: Glucose reference range applies only to samples taken after fasting for at least 8 hours.   BUN 8 6 - 20 mg/dL   Creatinine, Ser 1.61 0.44 - 1.00 mg/dL   Calcium 9.0 8.9 - 09.6 mg/dL   GFR, Estimated >04 >54 mL/min    Comment: (NOTE) Calculated using the CKD-EPI Creatinine Equation (2021)    Anion gap 10 5 - 15    Comment: Performed at [facility], [address] [place], [place] [address]  MRSA Next Gen by PCR, Nasal     Status: None   Collection Time: 08/15/23 12:05 AM    Specimen: Nasal Mucosa; Nasal Swab  Result Value Ref Range   MRSA by PCR Next Gen NOT DETECTED NOT DETECTED    Comment: (NOTE) The GeneXpert MRSA Assay (FDA approved for NASAL specimens only), is one component of a comprehensive MRSA colonization surveillance program. It is not intended to diagnose MRSA infection nor to guide or monitor treatment for MRSA infections. Test performance is not FDA approved in patients less than 58 years old. Performed at [facility], [address] [place], [place] [address]    US OB Transvaginal  Result Date: 08/14/2023 CLINICAL DATA:  Persistent vaginal bleeding, previous failed pregnancy, evaluate for retained products of conception EXAM: TRANSVAGINAL OB ULTRASOUND TECHNIQUE: Transvaginal ultrasound was performed for complete evaluation of the gestation as well as the maternal uterus, adnexal regions, and pelvic cul-de-sac. COMPARISON:  07/21/2023 FINDINGS: Intrauterine gestational sac: Absent Maternal uterus/adnexae: Ovaries are not well visualized. The uterus shows no endometrial collection. No significant increased vascularity to suggest retained products of conception are noted. IMPRESSION: No definitive retained products of conception. Electronically Signed   By: Alcide Clever M.D.   On: 08/14/2023 20:55    Pending Labs Unresulted Labs (From admission, onward)     Start     Ordered   08/15/23 0929  Aerobic/Anaerobic Culture w Gram Stain (surgical/deep wound)  Once,   R        08/15/23 0928            Vitals/Pain Today's Vitals   08/15/23 0100 08/15/23 0130 08/15/23 0300 08/15/23 0352  BP: 119/64 (!) 106/53 (!) 90/53 108/82  Pulse: 65 64 (!) 58 75  Resp: 18 14 18 16   Temp:    98.7 F (37.1 C)  TempSrc:    Oral  SpO2: 100% 100% 100% 100%  Weight:      Height:      PainSc:        Isolation Precautions No active isolations  Medications Medications  melatonin tablet 10 mg (has no administration in time range)   ibuprofen (ADVIL) tablet 400 mg (400 mg Oral Given 08/15/23 0625)  ondansetron (ZOFRAN) tablet 4 mg ( Oral See Alternative 08/15/23 0154)    Or  ondansetron (ZOFRAN) injection 4 mg (4 mg Intravenous Given 08/15/23 0154)  vancomycin (VANCOREADY) IVPB 1250 mg/250 mL (has no administration in time range)  oxyCODONE-acetaminophen (PERCOCET/ROXICET) 5-325 MG per tablet 1 tablet (1 tablet Oral Given 08/14/23 1842)  doxycycline (VIBRA-TABS) tablet 100 mg (100 mg Oral Given 08/14/23 1845)  fentaNYL (SUBLIMAZE) injection 50 mcg (50 mcg Intravenous Given 08/14/23 2030)  morphine (PF) 4 MG/ML injection 4 mg (4 mg Intravenous Given 08/14/23 2158)  misoprostol (CYTOTEC) tablet 1,000 mcg (1,000 mcg Oral Given 08/14/23 2240)  potassium chloride SA (KLOR-CON M) CR tablet  40 mEq (40 mEq Oral Given 08/14/23 2258)  vancomycin (VANCOREADY) IVPB 1750 mg/350 mL (0 mg Intravenous Stopped 08/15/23 0244)  ketorolac (TORADOL) 30 MG/ML injection 30 mg (30 mg Intravenous Given 08/14/23 2352)  diphenhydrAMINE (BENADRYL) injection 25 mg (25 mg Intravenous Given 08/15/23 0154)    Mobility walks     Focused Assessments     R Recommendations: See Admitting Provider Note  Report given to:   Additional Notes:

## 2023-08-15 NOTE — Procedures (Signed)
Interventional Radiology Procedure:   Indications: Right breast abscess  Procedure: US guided breast aspiration  Findings: Heterogenous fluid collection under lateral aspect of right breast areola.  Aspirated 7 ml of purulent fluid.  Collection was decompressed after aspiration.  Fluid sent for culture.   Complications: None     EBL: Minimal  Plan: Antibiotics and follow up with surgery / breast clinic  Dianely Krehbiel R. Lowella Dandy, MD  Pager: [phone]

## 2023-08-15 NOTE — Progress Notes (Addendum)
PROGRESS NOTE    [name]  WUJ:[id number] DOB: Apr 25, 1986 DOA: 08/14/2023 PCP: Patient, No Pcp Per   Brief Narrative:  This 37 years old African-American female with PMH significant of hypertension, not taking any medications, history of bipolar (untreated), history of schizophrenia(untreated) presented in the ED with complaints of right breast pain for 2 to 3 days.  Patient reports having spontaneous vaginal bleeding for several weeks now. She was told that she is pregnant. She has had missed abortion.  GYN was consulted and ordered Cytotec. Patient reports history of left breast abscess in the past that needed drainage.  She states that it was  MRSA.  She has tried hot compresses, hot shower, applied direct pressure to the area of her right breast without any drainage.  The area is tender and warm to touch.  She denies any fevers. Transvaginal ultrasound was negative for retained products of conception.  Patient was admitted for further evaluation,  General Surgery is consulted.  Assessment & Plan:   Principal Problem:   Abscess of right breast Active Problems:   Obesity, Class III, BMI 40-49.9 (morbid obesity) (HCC)   Tobacco use   Missed abortion   Hypokalemia  Abscess of the right breast: Patient presented with pain, redness and swelling in the right breast for 2 to 3 days. She denies any trauma, injury to the breast. Patient reports history of MRSA abscess in the left breast in the past. MRSA screen is negative. Empirically started on antibiotics IV vancomycin. General surgery is consulted recommended IV antibiotics and ultrasound-guided aspiration. Patient underwent ultrasound-guided aspiration follow-up cultures. Continue adequate pain control.  Missed abortion: EDP has consulted OB/GYN for missed abortion. GYN has recommended Cytotec.  Tobacco use: Chronic, stable.  Counseling completed.  Obesity: Diet and exercise discussed in detail.   DVT prophylaxis:  Lovenox Code Status: Full code Family Communication: No family at bedside. Disposition Plan:   Status is: Observation The patient remains OBS appropriate and will d/c before 2 midnights.   Admitted for right breast abscess,  underwent ultrasound-guided aspiration.   Consultants:  General Surgery Ob/GYN  Procedures:  Transvaginal US  Antimicrobials:  Anti-infectives (From admission, onward)    Start     Dose/Rate Route Frequency Ordered Stop   08/15/23 2200  vancomycin (VANCOREADY) IVPB 1250 mg/250 mL        1,250 mg 166.7 mL/hr over 90 Minutes Intravenous Every 24 hours 08/14/23 2313     08/14/23 2315  vancomycin (VANCOREADY) IVPB 1750 mg/350 mL        1,750 mg 175 mL/hr over 120 Minutes Intravenous  Once 08/14/23 2300 08/15/23 0244   08/14/23 1845  doxycycline (VIBRA-TABS) tablet 100 mg        100 mg Oral  Once 08/14/23 1840 08/14/23 1845      Subjective: Patient was seen and examined at bedside.  Overnight events noted. Patient reports having a lot of pain.  She underwent ultrasound-guided aspiration of the right breast abscess.  Objective: Vitals:   08/15/23 0300 08/15/23 0352 08/15/23 1020 08/15/23 1225  BP: (!) 90/53 108/82 128/74 91/67  Pulse: (!) 58 75 69 61  Resp: 18 16 18 18   Temp:  98.7 F (37.1 C) 98.4 F (36.9 C) 97.6 F (36.4 C)  TempSrc:  Oral Oral Oral  SpO2: 100% 100% 100% 100%  Weight:      Height:        Intake/Output Summary (Last 24 hours) at 08/15/2023 1427 Last data filed at 08/15/2023 0244 Gross  per 24 hour  Intake 350 ml  Output --  Net 350 ml   Filed Weights   08/14/23 1743  Weight: 76.2 kg    Examination:  General exam: Appears calm and comfortable, in a lot of pain. Respiratory system: Clear to auscultation. Respiratory effort normal. RR 14. Breast: Right Breast tender, swollen and erythematous. Cardiovascular system: S1 & S2 heard, RRR. No JVD, murmurs, rubs, gallops or clicks. No pedal edema. Gastrointestinal system:  Abdomen is nondistended, soft and nontender. Normal bowel sounds heard. Central nervous system: Alert and oriented X 3. No focal neurological deficits. Extremities: No edema, no cyanosis, no clubbing Skin: No rashes, lesions or ulcers Psychiatry: Judgement and insight appear normal. Mood & affect appropriate.   Data Reviewed: I have personally reviewed following labs and imaging studies  CBC: Recent Labs  Lab 08/14/23 1830  WBC 16.0*  HGB 11.6*  HCT 34.6*  MCV 96.9  PLT 325   Basic Metabolic Panel: Recent Labs  Lab 08/14/23 1830  NA 135  K 3.3*  CL 103  CO2 22  GLUCOSE 90  BUN 8  CREATININE 0.79  CALCIUM 9.0   GFR: Estimated Creatinine Clearance: 92 mL/min (by C-G formula based on SCr of 0.79 mg/dL). Liver Function Tests: No results for input(s): "AST", "ALT", "ALKPHOS", "BILITOT", "PROT", "ALBUMIN" in the last 168 hours. No results for input(s): "LIPASE", "AMYLASE" in the last 168 hours. No results for input(s): "AMMONIA" in the last 168 hours. Coagulation Profile: No results for input(s): "INR", "PROTIME" in the last 168 hours. Cardiac Enzymes: No results for input(s): "CKTOTAL", "CKMB", "CKMBINDEX", "TROPONINI" in the last 168 hours. BNP (last 3 results) No results for input(s): "PROBNP" in the last 8760 hours. HbA1C: No results for input(s): "HGBA1C" in the last 72 hours. CBG: No results for input(s): "GLUCAP" in the last 168 hours. Lipid Profile: No results for input(s): "CHOL", "HDL", "LDLCALC", "TRIG", "CHOLHDL", "LDLDIRECT" in the last 72 hours. Thyroid Function Tests: No results for input(s): "TSH", "T4TOTAL", "FREET4", "T3FREE", "THYROIDAB" in the last 72 hours. Anemia Panel: No results for input(s): "VITAMINB12", "FOLATE", "FERRITIN", "TIBC", "IRON", "RETICCTPCT" in the last 72 hours. Sepsis Labs: No results for input(s): "PROCALCITON", "LATICACIDVEN" in the last 168 hours.  Recent Results (from the past 240 hour(s))  MRSA Next Gen by PCR, Nasal      Status: None   Collection Time: 08/15/23 12:05 AM   Specimen: Nasal Mucosa; Nasal Swab  Result Value Ref Range Status   MRSA by PCR Next Gen NOT DETECTED NOT DETECTED Final    Comment: (NOTE) The GeneXpert MRSA Assay (FDA approved for NASAL specimens only), is one component of a comprehensive MRSA colonization surveillance program. It is not intended to diagnose MRSA infection nor to guide or monitor treatment for MRSA infections. Test performance is not FDA approved in patients less than 45 years old. Performed at [facility], [address] [place], [place] [address]          Radiology Studies: US BREAST ASPIRATION RIGHT  Result Date: 08/15/2023 INDICATION: 37 year old with a right breast abscess. EXAM: Ultrasound-guided aspiration of right breast abscess MEDICATIONS: 1% lidocaine for local anesthetic ANESTHESIA/SEDATION: None COMPLICATIONS: None immediate. PROCEDURE: Informed written consent was obtained from the patient after a thorough discussion of the procedural risks, benefits and alternatives. All questions were addressed and timeout was performed prior to the initiation of the procedure. Right breast was evaluated with ultrasound. Ultrasound demonstrated a complex fluid collection just beneath the lateral aspect of the areola.  Right breast was prepped with chlorhexidine and sterile field was created. The skin lateral to the areola was anesthetized using 1% lidocaine. Using ultrasound guidance, an 18 gauge spinal needle was directed into the fluid collection. 7 mL purulent fluid was aspirated. The collection was decompressed at the end of the procedure. Fluid was sent for culture. Bandage placed over the puncture site. FINDINGS: Complex fluid collection just below the right breast areola. This collection measured 3.6 x 1.3 x 3.0 cm. 7 mL of purulent fluid was aspirated. The abscess was decompressed at the end of the procedure. IMPRESSION: Ultrasound-guided  aspiration of the right breast abscess. Electronically Signed   By: Richarda Overlie M.D.   On: 08/15/2023 12:34   US OB Transvaginal  Result Date: 08/14/2023 CLINICAL DATA:  Persistent vaginal bleeding, previous failed pregnancy, evaluate for retained products of conception EXAM: TRANSVAGINAL OB ULTRASOUND TECHNIQUE: Transvaginal ultrasound was performed for complete evaluation of the gestation as well as the maternal uterus, adnexal regions, and pelvic cul-de-sac. COMPARISON:  07/21/2023 FINDINGS: Intrauterine gestational sac: Absent Maternal uterus/adnexae: Ovaries are not well visualized. The uterus shows no endometrial collection. No significant increased vascularity to suggest retained products of conception are noted. IMPRESSION: No definitive retained products of conception. Electronically Signed   By: Alcide Clever M.D.   On: 08/14/2023 20:55    Scheduled Meds:  [START ON 08/16/2023] pneumococcal 20-valent conjugate vaccine  0.5 mL Intramuscular Tomorrow-1000   Continuous Infusions:  vancomycin       LOS: 0 days    Time spent: 35 mins    Willeen Niece, MD [facility]   If 7PM-7AM, please contact night-coverage

## 2023-08-15 NOTE — ED Notes (Signed)
Pt verbalized her itching and nausea has resolved.

## 2023-08-15 NOTE — ED Notes (Signed)
Pt given sandwich and ginger ale.

## 2023-08-15 NOTE — Progress Notes (Signed)
Abscess of right breast  Subjective: No change since last night, awaiting a bed upstairs  Objective: Vital signs in last 24 hours: Temp:  [98.1 F (36.7 C)-98.7 F (37.1 C)] 98.7 F (37.1 C) (08/29 0352) Pulse Rate:  [58-91] 75 (08/29 0352) Resp:  [14-20] 16 (08/29 0352) BP: (90-137)/(51-86) 108/82 (08/29 0352) SpO2:  [98 %-100 %] 100 % (08/29 0352) Weight:  [76.2 kg] 76.2 kg (08/28 1743)    Intake/Output from previous day: 08/28 0701 - 08/29 0700 In: 350 [IV Piggyback:350] Out: -  Intake/Output this shift: No intake/output data recorded.  General appearance: alert and cooperative Breasts: erythematous, fluctuant area lateral to R areola   Lab Results:  Results for orders placed or performed during the hospital encounter of 08/14/23 (from the past 24 hour(s))  hCG, serum, qualitative     Status: Abnormal   Collection Time: 08/14/23  6:30 PM  Result Value Ref Range   Preg, Serum POSITIVE (A) NEGATIVE  CBC     Status: Abnormal   Collection Time: 08/14/23  6:30 PM  Result Value Ref Range   WBC 16.0 (H) 4.0 - 10.5 K/uL   RBC 3.57 (L) 3.87 - 5.11 MIL/uL   Hemoglobin 11.6 (L) 12.0 - 15.0 g/dL   HCT 29.5 (L) 62.1 - 30.8 %   MCV 96.9 80.0 - 100.0 fL   MCH 32.5 26.0 - 34.0 pg   MCHC 33.5 30.0 - 36.0 g/dL   RDW 65.7 84.6 - 96.2 %   Platelets 325 150 - 400 K/uL   nRBC 0.0 0.0 - 0.2 %  [facility]     Status: None   Collection Time: 08/14/23  6:30 PM  Result Value Ref Range   ABO/RH(D) B POS    Antibody Screen NEG    Sample Expiration      08/17/2023,2359 Performed at [facility], [address], [place], [place] [address]   hCG, quantitative, pregnancy     Status: Abnormal   Collection Time: 08/14/23  6:30 PM  Result Value Ref Range   hCG, Beta Chain, Quant, S 181 (H) <5 mIU/mL  Basic metabolic panel     Status: Abnormal   Collection Time: 08/14/23  6:30 PM  Result Value Ref Range   Sodium 135 135 - 145 mmol/L    Potassium 3.3 (L) 3.5 - 5.1 mmol/L   Chloride 103 98 - 111 mmol/L   CO2 22 22 - 32 mmol/L   Glucose, Bld 90 70 - 99 mg/dL   BUN 8 6 - 20 mg/dL   Creatinine, Ser 1.32 0.44 - 1.00 mg/dL   Calcium 9.0 8.9 - 44.0 mg/dL   GFR, Estimated >10 >27 mL/min   Anion gap 10 5 - 15  MRSA Next Gen by PCR, Nasal     Status: None   Collection Time: 08/15/23 12:05 AM   Specimen: Nasal Mucosa; Nasal Swab  Result Value Ref Range   MRSA by PCR Next Gen NOT DETECTED NOT DETECTED     Studies/Results Radiology     MEDS, Scheduled    Assessment: Abscess of right breast   Plan: IV abx IR for US guided aspiration   LOS: 0 days    Vanita Panda, MD [facility]  Patient's medical decision making was straightforward (25 mins met or exceeded with patient care and documentation).   08/15/2023 8:50 AM

## 2023-08-16 ENCOUNTER — Other Ambulatory Visit (HOSPITAL_COMMUNITY): Payer: Self-pay

## 2023-08-16 DIAGNOSIS — N611 Abscess of the breast and nipple: Secondary | ICD-10-CM | POA: Diagnosis not present

## 2023-08-16 LAB — CBC
HCT: 28.4 % — ABNORMAL LOW (ref 36.0–46.0)
Hemoglobin: 9.3 g/dL — ABNORMAL LOW (ref 12.0–15.0)
MCH: 31.4 pg (ref 26.0–34.0)
MCHC: 32.7 g/dL (ref 30.0–36.0)
MCV: 95.9 fL (ref 80.0–100.0)
Platelets: 274 10*3/uL (ref 150–400)
RBC: 2.96 MIL/uL — ABNORMAL LOW (ref 3.87–5.11)
RDW: 13.9 % (ref 11.5–15.5)
WBC: 9.2 10*3/uL (ref 4.0–10.5)
nRBC: 0 % (ref 0.0–0.2)

## 2023-08-16 LAB — BASIC METABOLIC PANEL
Anion gap: 9 (ref 5–15)
BUN: 13 mg/dL (ref 6–20)
CO2: 22 mmol/L (ref 22–32)
Calcium: 8.3 mg/dL — ABNORMAL LOW (ref 8.9–10.3)
Chloride: 105 mmol/L (ref 98–111)
Creatinine, Ser: 0.76 mg/dL (ref 0.44–1.00)
GFR, Estimated: >60 mL/min (ref >60)
Glucose, Bld: 111 mg/dL — ABNORMAL HIGH (ref 70–99)
Potassium: 3.5 mmol/L (ref 3.5–5.1)
Sodium: 136 mmol/L (ref 135–145)

## 2023-08-16 LAB — MAGNESIUM: Magnesium: 1.9 mg/dL (ref 1.7–2.4)

## 2023-08-16 LAB — PHOSPHORUS: Phosphorus: 5 mg/dL — ABNORMAL HIGH (ref 2.5–4.6)

## 2023-08-16 MED ORDER — DIPHENHYDRAMINE HCL 25 MG PO CAPS
25.0000 mg | ORAL_CAPSULE | Freq: Once | ORAL | Status: AC
  Administered 2023-08-16: 25 mg via ORAL
  Filled 2023-08-16: qty 1

## 2023-08-16 MED ORDER — LINEZOLID 600 MG PO TABS
600.0000 mg | ORAL_TABLET | Freq: Two times a day (BID) | ORAL | 0 refills | Status: AC
  Filled 2023-08-16: qty 20, 10d supply, fill #0

## 2023-08-16 MED ORDER — AMOXICILLIN-POT CLAVULANATE 875-125 MG PO TABS
1.0000 | ORAL_TABLET | Freq: Two times a day (BID) | ORAL | 0 refills | Status: AC
Start: 2023-08-16 — End: 2023-08-26
  Filled 2023-08-16: qty 20, 10d supply, fill #0

## 2023-08-16 MED ORDER — OXYCODONE-ACETAMINOPHEN 5-325 MG PO TABS
1.0000 | ORAL_TABLET | Freq: Three times a day (TID) | ORAL | 0 refills | Status: DC | PRN
  Filled 2023-08-16: qty 12, 4d supply, fill #0

## 2023-08-16 NOTE — Progress Notes (Deleted)
PROGRESS NOTE  [name] ZOX:[id number] DOB: 07/02/1986 DOA: 08/14/2023 PCP: Patient, No Pcp Per   LOS: 0 days   Brief Narrative / Interim history: 37 years old African-American female with PMH significant of hypertension, not taking any medications, history of bipolar (untreated), history of schizophrenia(untreated) presented in the ED with complaints of right breast pain for 2 to 3 days.  Patient reports having spontaneous vaginal bleeding for several weeks now. She was told that she is pregnant. She has had missed abortion.  GYN was consulted and ordered Cytotec. Patient reports history of left breast abscess in the past that needed drainage.  She states that it was  MRSA.  She has tried hot compresses, hot shower, applied direct pressure to the area of her right breast without any drainage.  The area is tender and warm to touch.  She denies any fevers.  Subjective / 24h Interval events: He is doing well today, asking about discharge  Assesement and Plan: Principal Problem:   Abscess of right breast Active Problems:   Obesity, Class III, BMI 40-49.9 (morbid obesity) (HCC)   Tobacco use   Missed abortion   Hypokalemia   Principal problem Right breast abscess -general surgery consulted, appreciate input.  IR was also consulted and status post ultrasound-guided aspiration.  Currently on vancomycin, preliminary culture data showing GPC's, GNR's as well as gram-positive rods.  Await speciation  Active problems Missed abortion-EDP consulted OB/GYN, status post Cytotec  Tobacco use-counseled for cessation  Obesity, class I-borderline, BMI 30.7.  She would benefit from weight loss  Scheduled Meds: Continuous Infusions:  vancomycin 1,250 mg (08/15/23 2118)   PRN Meds:.ibuprofen, melatonin, ondansetron **OR** ondansetron (ZOFRAN) IV, oxyCODONE-acetaminophen  Current Outpatient Medications  Medication Instructions   melatonin 10 mg, Oral, At bedtime PRN   misoprostol  (CYTOTEC) 400 mcg, Oral, 3 times daily   ondansetron (ZOFRAN ODT) 8 mg, Oral, Every 8 hours PRN   Vitamin D3 3,000 Units, Oral, Daily    Diet Orders (From admission, onward)     Start     Ordered   08/14/23 2228  Diet regular Room service appropriate? Yes; Fluid consistency: Thin  Diet effective now       Question Answer Comment  Room service appropriate? Yes   Fluid consistency: Thin      08/14/23 2228            DVT prophylaxis: SCDs Start: 08/15/23 0539   Lab Results  Component Value Date   PLT 274 08/16/2023      Code Status: Full Code  Family Communication: no family at bedside   Status is: Observation  The patient will require care spanning > 2 midnights and should be moved to inpatient because: awaiting micro speciation  Level of care: Med-Surg  Consultants:  General surgery   Objective: Vitals:   08/15/23 1225 08/15/23 2054 08/16/23 0522 08/16/23 0738  BP: 91/67 (!) 103/59 103/71 (!) 96/59  Pulse: 61 72 (!) 57 (!) 51  Resp: 18   16  Temp: 97.6 F (36.4 C) (!) 97.5 F (36.4 C) 97.6 F (36.4 C) (!) 97.5 F (36.4 C)  TempSrc: Oral Oral Oral Oral  SpO2: 100% 100% 100% 100%  Weight:      Height:        Intake/Output Summary (Last 24 hours) at 08/16/2023 1010 Last data filed at 08/15/2023 2118 Gross per 24 hour  Intake 730 ml  Output --  Net 730 ml   Wt Readings from Last 3 Encounters:  08/14/23 76.2 kg  07/21/23 85.9 kg  11/13/22 82.6 kg    Examination:  Constitutional: NAD Eyes: no scleral icterus ENMT: Mucous membranes are moist.  Neck: normal, supple Respiratory: clear to auscultation bilaterally, no wheezing, no crackles. Normal respiratory effort. No accessory muscle use.  Cardiovascular: Regular rate and rhythm, no murmurs / rubs / gallops. No LE edema.  Abdomen: non distended, no tenderness. Bowel sounds positive.  Musculoskeletal: no clubbing / cyanosis.   Data Reviewed: I have independently reviewed following labs and  imaging studies   CBC Recent Labs  Lab 08/14/23 1830 08/16/23 0332  WBC 16.0* 9.2  HGB 11.6* 9.3*  HCT 34.6* 28.4*  PLT 325 274  MCV 96.9 95.9  MCH 32.5 31.4  MCHC 33.5 32.7  RDW 13.7 13.9    Recent Labs  Lab 08/14/23 1830 08/16/23 0332  NA 135 136  K 3.3* 3.5  CL 103 105  CO2 22 22  GLUCOSE 90 111*  BUN 8 13  CREATININE 0.79 0.76  CALCIUM 9.0 8.3*  MG  --  1.9    ------------------------------------------------------------------------------------------------------------------ No results for input(s): "CHOL", "HDL", "LDLCALC", "TRIG", "CHOLHDL", "LDLDIRECT" in the last 72 hours.  No results found for: "HGBA1C" ------------------------------------------------------------------------------------------------------------------ No results for input(s): "TSH", "T4TOTAL", "T3FREE", "THYROIDAB" in the last 72 hours.  Invalid input(s): "FREET3"  Cardiac Enzymes No results for input(s): "CKMB", "TROPONINI", "MYOGLOBIN" in the last 168 hours.  Invalid input(s): "CK" ------------------------------------------------------------------------------------------------------------------ No results found for: "BNP"  CBG: No results for input(s): "GLUCAP" in the last 168 hours.  Recent Results (from the past 240 hour(s))  MRSA Next Gen by PCR, Nasal     Status: None   Collection Time: 08/15/23 12:05 AM   Specimen: Nasal Mucosa; Nasal Swab  Result Value Ref Range Status   MRSA by PCR Next Gen NOT DETECTED NOT DETECTED Final    Comment: (NOTE) The GeneXpert MRSA Assay (FDA approved for NASAL specimens only), is one component of a comprehensive MRSA colonization surveillance program. It is not intended to diagnose MRSA infection nor to guide or monitor treatment for MRSA infections. Test performance is not FDA approved in patients less than 47 years old. Performed at [facility], [address], [place], [place] [address]   Aerobic/Anaerobic Culture w  Gram Stain (surgical/deep wound)     Status: None (Preliminary result)   Collection Time: 08/15/23  9:29 AM   Specimen: Abscess  Result Value Ref Range Status   Specimen Description   Final    ABSCESS Performed at [facility], [address], [place], [place] [address]    Special Requests   Final    BREAST, RIGHT CENTRAL Performed at [facility], [address], [place], [place] [address]    Gram Stain   Final    ABUNDANT WBC PRESENT, PREDOMINANTLY PMN ABUNDANT GRAM POSITIVE COCCI IN PAIRS MODERATE GRAM NEGATIVE RODS FEW GRAM POSITIVE RODS    Culture   Final    NO GROWTH < 24 HOURS Performed at [facility], [address] [place], [place] [address]    Report Status PENDING  Incomplete     Radiology Studies: No results found.   Pamella Pert, MD, PhD [facility]  Between 7 am - 7 pm I am available, please contact me via Amion (for emergencies) or Securechat (non urgent messages)  Between 7 pm - 7 am I am not available, please contact night coverage MD/APP via Amion

## 2023-08-16 NOTE — Discharge Summary (Signed)
Physician Discharge Summary  [name] GEX:[id number] DOB: 06/17/1986 DOA: 08/14/2023  PCP: Patient, No Pcp Per  Admit date: 08/14/2023 Discharge date: 08/16/2023  Admitted From: home Disposition:  home  Recommendations for Outpatient Follow-up:  Follow up with PCP in 1-2 weeks Follow-up with surgical breast center in 1 week Please follow up on the following pending results: Final cultures  Home Health: none Equipment/Devices: none  Discharge Condition: stable CODE STATUS: Full code Diet Orders (From admission, onward)     Start     Ordered   08/14/23 2228  Diet regular Room service appropriate? Yes; Fluid consistency: Thin  Diet effective now       Question Answer Comment  Room service appropriate? Yes   Fluid consistency: Thin      08/14/23 2228            HPI: Per admitting MD, 37 year old African-American female history of hypertension not taking medications, history of bipolar disorder (untreated), history of schizophrenia (untreated), presents to the ER today with a 2 to 3-day history of right breast pain.  She is also had spontaneous vaginal bleeding for several weeks now.  Patient was told that she was pregnant.  She has had a missed abortion.  GYN was consulted and ordered Cytotec. Patient states that she has had a history of a left breast abscess in the past that needed drainage.  She states that this was MRSA.  She has tried hot compresses, hot showers, apply direct pressure to the area of her right breast near the areola without any drainage.  The area is tender and warm to touch.  She has not had any fevers.  Hospital Course / Discharge diagnoses: Principal Problem:   Abscess of right breast Active Problems:   Obesity, Class III, BMI 40-49.9 (morbid obesity) (HCC)   Tobacco use   Missed abortion   Hypokalemia   Principal problem Right breast abscess -general surgery consulted, appreciate input.  IR was also consulted and status post  ultrasound-guided aspiration. Preliminary culture data showing GPC's, GNR's as well as gram-positive rods.  Although I would have preferred to await speciation/sensitivities, patient very insistent that she goes home, surgery is okay with her discharge, I have also discussed with ID with recommendations for linezolid as well as Augmentin.  Will provide a 10-day course   Active problems Missed abortion-EDP consulted OB/GYN, status post Cytotec Tobacco use-counseled for cessation Obesity, class I-borderline, BMI 30.7.  She would benefit from weight loss  Sepsis ruled out   Discharge Instructions   Allergies as of 08/16/2023   No Known Allergies      Medication List     TAKE these medications    amoxicillin-clavulanate 875-125 MG tablet Commonly known as: AUGMENTIN Take 1 tablet by mouth 2 (two) times daily for 10 days.   linezolid 600 MG tablet Commonly known as: ZYVOX Take 1 tablet (600 mg total) by mouth 2 (two) times daily for 10 days.   melatonin 5 MG Tabs Take 10 mg by mouth at bedtime as needed (for sleep).   misoprostol 200 MCG tablet Commonly known as: Cytotec Take 2 tablets (400 mcg total) by mouth in the morning, at noon, and at bedtime for 3 days.   ondansetron 8 MG disintegrating tablet Commonly known as: Zofran ODT Take 1 tablet (8 mg total) by mouth every 8 (eight) hours as needed for nausea or vomiting.   oxyCODONE-acetaminophen 5-325 MG tablet Commonly known as: PERCOCET/ROXICET Take 1 tablet by mouth every 8 (eight)  hours as needed for severe pain.   Vitamin D3 75 MCG (3000 UT) Tabs Take 3,000 Units by mouth daily.        Follow-up Information     Griselda Miner, MD Follow up.   Specialty: General Surgery Why: As needed Contact information: [address] [place] [address] [phone]         Imaging, [facility] Follow up.   Specialty: Diagnostic Radiology Why: As needed Contact  information: [address] [place] [address] [phone]         Monna Fam, MD Follow up.   Specialty: Internal Medicine Why: September 04, 2023 at 1100 am Contact information: [address] [place] [address] [phone]                 Consultations: General surgery  IR  Procedures/Studies:  US BREAST ASPIRATION RIGHT  Result Date: 08/15/2023 INDICATION: 37 year old with a right breast abscess. EXAM: Ultrasound-guided aspiration of right breast abscess MEDICATIONS: 1% lidocaine for local anesthetic ANESTHESIA/SEDATION: None COMPLICATIONS: None immediate. PROCEDURE: Informed written consent was obtained from the patient after a thorough discussion of the procedural risks, benefits and alternatives. All questions were addressed and timeout was performed prior to the initiation of the procedure. Right breast was evaluated with ultrasound. Ultrasound demonstrated a complex fluid collection just beneath the lateral aspect of the areola. Right breast was prepped with chlorhexidine and sterile field was created. The skin lateral to the areola was anesthetized using 1% lidocaine. Using ultrasound guidance, an 18 gauge spinal needle was directed into the fluid collection. 7 mL purulent fluid was aspirated. The collection was decompressed at the end of the procedure. Fluid was sent for culture. Bandage placed over the puncture site. FINDINGS: Complex fluid collection just below the right breast areola. This collection measured 3.6 x 1.3 x 3.0 cm. 7 mL of purulent fluid was aspirated. The abscess was decompressed at the end of the procedure. IMPRESSION: Ultrasound-guided aspiration of the right breast abscess. Electronically Signed   By: Richarda Overlie M.D.   On: 08/15/2023 12:34   US OB Transvaginal  Result Date: 08/14/2023 CLINICAL DATA:  Persistent vaginal bleeding, previous failed pregnancy, evaluate for retained products of conception EXAM: TRANSVAGINAL OB  ULTRASOUND TECHNIQUE: Transvaginal ultrasound was performed for complete evaluation of the gestation as well as the maternal uterus, adnexal regions, and pelvic cul-de-sac. COMPARISON:  07/21/2023 FINDINGS: Intrauterine gestational sac: Absent Maternal uterus/adnexae: Ovaries are not well visualized. The uterus shows no endometrial collection. No significant increased vascularity to suggest retained products of conception are noted. IMPRESSION: No definitive retained products of conception. Electronically Signed   By: Alcide Clever M.D.   On: 08/14/2023 20:55   US OB LESS THAN 14 WEEKS WITH OB TRANSVAGINAL  Result Date: 07/21/2023 CLINICAL DATA:  Vaginal bleeding and pelvic cramping. EXAM: OBSTETRIC <14 WK [place] AND TRANSVAGINAL OB US TECHNIQUE: Both transabdominal and transvaginal ultrasound examinations were performed for complete evaluation of the gestation as well as the maternal uterus, adnexal regions, and pelvic cul-de-sac. Transvaginal technique was performed to assess early pregnancy. COMPARISON:  None Available. FINDINGS: Intrauterine gestational sac: Single, located within the lower uterine segment Yolk sac:  Visualized. Embryo:  Visualized. Cardiac Activity: Not Visualized. Heart Rate: N/A  bpm CRL:  19.4 mm   8 w   3 d Subchorionic hemorrhage:  Large Maternal uterus/adnexae: A 1.6 cm x 1.5 cm x 1.5 cm heterogeneous uterine fibroid is seen. The  right ovary is visualized and is normal in appearance. The left ovary is not visualized. No pelvic free fluid is seen. IMPRESSION: Large subchorionic hemorrhage with findings that meet definitive criteria for failed pregnancy. This follows SRU consensus guidelines: Diagnostic Criteria for Nonviable Pregnancy Early in the First Trimester. [facility] [phone]. Electronically Signed   By: Aram Candela M.D.   On: 07/21/2023 20:08     Subjective: - no chest pain, shortness of breath, no abdominal pain, nausea or vomiting.   Discharge Exam: BP (!)  96/59 (BP Location: Right Arm)   Pulse (!) 51   Temp (!) 97.5 F (36.4 C) (Oral)   Resp 16   Ht 5\' 2"  (1.575 m)   Wt 76.2 kg   LMP 09/20/2022 (Within Weeks)   SpO2 100%   Breastfeeding Unknown   BMI 30.73 kg/m   General: Pt is alert, awake, not in acute distress Cardiovascular: RRR, S1/S2 +, no rubs, no gallops Respiratory: CTA bilaterally, no wheezing, no rhonchi Abdominal: Soft, NT, ND, bowel sounds + Extremities: no edema, no cyanosis    The results of significant diagnostics from this hospitalization (including imaging, microbiology, ancillary and laboratory) are listed below for reference.     Microbiology: Recent Results (from the past 240 hour(s))  MRSA Next Gen by PCR, Nasal     Status: None   Collection Time: 08/15/23 12:05 AM   Specimen: Nasal Mucosa; Nasal Swab  Result Value Ref Range Status   MRSA by PCR Next Gen NOT DETECTED NOT DETECTED Final    Comment: (NOTE) The GeneXpert MRSA Assay (FDA approved for NASAL specimens only), is one component of a comprehensive MRSA colonization surveillance program. It is not intended to diagnose MRSA infection nor to guide or monitor treatment for MRSA infections. Test performance is not FDA approved in patients less than 62 years old. Performed at [facility], [address], [place], [place] [address]   Aerobic/Anaerobic Culture w Gram Stain (surgical/deep wound)     Status: None (Preliminary result)   Collection Time: 08/15/23  9:29 AM   Specimen: Abscess  Result Value Ref Range Status   Specimen Description   Final    ABSCESS Performed at [facility], [address] [place], [place] [address]    Special Requests   Final    BREAST, RIGHT CENTRAL Performed at [facility], [address] [place], [place] [address]    Gram Stain   Final    ABUNDANT WBC PRESENT, PREDOMINANTLY PMN ABUNDANT GRAM POSITIVE COCCI IN PAIRS MODERATE GRAM NEGATIVE RODS FEW  GRAM POSITIVE RODS    Culture   Final    NO GROWTH < 24 HOURS Performed at [facility], [address], [place], [place] [address]    Report Status PENDING  Incomplete     Labs: Basic Metabolic Panel: Recent Labs  Lab 08/14/23 1830 08/16/23 0332  NA 135 136  K 3.3* 3.5  CL 103 105  CO2 22 22  GLUCOSE 90 111*  BUN 8 13  CREATININE 0.79 0.76  CALCIUM 9.0 8.3*  MG  --  1.9  PHOS  --  5.0*   Liver Function Tests: No results for input(s): "AST", "ALT", "ALKPHOS", "BILITOT", "PROT", "ALBUMIN" in the last 168 hours. CBC: Recent Labs  Lab 08/14/23 1830 08/16/23 0332  WBC 16.0* 9.2  HGB 11.6* 9.3*  HCT 34.6* 28.4*  MCV 96.9 95.9  PLT 325 274   CBG: No results for  input(s): "GLUCAP" in the last 168 hours. Hgb A1c No results for input(s): "HGBA1C" in the last 72 hours. Lipid Profile No results for input(s): "CHOL", "HDL", "LDLCALC", "TRIG", "CHOLHDL", "LDLDIRECT" in the last 72 hours. Thyroid function studies No results for input(s): "TSH", "T4TOTAL", "T3FREE", "THYROIDAB" in the last 72 hours.  Invalid input(s): "FREET3" Urinalysis    Component Value Date/Time   COLORURINE YELLOW 07/21/2023 1902   APPEARANCEUR HAZY (A) 07/21/2023 1902   LABSPEC 1.028 07/21/2023 1902   PHURINE 5.0 07/21/2023 1902   GLUCOSEU NEGATIVE 07/21/2023 1902   HGBUR MODERATE (A) 07/21/2023 1902   BILIRUBINUR NEGATIVE 07/21/2023 1902   KETONESUR 5 (A) 07/21/2023 1902   PROTEINUR 30 (A) 07/21/2023 1902   UROBILINOGEN 0.2 03/18/2015 1204   NITRITE NEGATIVE 07/21/2023 1902   LEUKOCYTESUR NEGATIVE 07/21/2023 1902    FURTHER DISCHARGE INSTRUCTIONS:   Get Medicines reviewed and adjusted: Please take all your medications with you for your next visit with your Primary MD   Laboratory/radiological data: Please request your Primary MD to go over all hospital tests and procedure/radiological results at the follow up, please ask your Primary MD to get all Hospital records sent to his/her  office.   In some cases, they will be blood work, cultures and biopsy results pending at the time of your discharge. Please request that your primary care M.D. goes through all the records of your hospital data and follows up on these results.   Also Note the following: If you experience worsening of your admission symptoms, develop shortness of breath, life threatening emergency, suicidal or homicidal thoughts you must seek medical attention immediately by calling 911 or calling your MD immediately  if symptoms less severe.   You must read complete instructions/literature along with all the possible adverse reactions/side effects for all the Medicines you take and that have been prescribed to you. Take any new Medicines after you have completely understood and accpet all the possible adverse reactions/side effects.    Do not drive when taking Pain medications or sleeping medications (Benzodaizepines)   Do not take more than prescribed Pain, Sleep and Anxiety Medications. It is not advisable to combine anxiety,sleep and pain medications without talking with your primary care practitioner   Special Instructions: If you have smoked or chewed Tobacco  in the last 2 yrs please stop smoking, stop any regular Alcohol  and or any Recreational drug use.   Wear Seat belts while driving.   Please note: You were cared for by a hospitalist during your hospital stay. Once you are discharged, your primary care physician will handle any further medical issues. Please note that NO REFILLS for any discharge medications will be authorized once you are discharged, as it is imperative that you return to your primary care physician (or establish a relationship with a primary care physician if you do not have one) for your post hospital discharge needs so that they can reassess your need for medications and monitor your lab values.  Time coordinating discharge: 35 minutes  SIGNED:  Pamella Pert, MD,  PhD 08/16/2023, 2:03 PM

## 2023-08-16 NOTE — Progress Notes (Signed)
Abscess of right breast  Subjective: Feeling better today after aspiration.  Pain better and breast appears to look better to patient.  Objective: Vital signs in last 24 hours: Temp:  [97.5 F (36.4 C)-98.4 F (36.9 C)] 97.5 F (36.4 C) (08/30 0738) Pulse Rate:  [51-72] 51 (08/30 0738) Resp:  [16-18] 16 (08/30 0738) BP: (91-128)/(59-74) 96/59 (08/30 0738) SpO2:  [100 %] 100 % (08/30 0738)    Intake/Output from previous day: 08/29 0701 - 08/30 0700 In: 730 [P.O.:480; IV Piggyback:250] Out: -  Intake/Output this shift: No intake/output data recorded.  General appearance: alert and cooperative Breasts: right breast with bandaid in place over aspiration site.  No further erythema noted.  Minimal induration just medial to this site, but no further fluctuance or drainage noted.  Lab Results:  Results for orders placed or performed during the hospital encounter of 08/14/23 (from the past 24 hour(s))  CBC     Status: Abnormal   Collection Time: 08/16/23  3:32 AM  Result Value Ref Range   WBC 9.2 4.0 - 10.5 K/uL   RBC 2.96 (L) 3.87 - 5.11 MIL/uL   Hemoglobin 9.3 (L) 12.0 - 15.0 g/dL   HCT 84.6 (L) 96.2 - 95.2 %   MCV 95.9 80.0 - 100.0 fL   MCH 31.4 26.0 - 34.0 pg   MCHC 32.7 30.0 - 36.0 g/dL   RDW 84.1 32.4 - 40.1 %   Platelets 274 150 - 400 K/uL   nRBC 0.0 0.0 - 0.2 %  Magnesium     Status: None   Collection Time: 08/16/23  3:32 AM  Result Value Ref Range   Magnesium 1.9 1.7 - 2.4 mg/dL  Basic metabolic panel     Status: Abnormal   Collection Time: 08/16/23  3:32 AM  Result Value Ref Range   Sodium 136 135 - 145 mmol/L   Potassium 3.5 3.5 - 5.1 mmol/L   Chloride 105 98 - 111 mmol/L   CO2 22 22 - 32 mmol/L   Glucose, Bld 111 (H) 70 - 99 mg/dL   BUN 13 6 - 20 mg/dL   Creatinine, Ser 0.27 0.44 - 1.00 mg/dL   Calcium 8.3 (L) 8.9 - 10.3 mg/dL   GFR, Estimated >25 >36 mL/min   Anion gap 9 5 - 15  Phosphorus     Status: Abnormal   Collection Time: 08/16/23  3:32 AM   Result Value Ref Range   Phosphorus 5.0 (H) 2.5 - 4.6 mg/dL       Assessment: Abscess of right breast, s/p aspiration by IR 8/29  Plan: IV abx, can switch to oral per primary.  Awaiting final cultures given polymicrobial -no needs for surgical intervention -WBC normal today -she may follow up with the breat center for further aspiration if needed.  She may also follow up in our office as needed as well. -discussed this patient with the primary service.  We will be available as needed.  I have reviewed the last 24 hrs of vitals, ins and outs, labs, and discussed this patient with the primary service   LOS: 0 days    Letha Cape, [facility], [place]   08/16/2023 9:55 AM

## 2023-08-16 NOTE — TOC Initial Note (Signed)
Transition of Care (TOC) - Initial/Assessment Note   Spoke to patient at bedside. From home with 5 children.   Requesting transportation home at discharge.   Address in [facility] is her billing address . She lives at [address] [place]   Cab voucher placed on chart. Patient sign transportation waiver. Transportation resources added to AVS . Patient also has Medicaid , discussed Medicaid transportation services.   Patient does not have a PCP . Explained she can call number on insurance card and be provided a complete list of in network provders. Patient prefers NCM to call [facility] and schedule an appointment. Same done and information on AVS.   Pharmacy changed to [facility] . At discharge they will call patient with cost of medications. Patient has two insurances , Patient does NOT qualify for medication assistance through [facility]     Patient does not have  Patient Details  Name: [name] MRN: [id number] Date of Birth: 1986-12-03  Transition of Care  [facility]) CM/SW Contact:    Kingsley Plan, RN Phone Number: 08/16/2023, 11:01 AM  Clinical Narrative:                   Expected Discharge Plan: Home/Self Care Barriers to Discharge: Continued Medical Work up   Patient Goals and CMS Choice Patient states their goals for this hospitalization and ongoing recovery are:: to return to home   Choice offered to / list presented to : NA      Expected Discharge Plan and Services   Discharge Planning Services: CM Consult                       DME Agency: NA       HH Arranged: NA          Prior Living Arrangements/Services   Lives with:: Minor Children Patient language and need for interpreter reviewed:: Yes Do you feel safe going back to the place where you live?: Yes      Need for Family Participation in Patient Care: Yes (Comment) Care giver support system in place?: Yes (comment)   Criminal Activity/Legal Involvement Pertinent to Current  Situation/Hospitalization: No - Comment as needed  Activities of Daily Living Home Assistive Devices/Equipment: None ADL Screening (condition at time of admission) Patient's cognitive ability adequate to safely complete daily activities?: Yes Is the patient deaf or have difficulty hearing?: No Does the patient have difficulty seeing, even when wearing glasses/contacts?: No Does the patient have difficulty concentrating, remembering, or making decisions?: No Patient able to express need for assistance with ADLs?: Yes Does the patient have difficulty dressing or bathing?: No Independently performs ADLs?: Yes (appropriate for developmental age) Does the patient have difficulty walking or climbing stairs?: No Weakness of Legs: None Weakness of Arms/Hands: None  Permission Sought/Granted   Permission granted to share information with : No              Emotional Assessment Appearance:: Appears stated age Attitude/Demeanor/Rapport: Engaged Affect (typically observed): Accepting Orientation: : Oriented to Self, Oriented to Place, Oriented to  Time, Oriented to Situation Alcohol / Substance Use: Not Applicable Psych Involvement: No (comment)  Admission diagnosis:  Breast abscess [N61.1] Vaginal bleeding [N93.9] Abscess of right breast [N61.1] Patient Active Problem List   Diagnosis Date Noted   Abscess of right breast 08/14/2023   Missed abortion 08/14/2023   Hypokalemia 08/14/2023   Depression 02/08/2020   Bipolar disorder (HCC) 02/08/2020   Hx of cesarean  section 02/08/2020   Schizophrenia (HCC) 02/08/2020   Obesity, Class III, BMI 40-49.9 (morbid obesity) (HCC) 03/06/2017   Tobacco use 03/06/2017   PCP:  Patient, No Pcp Per Pharmacy:   [facility] [phone] - [place], [place] - [address] AT [facility] [address] [facility] [address] [place] [address] Phone: [phone] Fax: [phone]  [facility] [address] [place] [address] Phone: [phone] Fax: [phone]     Social Determinants of Health (SDOH) Social History: SDOH Screenings   Food Insecurity: No Food Insecurity (08/15/2023)  Housing: Medium Risk (08/15/2023)  Transportation Needs: Unmet Transportation Needs (08/15/2023)  Utilities: Not At Risk (08/15/2023)  Tobacco Use: High Risk (08/14/2023)   SDOH Interventions:     Readmission Risk Interventions     No data to display

## 2023-08-19 LAB — AEROBIC/ANAEROBIC CULTURE W GRAM STAIN (SURGICAL/DEEP WOUND)

## 2023-09-04 ENCOUNTER — Ambulatory Visit: Payer: BLUE CROSS/BLUE SHIELD | Admitting: Internal Medicine

## 2023-09-17 ENCOUNTER — Telehealth: Payer: Self-pay

## 2024-02-16 ENCOUNTER — Encounter (HOSPITAL_COMMUNITY): Payer: Self-pay

## 2024-02-16 ENCOUNTER — Ambulatory Visit (HOSPITAL_COMMUNITY)
Admission: EM | Admit: 2024-02-16 | Discharge: 2024-02-16 | Disposition: A | Discharge status: Home / Self Care | Attending: Neurology | Admitting: Neurology

## 2024-02-16 ENCOUNTER — Ambulatory Visit (INDEPENDENT_AMBULATORY_CARE_PROVIDER_SITE_OTHER)

## 2024-02-16 DIAGNOSIS — J029 Acute pharyngitis, unspecified: Secondary | ICD-10-CM | POA: Diagnosis not present

## 2024-02-16 DIAGNOSIS — R0602 Shortness of breath: Secondary | ICD-10-CM | POA: Diagnosis not present

## 2024-02-16 LAB — POCT RAPID STREP A (OFFICE): Rapid Strep A Screen: NEGATIVE

## 2024-02-16 LAB — POC COVID19/FLU A&B COMBO
Covid Antigen, POC: NEGATIVE
Influenza A Antigen, POC: NEGATIVE
Influenza B Antigen, POC: NEGATIVE

## 2024-02-16 MED ORDER — PROMETHAZINE-DM 6.25-15 MG/5ML PO SYRP: 5.0000 mL | ORAL_SOLUTION | Freq: Four times a day (QID) | ORAL | 0 refills | Status: DC | PRN

## 2024-02-16 MED ORDER — BENZONATATE 100 MG PO CAPS: 100.0000 mg | ORAL_CAPSULE | Freq: Three times a day (TID) | ORAL | 0 refills | Status: DC

## 2024-02-16 MED ORDER — FLUTICASONE PROPIONATE 50 MCG/ACT NA SUSP: 1.0000 | Freq: Every day | NASAL | 1 refills | Status: DC

## 2024-02-16 MED ORDER — LIDOCAINE VISCOUS HCL 2 % MT SOLN: 15.0000 mL | Freq: Four times a day (QID) | OROMUCOSAL | 0 refills | Status: DC | PRN

## 2024-02-16 NOTE — ED Provider Notes (Signed)
 [facility]    CSN: [id number] Arrival date & time: 02/16/24  1533      History   Chief Complaint Chief Complaint  Patient presents with   Sore Throat    Sore throat, lower right sided facial pain and difficulty swallowing. X2 days    HPI [name] is a 38 y.o. female.   Presenting with throat pain, swelling, cough, shortness of breath, sinus pressure for approximately 2 days.  She states that her kids have been sick.  Her cough has worsened over the last day and has been worse at night.  She is having pain with her swallowing and with eating.  She also states that she is having a lot of sinus pressure.  She has not taken any over-the-counter medications for this.  She does not take any medications for allergies or for her sinuses either.  Denies nausea, vomiting, diarrhea or headache.  The history is provided by the patient.  Sore Throat    Past Medical History:  Diagnosis Date   Asthma    Chronic hypertension in pregnancy 05/12/2020   Diagnosed 5/27. Refused preeclampsia labs     Fetal echogenic intracardiac focus on prenatal ultrasound 15-Feb-2020   GBS (group B Streptococcus carrier), +RV culture, currently pregnant 05/23/2020   History of pre-eclampsia    Hx of gallstones    Hx successful VBAC (vaginal birth after cesarean), currently pregnant 02-15-20   Neonatal death in prior pregnancy, currently pregnant 05/29/2020   Patient reports baby was born premature and passed away in the hospital     Normal labor 05/29/2020   Obesity    Supervision of other high risk pregnancy, antepartum 03/09/2020              Nursing Staff    Provider      Office Location     [facility]                Language      english    Anatomy US            Flu Vaccine          Genetic Screen     NIPS:   AFP:   First Screen:  Quad:          TDaP vaccine          Hgb A1C or   GTT    Early   Third trimester       Rhogam               LAB RESULTS                 Blood Type             Feeding Plan     breast / bottle    Antib   Trichomonal vaginitis during pregnancy in third trimester Feb 15, 2020   Tx 05/11/2020      Patient Active Problem List   Diagnosis Date Noted   Abscess of right breast 08/14/2023   Missed abortion 08/14/2023   Hypokalemia 08/14/2023   Depression Feb 15, 2020   Bipolar disorder (HCC) 02-15-20   Hx of cesarean section February 15, 2020   Schizophrenia (HCC) 02-15-20   Obesity, Class III, BMI 40-49.9 (morbid obesity) (HCC) 03/06/2017   Tobacco use 03/06/2017    Past Surgical History:  Procedure Laterality Date   BREAST BIOPSY Left 07/31/2013   Procedure: IRRIGATION AND DEBRIDMENT LEFT BREAST ABSCESS;  Surgeon: Lodema Pilot, DO;  Location: [facility]  ORS;  Service: General;  Laterality: Left;  IRRIGATION AND DEBRIDEMENT LEFT BREAST ABSCESS   CESAREAN SECTION     CESAREAN SECTION  06/09/2022   Procedure: CESAREAN SECTION;  Surgeon: Warden Fillers, MD;  Location: [facility] LD ORS;  Service: Obstetrics;;    OB History     Gravida  7   Para  6   Term  2   Preterm  4   AB      Living  5      SAB      IAB      Ectopic      Multiple  0   Live Births  6            Home Medications    Prior to Admission medications   Medication Sig Start Date End Date Taking? Authorizing Provider  benzonatate (TESSALON) 100 MG capsule Take 1 capsule (100 mg total) by mouth every 8 (eight) hours. 02/16/24  Yes Oskar Cretella, Ludger Nutting, NP  fluticasone (FLONASE) 50 MCG/ACT nasal spray Place 1 spray into both nostrils daily. 02/16/24  Yes Burke Terry, Ludger Nutting, NP  lidocaine (XYLOCAINE) 2 % solution Use as directed 15 mLs in the mouth or throat every 6 (six) hours as needed for mouth pain. 02/16/24  Yes Sierria Bruney, Ludger Nutting, NP  melatonin 5 MG TABS Take 10 mg by mouth at bedtime as needed (for sleep).   Yes [provider]  promethazine-dextromethorphan (PROMETHAZINE-DM) 6.25-15 MG/5ML syrup Take 5 mLs by mouth 4 (four) times daily as needed for cough. 02/16/24  Yes Analiza Cowger, Ludger Nutting, NP   Cholecalciferol (VITAMIN D3) 75 MCG (3000 UT) TABS Take 3,000 Units by mouth daily.    [provider]  misoprostol (CYTOTEC) 200 MCG tablet Take 2 tablets (400 mcg total) by mouth in the morning, at noon, and at bedtime for 3 days. Patient not taking: Reported on 08/14/2023 07/21/23 08/14/23  Wyn Forster, MD  ondansetron (ZOFRAN ODT) 8 MG disintegrating tablet Take 1 tablet (8 mg total) by mouth every 8 (eight) hours as needed for nausea or vomiting. Patient not taking: Reported on 08/14/2023 07/21/23   Wyn Forster, MD  oxyCODONE-acetaminophen (PERCOCET/ROXICET) 5-325 MG tablet Take 1 tablet by mouth every 8 (eight) hours as needed for severe pain. 08/16/23   Leatha Gilding, MD    Family History Family History  Problem Relation Age of Onset   Diabetes Father     Social History Social History   Tobacco Use   Smoking status: Some Days    Types: Cigarettes, Cigars   Smokeless tobacco: Never   Tobacco comments:    rare   Vaping Use   Vaping status: Never Used  Substance Use Topics   Alcohol use: Yes   Drug use: Not Currently    Types: Cocaine     Allergies   Patient has no known allergies.   Review of Systems Review of Systems   Physical Exam Triage Vital Signs ED Triage Vitals  Encounter Vitals Group     BP 02/16/24 1616 (!) 159/110     Systolic BP Percentile --      Diastolic BP Percentile --      Pulse Rate 02/16/24 1616 100     Resp 02/16/24 1616 17     Temp 02/16/24 1616 98.2 F (36.8 C)     Temp Source 02/16/24 1616 Oral     SpO2 02/16/24 1616 100 %     Weight 02/16/24 1614 185 lb (83.9 kg)     Height 02/16/24  1614 5\' 1"  (1.549 m)     Head Circumference --      Peak Flow --      Pain Score 02/16/24 1614 10     Pain Loc --      Pain Education --      Exclude from Growth Chart --    No data found.  Updated Vital Signs BP (!) 159/110 (BP Location: Left Arm)   Pulse 100   Temp 98.2 F (36.8 C) (Oral)   Resp 17   Ht 5\' 1"  (1.549 m)    Wt 185 lb (83.9 kg)   LMP 02/13/2024 (Within Weeks)   SpO2 100%   BMI 34.96 kg/m   Visual Acuity Right Eye Distance:   Left Eye Distance:   Bilateral Distance:    Right Eye Near:   Left Eye Near:    Bilateral Near:     Physical Exam Constitutional:      Appearance: She is obese.  HENT:     Right Ear: Tympanic membrane normal.     Left Ear: Tympanic membrane normal.     Nose:     Right Sinus: Maxillary sinus tenderness present.     Left Sinus: Maxillary sinus tenderness present.  Cardiovascular:     Rate and Rhythm: Normal rate and regular rhythm.     Pulses: Normal pulses.  Pulmonary:     Effort: Pulmonary effort is normal.     Breath sounds: Rales present.     Comments: Coarse breath sounds clear with cough Musculoskeletal:     Cervical back: Normal range of motion.  Lymphadenopathy:     Cervical: Cervical adenopathy present.  Neurological:     General: No focal deficit present.     Mental Status: She is alert.      UC Treatments / Results  Labs (all labs ordered are listed, but only abnormal results are displayed) Labs Reviewed  CULTURE, GROUP A STREP [facility])  POCT RAPID STREP A (OFFICE)  POC COVID19/FLU A&B COMBO    EKG   Radiology DG Chest 2 View Result Date: 02/16/2024 CLINICAL DATA:  Shortness of breath. EXAM: CHEST - 2 VIEW COMPARISON:  November 13, 2022 FINDINGS: The heart size and mediastinal contours are within normal limits. Both lungs are clear. The visualized skeletal structures are unremarkable. IMPRESSION: No active cardiopulmonary disease. Electronically Signed   By: Aram Candela M.D.   On: 02/16/2024 17:06    Procedures Procedures (including critical care time)  Medications Ordered in UC Medications - No data to display  Initial Impression / Assessment and Plan / UC Course  I have reviewed the triage vital signs and the nursing notes.  Pertinent labs & imaging results that were available during my care of the patient were  reviewed by me and considered in my medical decision making (see chart for details).   Symptoms consistent with a viral pharyngitis.  Given that her kids are sick I will send her throat swab for culture and staff will follow-up with her if her culture is positive.  For now symptom management with promethazine DM, Tessalon Perles, Flonase, and lidocaine swish.  Red flag symptoms explained and she will return if improving in the next couple of days.      Final Clinical Impressions(s) / UC Diagnoses   Final diagnoses:  Shortness of breath  Acute pharyngitis, unspecified etiology     Discharge Instructions      Your COVID, flu, strep testing was negative today. Staff will call you if  the throat culture is positive for bacteria in the next 2-3 days and call in treatment if necessary based on result.   For now, we will treat this as a viral infection with the following: - Over the counter medicines as needed for pain and swelling of the throat (Ibuprofen 600mg  and/or Tylenol 1,000mg  every 6 hours as needed) - 1 tablespoon of honey in warm water and/or salt water gargles every 3-4 hours -Use cough syrup and Tessalon Perles for cough as needed.  The cough syrup may make you tired to use this at night. -Use Flonase daily for sinus pressure as needed. -May use a lidocaine swish for mouth and throat pain Seek medical care if you develop changes to your voice, inability to swallow, drooling, or any new symptoms. If symptoms are severe, please go to the ER. I hope you feel better!      ED Prescriptions     Medication Sig Dispense Auth. Provider   lidocaine (XYLOCAINE) 2 % solution Use as directed 15 mLs in the mouth or throat every 6 (six) hours as needed for mouth pain. 15 mL Elmer Picker, NP   promethazine-dextromethorphan (PROMETHAZINE-DM) 6.25-15 MG/5ML syrup Take 5 mLs by mouth 4 (four) times daily as needed for cough. 118 mL Elmer Picker, NP   benzonatate (TESSALON) 100 MG capsule  Take 1 capsule (100 mg total) by mouth every 8 (eight) hours. 21 capsule Pamalee Leyden, [place], NP   fluticasone (FLONASE) 50 MCG/ACT nasal spray Place 1 spray into both nostrils daily. 16 each Elmer Picker, NP      PDMP not reviewed this encounter.   Elmer Picker, NP 02/16/24 1726

## 2024-02-16 NOTE — ED Triage Notes (Signed)
 Pt states that she has a sore throat, lower right sided facial pain and difficulty swallowing. X2 days

## 2024-02-16 NOTE — Discharge Instructions (Addendum)
 Your COVID, flu, strep testing was negative today. Staff will call you if the throat culture is positive for bacteria in the next 2-3 days and call in treatment if necessary based on result.   For now, we will treat this as a viral infection with the following: - Over the counter medicines as needed for pain and swelling of the throat (Ibuprofen 600mg  and/or Tylenol 1,000mg  every 6 hours as needed) - 1 tablespoon of honey in warm water and/or salt water gargles every 3-4 hours -Use cough syrup and Tessalon Perles for cough as needed.  The cough syrup may make you tired to use this at night. -Use Flonase daily for sinus pressure as needed. -May use a lidocaine swish for mouth and throat pain Seek medical care if you develop changes to your voice, inability to swallow, drooling, or any new symptoms. If symptoms are severe, please go to the ER. I hope you feel better!

## 2024-02-19 LAB — CULTURE, GROUP A STREP (THRC)

## 2024-02-21 ENCOUNTER — Ambulatory Visit (HOSPITAL_COMMUNITY)
Admission: EM | Admit: 2024-02-21 | Discharge: 2024-02-21 | Disposition: A | Discharge status: Home / Self Care | Attending: Emergency Medicine | Admitting: Emergency Medicine

## 2024-02-21 ENCOUNTER — Encounter (HOSPITAL_COMMUNITY): Payer: Self-pay

## 2024-02-21 DIAGNOSIS — J029 Acute pharyngitis, unspecified: Secondary | ICD-10-CM | POA: Diagnosis not present

## 2024-02-21 DIAGNOSIS — R051 Acute cough: Secondary | ICD-10-CM | POA: Diagnosis not present

## 2024-02-21 DIAGNOSIS — R030 Elevated blood-pressure reading, without diagnosis of hypertension: Secondary | ICD-10-CM

## 2024-02-21 MED ORDER — METHYLPREDNISOLONE SODIUM SUCC 125 MG IJ SOLR
INTRAMUSCULAR | Status: AC
  Filled 2024-02-21: qty 2

## 2024-02-21 MED ORDER — METHYLPREDNISOLONE SODIUM SUCC 125 MG IJ SOLR
60.0000 mg | Freq: Once | INTRAMUSCULAR | Status: AC
  Administered 2024-02-21: 60 mg via INTRAMUSCULAR

## 2024-02-21 MED ORDER — PROMETHAZINE-DM 6.25-15 MG/5ML PO SYRP: 5.0000 mL | ORAL_SOLUTION | Freq: Four times a day (QID) | ORAL | 0 refills | Status: DC | PRN

## 2024-02-21 MED ORDER — AMOXICILLIN 500 MG PO CAPS: 500.0000 mg | ORAL_CAPSULE | Freq: Two times a day (BID) | ORAL | 0 refills | Status: AC

## 2024-02-21 MED ORDER — BENZONATATE 100 MG PO CAPS: 100.0000 mg | ORAL_CAPSULE | Freq: Three times a day (TID) | ORAL | 0 refills | Status: DC | PRN

## 2024-02-21 NOTE — ED Triage Notes (Signed)
 Patient states having pain with swallowing and feels like something is stuck in her throat. Also having SOB. NKDA. Was seen a few days ago for the throat pain, the meds helped for a little but now getting worse. States feels like her throat is closing.

## 2024-02-21 NOTE — ED Provider Notes (Signed)
 [facility]    CSN: [id number] Arrival date & time: 02/21/24  1429     History   Chief Complaint Chief Complaint  Patient presents with   Shortness of Breath   Sore Throat    HPI [name] is a 38 y.o. female.  Yesterday developed sensation that something is stuck in her throat. Now feels her throat is closing. Has pain with swallowing, which has been occurring for about 1 week. Was seen here at onset, had negative rapid strep and culture, negative covid/flu, and negative chest xray.  Had been using lidocaine gargle.  Not necessarily short of breath but feels discomfort breathing due to the throat. She denies any hives or rash. No swelling of lips or tongue. No abd pain or vomiting.  She does not have a primary care. No documented history of hypertension. She does have history of HTN in pregnancy. LMP 2/27, denies possibility of pregnancy.  BP at visit 5 days ago was 160/110   Past Medical History:  Diagnosis Date   Asthma    Chronic hypertension in pregnancy 05/12/2020   Diagnosed 5/27. Refused preeclampsia labs     Fetal echogenic intracardiac focus on prenatal ultrasound 02/27/2020   GBS (group B Streptococcus carrier), +RV culture, currently pregnant 05/23/2020   History of pre-eclampsia    Hx of gallstones    Hx successful VBAC (vaginal birth after cesarean), currently pregnant 2020-02-27   Neonatal death in prior pregnancy, currently pregnant 05/29/2020   Patient reports baby was born premature and passed away in the hospital     Normal labor 05/29/2020   Obesity    Supervision of other high risk pregnancy, antepartum 03/09/2020              Nursing Staff    Provider      Office Location     [facility]                Language      english    Anatomy US            Flu Vaccine          Genetic Screen     NIPS:   AFP:   First Screen:  Quad:          TDaP vaccine          Hgb A1C or   GTT    Early   Third trimester       Rhogam               LAB RESULTS                  Blood Type            Feeding Plan     breast / bottle    Antib   Trichomonal vaginitis during pregnancy in third trimester 02-27-20   Tx 05/11/2020      Patient Active Problem List   Diagnosis Date Noted   Abscess of right breast 08/14/2023   Missed abortion 08/14/2023   Hypokalemia 08/14/2023   Depression 02-27-2020   Bipolar disorder (HCC) 2020-02-27   Hx of cesarean section 02/27/2020   Schizophrenia (HCC) February 27, 2020   Obesity, Class III, BMI 40-49.9 (morbid obesity) (HCC) 03/06/2017   Tobacco use 03/06/2017    Past Surgical History:  Procedure Laterality Date   BREAST BIOPSY Left 07/31/2013   Procedure: IRRIGATION AND DEBRIDMENT LEFT BREAST ABSCESS;  Surgeon: Lodema Pilot, DO;  Location: [facility];  Service: General;  Laterality: Left;  IRRIGATION AND DEBRIDEMENT LEFT BREAST ABSCESS   CESAREAN SECTION     CESAREAN SECTION  06/09/2022   Procedure: CESAREAN SECTION;  Surgeon: Warden Fillers, MD;  Location: [facility];  Service: Obstetrics;;    OB History     Gravida  7   Para  6   Term  2   Preterm  4   AB      Living  5      SAB      IAB      Ectopic      Multiple  0   Live Births  6            Home Medications    Prior to Admission medications   Medication Sig Start Date End Date Taking? Authorizing Provider  amoxicillin (AMOXIL) 500 MG capsule Take 1 capsule (500 mg total) by mouth 2 (two) times daily for 10 days. 02/21/24 03/02/24 Yes Kenae Lindquist, Lurena Joiner, PA-C  benzonatate (TESSALON) 100 MG capsule Take 1 capsule (100 mg total) by mouth 3 (three) times daily as needed for cough. 02/21/24  Yes Kolbie Lepkowski, Lurena Joiner, PA-C  promethazine-dextromethorphan (PROMETHAZINE-DM) 6.25-15 MG/5ML syrup Take 5 mLs by mouth 4 (four) times daily as needed for cough. 02/21/24  Yes Ronnika Collett, Lurena Joiner, PA-C    Family History Family History  Problem Relation Age of Onset   Diabetes Father     Social History Social History   Tobacco Use   Smoking status: Some Days     Types: Cigarettes, Cigars   Smokeless tobacco: Never   Tobacco comments:    rare   Vaping Use   Vaping status: Never Used  Substance Use Topics   Alcohol use: Yes   Drug use: Not Currently    Types: Cocaine     Allergies   Patient has no known allergies.   Review of Systems Review of Systems Per HPI  Physical Exam Triage Vital Signs ED Triage Vitals  Encounter Vitals Group     BP 02/21/24 1437 (!) 189/129     Systolic BP Percentile --      Diastolic BP Percentile --      Pulse Rate 02/21/24 1437 98     Resp 02/21/24 1437 20     Temp 02/21/24 1437 97.9 F (36.6 C)     Temp Source 02/21/24 1437 Oral     SpO2 02/21/24 1437 100 %     Weight --      Height --      Head Circumference --      Peak Flow --      Pain Score 02/21/24 1439 10     Pain Loc --      Pain Education --      Exclude from Growth Chart --    No data found.  Updated Vital Signs BP (!) 185/144   Pulse 86   Temp 97.9 F (36.6 C) (Oral)   Resp 20   LMP 02/13/2024 (Within Weeks)   SpO2 100%   Breastfeeding No    Physical Exam Vitals and nursing note reviewed.  Constitutional:      General: She is not in acute distress.    Appearance: She is not ill-appearing or diaphoretic.  HENT:     Nose: Nose normal.     Mouth/Throat:     Mouth: Mucous membranes are moist.     Pharynx: Oropharynx is clear. Posterior oropharyngeal erythema present. No oropharyngeal exudate.  Comments: Erythema of posterior pharynx. Epiglottis is visualized, erythematous and not exudative. Uvula midline, not swollen. Patent airway and tolerating secretions. Voice somewhat hoarse. Left tonsil 1+. After medication, normal phonation  Eyes:     Conjunctiva/sclera: Conjunctivae normal.  Cardiovascular:     Rate and Rhythm: Normal rate and regular rhythm.     Pulses: Normal pulses.     Heart sounds: Normal heart sounds.  Pulmonary:     Effort: Pulmonary effort is normal. No respiratory distress.     Breath sounds:  Normal breath sounds. No stridor. No wheezing, rhonchi or rales.  Musculoskeletal:     Cervical back: No rigidity.  Lymphadenopathy:     Cervical: No cervical adenopathy.  Skin:    General: Skin is warm and dry.  Neurological:     Mental Status: She is alert and oriented to person, place, and time.     UC Treatments / Results  Labs (all labs ordered are listed, but only abnormal results are displayed) Labs Reviewed - No data to display  EKG  Radiology No results found.  Procedures Procedures  Medications Ordered in UC Medications  methylPREDNISolone sodium succinate (SOLU-MEDROL) 125 mg/2 mL injection 60 mg (60 mg Intramuscular Given 02/21/24 1455)    Initial Impression / Assessment and Plan / UC Course  I have reviewed the triage vital signs and the nursing notes.  Pertinent labs & imaging results that were available during my care of the patient were reviewed by me and considered in my medical decision making (see chart for details).  Somewhat hoarse phonation. She is tolerating secretions. No acute respiratory distress. Sating 100% on room air. Her BP today is 190/130 on initial check. Remains elevated on recheck.  IM solumedrol given for sensation of throat swelling This has now resolved. Throat pain with swallowing has persisted. Patient voice now normal phonation. She is requesting medication for cough.  With 1 week of sore throat and worsening pain, will go ahead and treat with amoxicillin BID x 10 days. Have also sent tessalon and promethazine as these have helped her in the past.   Set up with primary care appointment for 3/18 (in 11 days). Discussed importance of follow up and dangers of uncontrolled high blood pressure.   Strict emergency department precautions for worsening pain or if swelling sensation re-occurs. Patient verbalizes understanding.   Final Clinical Impressions(s) / UC Diagnoses   Final diagnoses:  Sore throat  Acute cough  Elevated BP  without diagnosis of hypertension     Discharge Instructions      I am treating you for throat infection. Take the amoxicillin twice daily for 10 days in a row. Finish all of the pills - you shouldn't have any left over. Take with food to avoid upset stomach.  Tessalon -- cough pills taken 3x daily Promethazine - cough syrup taken 4x daily  Please go to the emergency department if symptoms worsen.  You need to follow with a primary care provider regarding your high blood pressure. Your appointment details are below. Please do not miss this appointment.      ED Prescriptions     Medication Sig Dispense Auth. Provider   promethazine-dextromethorphan (PROMETHAZINE-DM) 6.25-15 MG/5ML syrup Take 5 mLs by mouth 4 (four) times daily as needed for cough. 240 mL Lubna Stegeman, PA-C   benzonatate (TESSALON) 100 MG capsule Take 1 capsule (100 mg total) by mouth 3 (three) times daily as needed for cough. 30 capsule Joanne Salah, PA-C   amoxicillin (AMOXIL) 500  MG capsule Take 1 capsule (500 mg total) by mouth 2 (two) times daily for 10 days. 20 capsule Tachina Spoonemore, Lurena Joiner, PA-C      PDMP not reviewed this encounter.   Anniston Nellums, Lurena Joiner, [place] 02/21/24 1622

## 2024-02-21 NOTE — Discharge Instructions (Signed)
 I am treating you for throat infection. Take the amoxicillin twice daily for 10 days in a row. Finish all of the pills - you shouldn't have any left over. Take with food to avoid upset stomach.  Tessalon -- cough pills taken 3x daily Promethazine - cough syrup taken 4x daily  Please go to the emergency department if symptoms worsen.  You need to follow with a primary care provider regarding your high blood pressure. Your appointment details are below. Please do not miss this appointment.

## 2024-03-03 ENCOUNTER — Ambulatory Visit (HOSPITAL_BASED_OUTPATIENT_CLINIC_OR_DEPARTMENT_OTHER): Admitting: Student

## 2024-05-11 ENCOUNTER — Other Ambulatory Visit: Payer: Self-pay

## 2024-05-11 ENCOUNTER — Emergency Department (HOSPITAL_COMMUNITY)
Admission: EM | Admit: 2024-05-11 | Discharge: 2024-05-11 | Disposition: A | Discharge status: Home / Self Care | Payer: MEDICAID | Attending: Emergency Medicine | Admitting: Emergency Medicine

## 2024-05-11 ENCOUNTER — Encounter (HOSPITAL_COMMUNITY): Payer: Self-pay

## 2024-05-11 DIAGNOSIS — R21 Rash and other nonspecific skin eruption: Secondary | ICD-10-CM | POA: Diagnosis present

## 2024-05-11 DIAGNOSIS — L259 Unspecified contact dermatitis, unspecified cause: Secondary | ICD-10-CM | POA: Insufficient documentation

## 2024-05-11 MED ORDER — PREDNISONE 20 MG PO TABS
60.0000 mg | ORAL_TABLET | Freq: Once | ORAL | Status: AC
  Administered 2024-05-11: 60 mg via ORAL
  Filled 2024-05-11: qty 3

## 2024-05-11 MED ORDER — HYDROCORTISONE 1 % EX CREA: 1.0000 | TOPICAL_CREAM | Freq: Two times a day (BID) | CUTANEOUS | 2 refills | Status: DC

## 2024-05-11 MED ORDER — HYDROCORTISONE 1 % EX CREA
TOPICAL_CREAM | Freq: Two times a day (BID) | CUTANEOUS | Status: DC
  Filled 2024-05-11: qty 28

## 2024-05-11 MED ORDER — PREDNISONE 20 MG PO TABS: ORAL_TABLET | ORAL | 0 refills | Status: DC

## 2024-05-11 NOTE — ED Provider Notes (Signed)
 [facility] Emergency Department Provider Note MRN:  [id number]  Arrival date & time: 05/11/24     Chief Complaint   Allergic Reaction   History of Present Illness   [name] is a 38 y.o. year-old female presents to the ED with chief complaint of rash on chest, arms, neck and lower face.  She states that she noticed the rash yesterday.  She states that she has been using a different body wash, but only noticed the rash on the arms, chest, and neck.  She states that she was working out in the yard a few days ago.  She hasn't tried taking anything for her symptoms.  She denies hx of DM.  She states that she doesn't have a doctor.  History provided by patient.   Review of Systems  Pertinent positive and negative review of systems noted in HPI.    Physical Exam   Vitals:   05/11/24 0531  BP: (!) 165/107  Pulse: 86  Resp: 16  Temp: 98.3 F (36.8 C)  SpO2: 96%    CONSTITUTIONAL:  non toxic-appearing, NAD NEURO:  Alert and oriented x 3, CN 3-12 grossly intact EYES:  eyes equal and reactive ENT/NECK:  Supple, no stridor  CARDIO:  normal rate, regular rhythm, appears well-perfused  PULM:  No respiratory distress, CTAB GI/GU:  non-distended,  MSK/SPINE:  No gross deformities, no edema, moves all extremities  SKIN:  maculopapular rash on chest, left arm, and neck with a few dark spots and a few vesicles.    *Additional and/or pertinent findings included in MDM below  Diagnostic and Interventional Summary    EKG Interpretation Date/Time:    Ventricular Rate:    PR Interval:    QRS Duration:    QT Interval:    QTC Calculation:   R Axis:      Text Interpretation:         Labs Reviewed - No data to display  No orders to display    Medications  predniSONE (DELTASONE) tablet 60 mg (has no administration in time range)     Procedures  /  Critical Care Procedures  ED Course and Medical Decision Making  I have reviewed the triage  vital signs, the nursing notes, and pertinent available records from the EMR.  Social Determinants Affecting Complexity of Care: Patient has no clinically significant social determinants affecting this chief complaint..   ED Course:    Medical Decision Making Patient here with a rash on upper extremities, chest, and neck and lower face.  Onset yesterday.  Has had some new exposures both in body wash and in exposures to working out in the yard.  The rash does appear consistent with a contact dermatitis.  Will give her dose of steroids here.  Will discharge home with a taper.  Risk Prescription drug management.         Consultants: No consultations were needed in caring for this patient.   Treatment and Plan: Emergency department workup does not suggest an emergent condition requiring admission or immediate intervention beyond  what has been performed at this time. The patient is safe for discharge and has  been instructed to return immediately for worsening symptoms, change in  symptoms or any other concerns    Final Clinical Impressions(s) / ED Diagnoses     ICD-10-CM   1. Contact dermatitis, unspecified contact dermatitis type, unspecified trigger  L25.9       ED Discharge Orders  Ordered    predniSONE  (DELTASONE ) 20 MG tablet        05/11/24 0615              Discharge Instructions Discussed with and Provided to Patient:   Discharge Instructions   None      Sherel Dikes, PA-C 05/11/24 4098    Kelsey Patricia, MD 05/11/24 [id number]

## 2024-05-11 NOTE — ED Triage Notes (Signed)
 Pt complaining of rash on the arms and chest that started yesterday. Used a different body wash a couple of days ago and she has been doing things outside in the yard. Has not taken anything for it.

## 2024-05-13 ENCOUNTER — Encounter (HOSPITAL_COMMUNITY): Payer: Self-pay | Admitting: Emergency Medicine

## 2024-05-13 ENCOUNTER — Other Ambulatory Visit: Payer: Self-pay

## 2024-05-13 ENCOUNTER — Ambulatory Visit (HOSPITAL_COMMUNITY)
Admission: EM | Admit: 2024-05-13 | Discharge: 2024-05-13 | Disposition: A | Discharge status: Home / Self Care | Payer: MEDICAID | Attending: Emergency Medicine | Admitting: Emergency Medicine

## 2024-05-13 DIAGNOSIS — J029 Acute pharyngitis, unspecified: Secondary | ICD-10-CM

## 2024-05-13 LAB — POCT RAPID STREP A (OFFICE): Rapid Strep A Screen: NEGATIVE

## 2024-05-13 MED ORDER — ACETAMINOPHEN 325 MG PO TABS
975.0000 mg | ORAL_TABLET | Freq: Once | ORAL | Status: AC
  Administered 2024-05-13: 975 mg via ORAL

## 2024-05-13 MED ORDER — LIDOCAINE VISCOUS HCL 2 % MT SOLN: 15.0000 mL | OROMUCOSAL | 0 refills | Status: AC | PRN

## 2024-05-13 MED ORDER — ACETAMINOPHEN 325 MG PO TABS
ORAL_TABLET | ORAL | Status: AC
  Filled 2024-05-13: qty 3

## 2024-05-13 MED ORDER — BENZONATATE 100 MG PO CAPS: 100.0000 mg | ORAL_CAPSULE | Freq: Three times a day (TID) | ORAL | 0 refills | Status: DC

## 2024-05-13 NOTE — ED Provider Notes (Addendum)
 [facility]    CSN: [id number] Arrival date & time: 05/13/24  1543      History   Chief Complaint Chief Complaint  Patient presents with   Sore Throat    HPI [name] is a 38 y.o. female.   Patient presents with sore throat that began yesterday.  Patient has also had intermittent headache.  Patient states that she has some pain to the right side of her neck.  Patient also endorses some bodyaches and chills.  Patient states that she has had a little difficulty swallowing due to pain.  Patient denies cough, congestion, known fever, nausea, vomiting, diarrhea, and abdominal pain.  The history is provided by the patient and medical records.  Sore Throat    Past Medical History:  Diagnosis Date   Asthma    Chronic hypertension in pregnancy 05/12/2020   Diagnosed 5/27. Refused preeclampsia labs     Fetal echogenic intracardiac focus on prenatal ultrasound 02-16-20   GBS (group B Streptococcus carrier), +RV culture, currently pregnant 05/23/2020   History of pre-eclampsia    Hx of gallstones    Hx successful VBAC (vaginal birth after cesarean), currently pregnant 2020/02/16   Neonatal death in prior pregnancy, currently pregnant 05/29/2020   Patient reports baby was born premature and passed away in the hospital     Normal labor 05/29/2020   Obesity    Supervision of other high risk pregnancy, antepartum 03/09/2020              Nursing Staff    Provider      Office Location     [facility]                Language      english    Anatomy US             Flu Vaccine          Genetic Screen     NIPS:   AFP:   First Screen:  Quad:          TDaP vaccine          Hgb A1C or   GTT    Early   Third trimester       Rhogam               LAB RESULTS                 Blood Type            Feeding Plan     breast / bottle    Antib   Trichomonal vaginitis during pregnancy in third trimester 02-16-2020   Tx 05/11/2020      Patient Active Problem List   Diagnosis Date Noted    Abscess of right breast 08/14/2023   Missed abortion 08/14/2023   Hypokalemia 08/14/2023   Depression February 16, 2020   Bipolar disorder (HCC) 02-16-2020   Hx of cesarean section 2020/02/16   Schizophrenia (HCC) 16-Feb-2020   Obesity, Class III, BMI 40-49.9 (morbid obesity) 03/06/2017   Tobacco use 03/06/2017    Past Surgical History:  Procedure Laterality Date   BREAST BIOPSY Left 07/31/2013   Procedure: IRRIGATION AND DEBRIDMENT LEFT BREAST ABSCESS;  Surgeon: Evander Hills, DO;  Location: [facility];  Service: General;  Laterality: Left;  IRRIGATION AND DEBRIDEMENT LEFT BREAST ABSCESS   CESAREAN SECTION     CESAREAN SECTION  06/09/2022   Procedure: CESAREAN SECTION;  Surgeon: Abigail Abler, MD;  Location: [facility];  Service: Obstetrics;;    OB History     Gravida  7   Para  6   Term  2   Preterm  4   AB      Living  5      SAB      IAB      Ectopic      Multiple  0   Live Births  6            Home Medications    Prior to Admission medications   Medication Sig Start Date End Date Taking? Authorizing Provider  lidocaine  (XYLOCAINE ) 2 % solution Use as directed 15 mLs in the mouth or throat as needed. 05/13/24  Yes Rosevelt Constable, Ysabella Babiarz A, NP  hydrocortisone  cream 1 % Apply 1 Application topically 2 (two) times daily. Don't apply to face. 05/11/24   Sherel Dikes, PA-C    Family History Family History  Problem Relation Age of Onset   Diabetes Father     Social History Social History   Tobacco Use   Smoking status: Some Days    Types: Cigarettes, Cigars   Smokeless tobacco: Never   Tobacco comments:    rare   Vaping Use   Vaping status: Never Used  Substance Use Topics   Alcohol use: Yes   Drug use: Not Currently    Types: Cocaine     Allergies   Patient has no known allergies.   Review of Systems Review of Systems  Per HPI  Physical Exam Triage Vital Signs ED Triage Vitals  Encounter Vitals Group     BP 05/13/24 1804 (!) 151/111      Systolic BP Percentile --      Diastolic BP Percentile --      Pulse Rate 05/13/24 1804 93     Resp 05/13/24 1804 20     Temp 05/13/24 1804 98.4 F (36.9 C)     Temp Source 05/13/24 1804 Oral     SpO2 05/13/24 1804 97 %     Weight --      Height --      Head Circumference --      Peak Flow --      Pain Score 05/13/24 1801 10     Pain Loc --      Pain Education --      Exclude from Growth Chart --    No data found.  Updated Vital Signs BP (!) 151/111 (BP Location: Left Arm)   Pulse 93   Temp 98.4 F (36.9 C) (Oral)   Resp 20   LMP 03/31/2024 (Within Weeks)   SpO2 97%   Visual Acuity Right Eye Distance:   Left Eye Distance:   Bilateral Distance:    Right Eye Near:   Left Eye Near:    Bilateral Near:     Physical Exam Vitals and nursing note reviewed.  Constitutional:      General: She is awake. She is not in acute distress.    Appearance: Normal appearance. She is well-developed and well-groomed. She is not ill-appearing.  HENT:     Right Ear: Tympanic membrane, ear canal and external ear normal.     Left Ear: Tympanic membrane, ear canal and external ear normal.     Nose: Congestion and rhinorrhea present.     Mouth/Throat:     Mouth: Mucous membranes are moist.     Pharynx: Posterior oropharyngeal erythema and postnasal drip present. No oropharyngeal exudate.  Eyes:  Extraocular Movements: Extraocular movements intact.     Pupils: Pupils are equal, round, and reactive to light.  Cardiovascular:     Rate and Rhythm: Normal rate and regular rhythm.  Pulmonary:     Effort: Pulmonary effort is normal.     Breath sounds: Normal breath sounds.  Musculoskeletal:     Cervical back: Normal range of motion and neck supple.  Lymphadenopathy:     Cervical: No cervical adenopathy.  Skin:    General: Skin is warm and dry.  Neurological:     Mental Status: She is alert.     GCS: GCS eye subscore is 4. GCS verbal subscore is 5. GCS motor subscore is 6.     Cranial  Nerves: Cranial nerves 2-12 are intact.     Sensory: Sensation is intact.     Motor: Motor function is intact.     Coordination: Coordination is intact.     Gait: Gait is intact.  Psychiatric:        Behavior: Behavior is cooperative.      UC Treatments / Results  Labs (all labs ordered are listed, but only abnormal results are displayed) Labs Reviewed  POCT RAPID STREP A (OFFICE)    EKG   Radiology No results found.  Procedures Procedures (including critical care time)  Medications Ordered in UC Medications  acetaminophen  (TYLENOL ) tablet 975 mg (has no administration in time range)    Initial Impression / Assessment and Plan / UC Course  I have reviewed the triage vital signs and the nursing notes.  Pertinent labs & imaging results that were available during my care of the patient were reviewed by me and considered in my medical decision making (see chart for details).     Patient is well-appearing.  Mild congestion and rhinorrhea are present.  Mild erythema and PND noted to pharynx.  Without cervical adenopathy present.  Rapid strep was negative.  Deferred sending culture due to presentation inconsistent with strep throat.  Given Tylenol  in clinic for headache.  Symptoms likely viral in nature.  Prescribed lidocaine  as needed for throat pain.  Recommended alternating between Tylenol  ibuprofen  as needed for sore throat and headache.  Discussed importance of hydration.  Discussed return precautions.  Upon RN discharging patient patient states that she needs a cough medication.  Patient initially denied cough with RN and myself during assessment.  Sent prescription for Tessalon . Final Clinical Impressions(s) / UC Diagnoses   Final diagnoses:  Viral pharyngitis     Discharge Instructions      Your strep test is negative.  I believe your symptoms are likely related to a viral illness. I have prescribed lidocaine  solution that you can gargle and spit as needed for  throat pain. Otherwise alternate between 650 mg of Tylenol  and 400 mg of ibuprofen  every 6-8 hours as needed for sore throat and headache. Make sure you are staying hydrated and getting plenty of rest. Return here for symptoms persist or worsen.    ED Prescriptions     Medication Sig Dispense Auth. Provider   lidocaine  (XYLOCAINE ) 2 % solution Use as directed 15 mLs in the mouth or throat as needed. 100 mL Levora Reas A, NP      PDMP not reviewed this encounter.   Karon Packer, NP 05/13/24 1840    Karon Packer, NP 05/13/24 1849    Karon Packer, NP 05/13/24 1850

## 2024-05-13 NOTE — ED Triage Notes (Signed)
 Sore throat started yesterday, has a headache and fullness on right side of neck and right side of neck hurts worse than left side of neck.  Denies cough or runny nose  No medicines taken today

## 2024-05-13 NOTE — Discharge Instructions (Addendum)
 Your strep test is negative.  I believe your symptoms are likely related to a viral illness. I have prescribed lidocaine  solution that you can gargle and spit as needed for throat pain. Otherwise alternate between 650 mg of Tylenol  and 400 mg of ibuprofen  every 6-8 hours as needed for sore throat and headache. Make sure you are staying hydrated and getting plenty of rest. Return here for symptoms persist or worsen.

## 2024-10-19 ENCOUNTER — Ambulatory Visit (HOSPITAL_COMMUNITY)
Admission: EM | Admit: 2024-10-19 | Discharge: 2024-10-19 | Disposition: A | Discharge status: Home / Self Care | Payer: MEDICAID | Attending: Family Medicine | Admitting: Family Medicine

## 2024-10-19 ENCOUNTER — Encounter (HOSPITAL_COMMUNITY): Payer: Self-pay | Admitting: Emergency Medicine

## 2024-10-19 ENCOUNTER — Emergency Department (HOSPITAL_COMMUNITY): Payer: MEDICAID

## 2024-10-19 ENCOUNTER — Other Ambulatory Visit: Payer: Self-pay

## 2024-10-19 ENCOUNTER — Encounter (HOSPITAL_COMMUNITY): Payer: Self-pay

## 2024-10-19 ENCOUNTER — Emergency Department (HOSPITAL_COMMUNITY)
Admission: EM | Admit: 2024-10-19 | Discharge: 2024-10-20 | Discharge status: Against Medical Advice | Payer: MEDICAID | Source: Ambulatory Visit | Attending: Emergency Medicine | Admitting: Emergency Medicine

## 2024-10-19 DIAGNOSIS — G43909 Migraine, unspecified, not intractable, without status migrainosus: Secondary | ICD-10-CM | POA: Insufficient documentation

## 2024-10-19 DIAGNOSIS — R519 Headache, unspecified: Secondary | ICD-10-CM | POA: Diagnosis not present

## 2024-10-19 DIAGNOSIS — Z5321 Procedure and treatment not carried out due to patient leaving prior to being seen by health care provider: Secondary | ICD-10-CM | POA: Insufficient documentation

## 2024-10-19 LAB — BASIC METABOLIC PANEL WITH GFR
Anion gap: 13 (ref 5–15)
BUN: <5 mg/dL — ABNORMAL LOW (ref 6–20)
CO2: 22 mmol/L (ref 22–32)
Calcium: 9.1 mg/dL (ref 8.9–10.3)
Chloride: 103 mmol/L (ref 98–111)
Creatinine, Ser: 0.75 mg/dL (ref 0.44–1.00)
GFR, Estimated: >60 mL/min (ref >60)
Glucose, Bld: 83 mg/dL (ref 70–99)
Potassium: 4 mmol/L (ref 3.5–5.1)
Sodium: 138 mmol/L (ref 135–145)

## 2024-10-19 LAB — CBC WITH DIFFERENTIAL/PLATELET
Abs Immature Granulocytes: 0.05 K/uL (ref 0.00–0.07)
Basophils Absolute: 0.1 K/uL (ref 0.0–0.1)
Basophils Relative: 1 %
Eosinophils Absolute: 1.2 K/uL — ABNORMAL HIGH (ref 0.0–0.5)
Eosinophils Relative: 11 %
HCT: 41.6 % (ref 36.0–46.0)
Hemoglobin: 13.5 g/dL (ref 12.0–15.0)
Immature Granulocytes: 1 %
Lymphocytes Relative: 30 %
Lymphs Abs: 3.1 K/uL (ref 0.7–4.0)
MCH: 30.1 pg (ref 26.0–34.0)
MCHC: 32.5 g/dL (ref 30.0–36.0)
MCV: 92.9 fL (ref 80.0–100.0)
Monocytes Absolute: 0.5 K/uL (ref 0.1–1.0)
Monocytes Relative: 5 %
Neutro Abs: 5.6 K/uL (ref 1.7–7.7)
Neutrophils Relative %: 52 %
Platelets: 437 K/uL — ABNORMAL HIGH (ref 150–400)
RBC: 4.48 MIL/uL (ref 3.87–5.11)
RDW: 15.2 % (ref 11.5–15.5)
WBC: 10.4 K/uL (ref 4.0–10.5)
nRBC: 0 % (ref 0.0–0.2)

## 2024-10-19 LAB — TROPONIN I (HIGH SENSITIVITY): Troponin I (High Sensitivity): 6 ng/L (ref <18)

## 2024-10-19 MED ORDER — ACETAMINOPHEN 500 MG PO TABS: 1000.0000 mg | ORAL_TABLET | Freq: Once | ORAL | Status: DC

## 2024-10-19 MED ORDER — ACETAMINOPHEN 325 MG PO TABS
ORAL_TABLET | ORAL | Status: AC
  Filled 2024-10-19: qty 3

## 2024-10-19 MED ORDER — ACETAMINOPHEN 325 MG PO TABS
975.0000 mg | ORAL_TABLET | Freq: Once | ORAL | Status: AC
  Administered 2024-10-19: 975 mg via ORAL

## 2024-10-19 NOTE — ED Notes (Signed)
 Pt was sleep under covers but still in wpt

## 2024-10-19 NOTE — Discharge Instructions (Addendum)
 Patient to ER via EMS for worsening of life

## 2024-10-19 NOTE — ED Notes (Signed)
 Called pt from lobby- told pt keeps going inside and outside.

## 2024-10-19 NOTE — ED Notes (Signed)
 [facility] made aware of patient of transport needed to ED.

## 2024-10-19 NOTE — ED Provider Triage Note (Signed)
 Emergency Medicine Provider Triage Evaluation Note  [name] , a 38 y.o. female  was evaluated in triage.  Pt complains of headache.  Patient complains of headache that has been ongoing for the past several months.  Worse in the morning when she wakes up.  She initially went to urgent care and was sent to the emergency department for further evaluation due to severe headache.  She has had no neck pain or fevers.  No nausea or vomiting, she endorses photophobia, no focal neurologic deficits.  Headache has been daily for the past few months.  Review of Systems  Positive: Headache, photophobia Negative: Nausea, vomiting, neck stiffness, fevers  Physical Exam  BP (!) 162/128 (BP Location: Left Arm)   Pulse 96   Temp 98.8 F (37.1 C)   Resp 18   Ht 5' 1 (1.549 m)   Wt 77.1 kg   SpO2 100%   BMI 32.12 kg/m  Gen:   Awake, no distress   HEENT: ROM of neck intact, no meningismus Resp:  Normal effort MSK:   Moves extremities without difficulty  Neuro:  No cranial nerve deficit, 5/5 strength all 4 extremities, intact sensation to light touch   Medical Decision Making  Medically screening exam initiated at 4:04 PM.  Appropriate orders placed.  [name] was informed that the remainder of the evaluation will be completed by another provider, this initial triage assessment does not replace that evaluation, and the importance of remaining in the ED until their evaluation is complete.  Given duration and chronicity of headache, will obtain CT imaging of the head to further evaluate, Tylenol  administered in triage, screening labs obtained to include troponin given patient hypertension, EKG obtained, no STEMI noted.   Jerrol Agent, MD 10/19/24 [phone]

## 2024-10-19 NOTE — ED Notes (Signed)
 Pt left AMA

## 2024-10-19 NOTE — ED Notes (Addendum)
 Pt called from waiting area for triage without response -- told pt stepped outside to get her drink.

## 2024-10-19 NOTE — ED Notes (Signed)
Report given to [facility]. 

## 2024-10-19 NOTE — ED Triage Notes (Signed)
 Pt BIB [facility] from [facility] d/t first BP for them was 80/50 & for EMS was 160/110, was initially at [facility] d/t a intermittent migraine for the past 5 months, A/Ox4, 80 bpm, 99% RA.

## 2024-10-19 NOTE — ED Notes (Signed)
 Called pt for vitals check.. no response.SABRA

## 2024-10-19 NOTE — ED Notes (Signed)
 Warren Naomi PEAK at [facility] ED made aware of patient coming via [facility].

## 2024-10-19 NOTE — ED Provider Notes (Signed)
 [facility]    CSN: [id number] Arrival date & time: 10/19/24  1210      History   Chief Complaint Chief Complaint  Patient presents with   Headache    HPI CHAMARA DYCK is a 38 y.o. female with a past medical history of asthma, schizophrenia, obesity, bipolar presents for headache.  Patient reports 5 months of a intermittent every other day global  headache that she describes as her brain is going to explode.  She currently has a headache at time of evaluation and rates it as a 10 out of 10 and states it is the worst headache of her life.  She endorses photophobia but denies nausea/vomiting, dizziness but does also states she has felt like she is going to pass out but denies any actual syncope.  No neck pain or fevers.  Denies history of headaches or migraines.  Denies family history of headaches or migraines.  Denies first-degree relative with [facility] but does state her uncle on her father side had a brain tumor.  Patient states symptoms began after her father passed.  She also endorses intermittent numbness and tingling in her hands.  She took Goody powder today with no improvement.   Headache Associated symptoms: photophobia     Past Medical History:  Diagnosis Date   Asthma    Chronic hypertension in pregnancy 05/12/2020   Diagnosed 5/27. Refused preeclampsia labs     Fetal echogenic intracardiac focus on prenatal ultrasound 02/24/2020   GBS (group B Streptococcus carrier), +RV culture, currently pregnant 05/23/2020   History of pre-eclampsia    Hx of gallstones    Hx successful VBAC (vaginal birth after cesarean), currently pregnant 02-24-20   Neonatal death in prior pregnancy, currently pregnant 05/29/2020   Patient reports baby was born premature and passed away in the hospital     Normal labor 05/29/2020   Obesity    Supervision of other high risk pregnancy, antepartum 03/09/2020              Nursing Staff    Provider      Office Location     [facility]                 Language      english    Anatomy US             Flu Vaccine          Genetic Screen     NIPS:   AFP:   First Screen:  Quad:          TDaP vaccine          Hgb A1C or   GTT    Early   Third trimester       Rhogam               LAB RESULTS                 Blood Type            Feeding Plan     breast / bottle    Antib   Trichomonal vaginitis during pregnancy in third trimester Feb 24, 2020   Tx 05/11/2020      Patient Active Problem List   Diagnosis Date Noted   Abscess of right breast 08/14/2023   Missed abortion 08/14/2023   Hypokalemia 08/14/2023   Depression Feb 24, 2020   Bipolar disorder (HCC) 02-24-2020   Hx of cesarean section Feb 24, 2020   Schizophrenia (HCC) 02/24/20   Obesity,  Class III, BMI 40-49.9 (morbid obesity) (HCC) 03/06/2017   Tobacco use 03/06/2017    Past Surgical History:  Procedure Laterality Date   BREAST BIOPSY Left 07/31/2013   Procedure: IRRIGATION AND DEBRIDMENT LEFT BREAST ABSCESS;  Surgeon: Redell Faith, DO;  Location: [facility];  Service: General;  Laterality: Left;  IRRIGATION AND DEBRIDEMENT LEFT BREAST ABSCESS   CESAREAN SECTION     CESAREAN SECTION  06/09/2022   Procedure: CESAREAN SECTION;  Surgeon: Zina Jerilynn LABOR, MD;  Location: [facility];  Service: Obstetrics;;    OB History     Gravida  7   Para  6   Term  2   Preterm  4   AB      Living  5      SAB      IAB      Ectopic      Multiple  0   Live Births  6            Home Medications    Prior to Admission medications   Medication Sig Start Date End Date Taking? Authorizing Provider  benzonatate  (TESSALON ) 100 MG capsule Take 1 capsule (100 mg total) by mouth every 8 (eight) hours. 05/13/24   Johnie Flaming A, NP  hydrocortisone  cream 1 % Apply 1 Application topically 2 (two) times daily. Don't apply to face. 05/11/24   Vicky Charleston, PA-C  lidocaine  (XYLOCAINE ) 2 % solution Use as directed 15 mLs in the mouth or throat as needed. 05/13/24   Johnie Flaming LABOR, NP     Family History Family History  Problem Relation Age of Onset   Diabetes Father     Social History Social History   Tobacco Use   Smoking status: Some Days    Types: Cigarettes, Cigars   Smokeless tobacco: Never   Tobacco comments:    rare   Vaping Use   Vaping status: Never Used  Substance Use Topics   Alcohol use: Yes   Drug use: Not Currently    Types: Cocaine     Allergies   Patient has no known allergies.   Review of Systems Review of Systems  Eyes:  Positive for photophobia.  Neurological:  Positive for headaches.     Physical Exam Triage Vital Signs ED Triage Vitals  Encounter Vitals Group     BP 10/19/24 1346 (!) 80/51     Girls Systolic BP Percentile --      Girls Diastolic BP Percentile --      Boys Systolic BP Percentile --      Boys Diastolic BP Percentile --      Pulse Rate 10/19/24 1346 94     Resp 10/19/24 1346 18     Temp 10/19/24 1346 98.4 F (36.9 C)     Temp Source 10/19/24 1346 Oral     SpO2 10/19/24 1346 93 %     Weight --      Height --      Head Circumference --      Peak Flow --      Pain Score 10/19/24 1345 10     Pain Loc --      Pain Education --      Exclude from Growth Chart --    No data found.  Updated Vital Signs BP (!) 80/51 (BP Location: Right Arm)   Pulse 94   Temp 98.4 F (36.9 C) (Oral)   Resp 18   SpO2 93%   Visual Acuity Right Eye  Distance:   Left Eye Distance:   Bilateral Distance:    Right Eye Near:   Left Eye Near:    Bilateral Near:     Physical Exam Vitals and nursing note reviewed.  Constitutional:      General: She is in acute distress.     Appearance: She is obese.     Comments: Patient sobbing  HENT:     Head: Normocephalic and atraumatic.  Eyes:     Extraocular Movements: Extraocular movements intact.     Conjunctiva/sclera: Conjunctivae normal.     Pupils: Pupils are equal, round, and reactive to light.  Cardiovascular:     Rate and Rhythm: Normal rate.  Pulmonary:      Effort: Pulmonary effort is normal.  Skin:    General: Skin is warm and dry.  Neurological:     General: No focal deficit present.     Mental Status: She is alert and oriented to person, place, and time.     GCS: GCS eye subscore is 4. GCS verbal subscore is 5. GCS motor subscore is 6.     Cranial Nerves: No facial asymmetry.     Motor: No weakness.     Coordination: Romberg sign positive. Finger-Nose-Finger Test normal.     Gait: Tandem walk abnormal.     Comments: Strength 5 out of 5 bilateral upper extremities  Psychiatric:     Comments: Patient crying throughout entire exam      UC Treatments / Results  Labs (all labs ordered are listed, but only abnormal results are displayed) Labs Reviewed - No data to display  EKG   Radiology No results found.  Procedures Procedures (including critical care time)  Medications Ordered in UC Medications  acetaminophen  (TYLENOL ) tablet 975 mg (975 mg Oral Given 10/19/24 1411)    Initial Impression / Assessment and Plan / UC Course  I have reviewed the triage vital signs and the nursing notes.  Pertinent labs & imaging results that were available during my care of the patient were reviewed by me and considered in my medical decision making (see chart for details).     I reviewed symptoms and exam with patient.  She is presenting with worst headache of life 10 out of 10.  Given this advised to go to the emergency room for further evaluation.  She is in agreement with plan and requested EMS transfer.  Was able to give her Tylenol  but advised cannot give her any NSAIDs until she is evaluated further and the underlying causes been identified.  In addition her blood pressure was low on intake at 80/51.  She denies history of hypotension and states she normally has hypertension.  Does not take any blood pressure medications.  Patient was taken to ER via EMS. Final Clinical Impressions(s) / UC Diagnoses   Final diagnoses:  Worst headache of  life     Discharge Instructions      Patient to ER via EMS for worsening of life     ED Prescriptions   None    PDMP not reviewed this encounter.   Loreda Myla SAUNDERS, NP 10/19/24 [phone]

## 2024-10-19 NOTE — ED Notes (Signed)
 Called [facility] for transport, has no trucks available anytime soon. They suggested we contact [facility] for transport, made Jodi, NP aware. This RN instructed to call [facility].

## 2024-10-19 NOTE — ED Triage Notes (Signed)
 Patient c/o HA's x 5 months, every other day.  Having body sweats.  Feels like her brain is about to explode.  Patient has taken Goody powder, denies any nausea or vomiting.

## 2024-10-26 ENCOUNTER — Encounter (HOSPITAL_COMMUNITY): Payer: Self-pay | Admitting: Emergency Medicine

## 2024-10-26 ENCOUNTER — Other Ambulatory Visit: Payer: Self-pay

## 2024-10-26 ENCOUNTER — Ambulatory Visit (HOSPITAL_COMMUNITY)
Admission: EM | Admit: 2024-10-26 | Discharge: 2024-10-26 | Disposition: A | Discharge status: Home / Self Care | Payer: MEDICAID | Attending: Family Medicine | Admitting: Family Medicine

## 2024-10-26 DIAGNOSIS — L239 Allergic contact dermatitis, unspecified cause: Secondary | ICD-10-CM | POA: Diagnosis not present

## 2024-10-26 DIAGNOSIS — H60502 Unspecified acute noninfective otitis externa, left ear: Secondary | ICD-10-CM | POA: Diagnosis not present

## 2024-10-26 DIAGNOSIS — H6692 Otitis media, unspecified, left ear: Secondary | ICD-10-CM | POA: Diagnosis not present

## 2024-10-26 MED ORDER — CETIRIZINE HCL 10 MG PO TABS: 10.0000 mg | ORAL_TABLET | Freq: Every day | ORAL | 0 refills | Status: AC | PRN

## 2024-10-26 MED ORDER — DEXAMETHASONE SOD PHOSPHATE PF 10 MG/ML IJ SOLN
10.0000 mg | Freq: Once | INTRAMUSCULAR | Status: AC
  Administered 2024-10-26: 10 mg via INTRAMUSCULAR

## 2024-10-26 MED ORDER — PREDNISONE 20 MG PO TABS: 40.0000 mg | ORAL_TABLET | Freq: Every day | ORAL | 0 refills | Status: AC

## 2024-10-26 MED ORDER — CEFDINIR 300 MG PO CAPS: 600.0000 mg | ORAL_CAPSULE | Freq: Every day | ORAL | 0 refills | Status: AC

## 2024-10-26 MED ORDER — OFLOXACIN 0.3 % OT SOLN: 5.0000 [drp] | Freq: Two times a day (BID) | OTIC | 0 refills | Status: AC

## 2024-10-26 MED ORDER — PROMETHAZINE-DM 6.25-15 MG/5ML PO SYRP: 5.0000 mL | ORAL_SOLUTION | Freq: Four times a day (QID) | ORAL | 0 refills | Status: AC | PRN

## 2024-10-26 NOTE — Discharge Instructions (Signed)
-  Floxin eardrops-5 drops in the affected ear 2 times daily for 7 days.  Take cefdinir 300 mg--2 capsules together daily for 7 days  You have been given a shot of dexamethasone  10 mg, steroid.  Take prednisone  20 mg--2 daily for 5 days  Zyrtec /cetirizine  10 mg tablet--take 1 daily as needed for allergy or itching.  You can also take Tylenol  as needed for pain and use Cepacol spray as needed for the throat pain.  You will tolerate cool fluids and soft cool foods the best.

## 2024-10-26 NOTE — ED Provider Notes (Signed)
 [facility]    CSN: [id number] Arrival date & time: 10/26/24  1638      History   Chief Complaint No chief complaint on file.   HPI [name] is a 38 y.o. female.   HPI Here for sore throat and left ear pain and itchy rash on her anterior neck and face.  Symptoms began 3 days ago.  She notes it is hard to touch her ear on the left and she saw green drainage from it yesterday.  Uncertain if she has had fever.  She has had some cough  She denies a history of asthma  She has had itching and rash on her neck and face.  NKDA   Past Medical History:  Diagnosis Date   Asthma    Chronic hypertension in pregnancy 05/12/2020   Diagnosed 5/27. Refused preeclampsia labs     Fetal echogenic intracardiac focus on prenatal ultrasound March 09, 2020   GBS (group B Streptococcus carrier), +RV culture, currently pregnant 05/23/2020   History of pre-eclampsia    Hx of gallstones    Hx successful VBAC (vaginal birth after cesarean), currently pregnant 03/09/20   Neonatal death in prior pregnancy, currently pregnant 05/29/2020   Patient reports baby was born premature and passed away in the hospital     Normal labor 05/29/2020   Obesity    Supervision of other high risk pregnancy, antepartum 03/09/2020              Nursing Staff    Provider      Office Location     [facility]                Language      english    Anatomy US             Flu Vaccine          Genetic Screen     NIPS:   AFP:   First Screen:  Quad:          TDaP vaccine          Hgb A1C or   GTT    Early   Third trimester       Rhogam               LAB RESULTS                 Blood Type            Feeding Plan     breast / bottle    Antib   Trichomonal vaginitis during pregnancy in third trimester 2020-03-09   Tx 05/11/2020      Patient Active Problem List   Diagnosis Date Noted   Abscess of right breast 08/14/2023   Missed abortion 08/14/2023   Hypokalemia 08/14/2023   Depression 03-09-2020   Bipolar  disorder (HCC) 03/09/2020   Hx of cesarean section 03/09/2020   Schizophrenia (HCC) 03/09/20   Obesity, Class III, BMI 40-49.9 (morbid obesity) (HCC) 03/06/2017   Tobacco use 03/06/2017    Past Surgical History:  Procedure Laterality Date   BREAST BIOPSY Left 07/31/2013   Procedure: IRRIGATION AND DEBRIDMENT LEFT BREAST ABSCESS;  Surgeon: Redell Faith, DO;  Location: [facility];  Service: General;  Laterality: Left;  IRRIGATION AND DEBRIDEMENT LEFT BREAST ABSCESS   CESAREAN SECTION     CESAREAN SECTION  06/09/2022   Procedure: CESAREAN SECTION;  Surgeon: Zina Jerilynn LABOR, MD;  Location: [facility];  Service: Obstetrics;;  OB History     Gravida  7   Para  6   Term  2   Preterm  4   AB      Living  5      SAB      IAB      Ectopic      Multiple  0   Live Births  6            Home Medications    Prior to Admission medications   Medication Sig Start Date End Date Taking? Authorizing Provider  cefdinir (OMNICEF) 300 MG capsule Take 2 capsules (600 mg total) by mouth daily for 7 days. 10/26/24 11/02/24 Yes Vonna Sharlet POUR, MD  cetirizine  (ZYRTEC  ALLERGY) 10 MG tablet Take 1 tablet (10 mg total) by mouth daily as needed for allergies. 10/26/24  Yes Darling Cieslewicz K, MD  ofloxacin (FLOXIN) 0.3 % OTIC solution Place 5 drops into both ears 2 (two) times daily for 7 days. 10/26/24 11/02/24 Yes Vonna Sharlet POUR, MD  predniSONE  (DELTASONE ) 20 MG tablet Take 2 tablets (40 mg total) by mouth daily with breakfast for 5 days. 10/26/24 10/31/24 Yes Vonna Sharlet POUR, MD  promethazine -dextromethorphan (PROMETHAZINE -DM) 6.25-15 MG/5ML syrup Take 5 mLs by mouth 4 (four) times daily as needed for cough. 10/26/24  Yes Vonna Sharlet POUR, MD  lidocaine  (XYLOCAINE ) 2 % solution Use as directed 15 mLs in the mouth or throat as needed. 05/13/24   Johnie Rumaldo LABOR, NP    Family History Family History  Problem Relation Age of Onset   Diabetes Father     Social  History Social History   Tobacco Use   Smoking status: Some Days    Types: Cigarettes, Cigars   Smokeless tobacco: Never   Tobacco comments:    rare   Vaping Use   Vaping status: Never Used  Substance Use Topics   Alcohol use: Yes   Drug use: Not Currently    Types: Cocaine     Allergies   Patient has no known allergies.   Review of Systems Review of Systems   Physical Exam Triage Vital Signs ED Triage Vitals  Encounter Vitals Group     BP 10/26/24 1814 (!) 150/100     Girls Systolic BP Percentile --      Girls Diastolic BP Percentile --      Boys Systolic BP Percentile --      Boys Diastolic BP Percentile --      Pulse Rate 10/26/24 1814 94     Resp 10/26/24 1814 18     Temp 10/26/24 1814 98.3 F (36.8 C)     Temp Source 10/26/24 1814 Oral     SpO2 10/26/24 1814 98 %     Weight --      Height --      Head Circumference --      Peak Flow --      Pain Score 10/26/24 1811 9     Pain Loc --      Pain Education --      Exclude from Growth Chart --    No data found.  Updated Vital Signs BP (!) 150/100 (BP Location: Right Arm) Comment (BP Location): large cuff  Pulse 94   Temp 98.3 F (36.8 C) (Oral)   Resp 18   LMP 10/12/2024 (Approximate)   SpO2 98%   Visual Acuity Right Eye Distance:   Left Eye Distance:   Bilateral Distance:    Right Eye  Near:   Left Eye Near:    Bilateral Near:     Physical Exam Vitals reviewed.  Constitutional:      General: She is not in acute distress.    Appearance: She is not ill-appearing, toxic-appearing or diaphoretic.  HENT:     Ears:     Comments: Left tympanic membrane is dull and there is some crusting in the ear canal.  Left tympanic membrane is obscured by copious gray and white discharge in the left ear canal.  There is some pain on traction of the left pinna.  The discharge is adherent to the canal walls    Nose: Nose normal.     Mouth/Throat:     Mouth: Mucous membranes are moist.     Comments:   There is some mild erythema of the posterior oropharynx.  Mucous membranes are moist and pink Eyes:     Extraocular Movements: Extraocular movements intact.     Conjunctiva/sclera: Conjunctivae normal.     Pupils: Pupils are equal, round, and reactive to light.  Cardiovascular:     Rate and Rhythm: Normal rate and regular rhythm.     Heart sounds: No murmur heard. Pulmonary:     Effort: No respiratory distress.     Breath sounds: No stridor. No wheezing, rhonchi or rales.  Musculoskeletal:     Cervical back: Neck supple.  Lymphadenopathy:     Cervical: No cervical adenopathy.  Skin:    Capillary Refill: Capillary refill takes less than 2 seconds.     Coloration: Skin is not jaundiced or pale.     Comments: There is an erythematous maculopapular rash on her neck and cheeks.  Neurological:     General: No focal deficit present.     Mental Status: She is alert and oriented to person, place, and time.  Psychiatric:        Behavior: Behavior normal.      UC Treatments / Results  Labs (all labs ordered are listed, but only abnormal results are displayed) Labs Reviewed - No data to display  EKG   Radiology No results found.  Procedures Procedures (including critical care time)  Medications Ordered in UC Medications  dexamethasone  (DECADRON ) injection 10 mg (10 mg Intramuscular Given 10/26/24 1837)    Initial Impression / Assessment and Plan / UC Course  I have reviewed the triage vital signs and the nursing notes.  Pertinent labs & imaging results that were available during my care of the patient were reviewed by me and considered in my medical decision making (see chart for details).     Floxin is sent in for what appears to be an external otitis, but I have also sent in Omnicef for possible acute otitis media.  She requested a cough syrup Suphedrine, dextromethorphan is sent in.  Decadron  is given for the allergic dermatitis and 5 days of prednisone  are sent in for  that.  She can take Tylenol  and use Cepacol for the throat pain. Final Clinical Impressions(s) / UC Diagnoses   Final diagnoses:  Acute otitis externa of left ear, unspecified type  Left otitis media, unspecified otitis media type  Allergic dermatitis     Discharge Instructions      -Floxin eardrops-5 drops in the affected ear 2 times daily for 7 days.  Take cefdinir 300 mg--2 capsules together daily for 7 days  You have been given a shot of dexamethasone  10 mg, steroid.  Take prednisone  20 mg--2 daily for 5 days  Zyrtec /cetirizine  10  mg tablet--take 1 daily as needed for allergy or itching.  You can also take Tylenol  as needed for pain and use Cepacol spray as needed for the throat pain.  You will tolerate cool fluids and soft cool foods the best.    ED Prescriptions     Medication Sig Dispense Auth. Provider   cefdinir (OMNICEF) 300 MG capsule Take 2 capsules (600 mg total) by mouth daily for 7 days. 14 capsule Harini Dearmond K, MD   ofloxacin (FLOXIN) 0.3 % OTIC solution Place 5 drops into both ears 2 (two) times daily for 7 days. 5 mL Vonna Sharlet POUR, MD   predniSONE  (DELTASONE ) 20 MG tablet Take 2 tablets (40 mg total) by mouth daily with breakfast for 5 days. 10 tablet Chrisandra Wiemers K, MD   promethazine -dextromethorphan (PROMETHAZINE -DM) 6.25-15 MG/5ML syrup Take 5 mLs by mouth 4 (four) times daily as needed for cough. 118 mL Vonna Sharlet POUR, MD   cetirizine  (ZYRTEC  ALLERGY) 10 MG tablet Take 1 tablet (10 mg total) by mouth daily as needed for allergies. 30 tablet Khamya Topp K, MD      PDMP not reviewed this encounter.   Vonna Sharlet POUR, MD 10/26/24 [phone]

## 2024-10-26 NOTE — ED Triage Notes (Signed)
 Sore throat, left ear pain, reports left ear is draining.  Patient has a red rash to chest, neck and face.  Denies new products for hair.  Patient thinks it is a new body spray she used recently.  Has not measured temperature  Has used an anti-itch cream, but did not help.
